# Patient Record
Sex: Female | Born: 1970 | Race: Black or African American | Hispanic: No | Smoking: Never smoker
Health system: Southern US, Community
[De-identification: ages and names within clinical notes are randomized; demographics above are authoritative.]

## PROBLEM LIST (undated history)

## (undated) DIAGNOSIS — G709 Myoneural disorder, unspecified: Secondary | ICD-10-CM

## (undated) DIAGNOSIS — I10 Essential (primary) hypertension: Secondary | ICD-10-CM

## (undated) DIAGNOSIS — F32A Depression, unspecified: Secondary | ICD-10-CM

## (undated) DIAGNOSIS — R51 Headache: Secondary | ICD-10-CM

## (undated) DIAGNOSIS — G35 Multiple sclerosis: Principal | ICD-10-CM

## (undated) DIAGNOSIS — M1990 Unspecified osteoarthritis, unspecified site: Secondary | ICD-10-CM

## (undated) DIAGNOSIS — N319 Neuromuscular dysfunction of bladder, unspecified: Secondary | ICD-10-CM

## (undated) DIAGNOSIS — F329 Major depressive disorder, single episode, unspecified: Secondary | ICD-10-CM

## (undated) DIAGNOSIS — R269 Unspecified abnormalities of gait and mobility: Secondary | ICD-10-CM

## (undated) DIAGNOSIS — R413 Other amnesia: Secondary | ICD-10-CM

## (undated) HISTORY — DX: Headache: R51

## (undated) HISTORY — DX: Unspecified abnormalities of gait and mobility: R26.9

## (undated) HISTORY — DX: Neuromuscular dysfunction of bladder, unspecified: N31.9

## (undated) HISTORY — PX: TUBAL LIGATION: SHX77

## (undated) HISTORY — DX: Multiple sclerosis: G35

## (undated) HISTORY — DX: Other amnesia: R41.3

## (undated) HISTORY — DX: Major depressive disorder, single episode, unspecified: F32.9

## (undated) HISTORY — DX: Depression, unspecified: F32.A

## (undated) NOTE — *Deleted (*Deleted)
Multiple Sclerosis Multiple sclerosis (MS) is a disease of the brain, spinal cord, and optic nerves (central nervous system). It causes the body's disease-fighting (immune) system to destroy the protective covering (myelin sheath) around nerves in the brain. When this happens, signals (nerve impulses) going to and from the brain and spinal cord do not get sent properly or may not get sent at all. There are several types of MS:  Relapsing-remitting MS. This is the most common type. This causes sudden attacks of symptoms. After an attack, you may recover completely until the next attack, or some symptoms may remain permanently.  Secondary progressive MS. This usually develops after the onset of relapsing-remitting MS. Similar to relapsing-remitting MS, this type also causes sudden attacks of symptoms. Attacks may be less frequent, but symptoms slowly get worse (progress) over time.  Primary progressive MS. This causes symptoms that steadily progress over time. This type of MS does not cause sudden attacks of symptoms. The age of onset of MS varies, but it often develops between 20-40 years of age. MS is a lifelong (chronic) condition. There is no cure, but treatment can help slow down the progression of the disease. What are the causes? The cause of this condition is not known. What increases the risk? You are more likely to develop this condition if:  You are a woman.  You have a relative with MS. However, the condition is not passed from parent to child (inherited).  You have a lack (deficiency) of vitamin D.  You smoke. MS is more common in the [place] than in the [place]. What are the signs or symptoms? Relapsing-remitting and secondary progressive MS cause symptoms to occur in episodes or attacks that may last weeks to months. There may be long periods between attacks in which there are almost no symptoms. Primary progressive MS causes symptoms to steadily  progress after they develop. Symptoms of MS vary because of the many different ways it affects the central nervous system. The main symptoms include:  Vision problems and eye pain.  Numbness.  Weakness.  Inability to move your arms, hands, feet, or legs (paralysis).  Balance problems.  Shaking that you cannot control (tremors).  Muscle spasms.  Problems with thinking (cognitive changes). MS can also cause symptoms that are associated with the disease, but are not always the direct result of an MS attack. They may include:  Inability to control urination or bowel movements (incontinence).  Headaches.  Fatigue.  Inability to tolerate heat.  Emotional changes.  Depression.  Pain. How is this diagnosed? This condition is diagnosed based on:  Your symptoms.  A neurological exam. This involves checking central nervous system function, such as nerve function, reflexes, and coordination.  MRIs of the brain and spinal cord.  Lab tests, including a lumbar puncture that tests the fluid that surrounds the brain and spinal cord (cerebrospinal fluid).  Tests to measure the electrical activity of the brain in response to stimulation (evoked potentials). How is this treated? There is no cure for MS, but medicines can help decrease the number and frequency of attacks and help relieve nuisance symptoms. Treatment options may include:  Medicines that reduce the frequency of attacks. These medicines may be given by injection, by mouth (orally), or through an IV.  Medicines that reduce inflammation (steroids). These may provide short-term relief of symptoms.  Medicines to help control pain, depression, fatigue, or incontinence.  Vitamin D, if you have a deficiency.  Using devices to help   you move around (assistive devices), such as braces, a cane, or a walker.  Physical therapy to strengthen and stretch your muscles.  Occupational therapy to help you with everyday  tasks.  Alternative or complementary treatments such as exercise, massage, or acupuncture. Follow these instructions at home:  Take over-the-counter and prescription medicines only as told by your health care provider.  Do not drive or use heavy machinery while taking prescription pain medicine.  Use assistive devices as recommended by your physical therapist or your health care provider.  Exercise as directed by your health care provider.  Return to your normal activities as told by your health care provider. Ask your health care provider what activities are safe for you.  Reach out for support. Share your feelings with friends, family, or a support group.  Keep all follow-up visits as told by your health care provider and therapists. This is important. Where to find more information  National Multiple Sclerosis Society: https://www.nationalmssociety.org Contact a health care provider if:  You feel depressed.  You develop new pain or numbness.  You have tremors.  You have problems with sexual function. Get help right away if:  You develop paralysis.  You develop numbness.  You have problems with your bladder or bowel function.  You develop double vision.  You lose vision in one or both eyes.  You develop suicidal thoughts.  You develop severe confusion. If you ever feel like you may hurt yourself or others, or have thoughts about taking your own life, get help right away. You can go to your nearest emergency department or call:  Your local emergency services (911 in the [place]  A suicide crisis helpline, such as the National Suicide Prevention Lifeline at 1-800-273-8255. This is open 24 hours a day. Summary  Multiple sclerosis (MS) is a disease of the central nervous system that causes the body's immune system to destroy the protective covering (myelin sheath) around nerves in the brain.  There are 3 types of MS: relapsing-remitting, secondary progressive, and  primary progressive. Relapsing-remitting and secondary progressive MS cause symptoms to occur in episodes or attacks that may last weeks to months. Primary progressive MS causes symptoms to steadily progress after they develop.  There is no cure for MS, but medicines can help decrease the number and frequency of attacks and help relieve nuisance symptoms. Treatment may also include physical or occupational therapy.  If you develop numbness, paralysis, vision problems, or other neurological symptoms, get help right away. This information is not intended to replace advice given to you by your health care provider. Make sure you discuss any questions you have with your health care provider. Document Revised: 07/06/2017 Document Reviewed: 10/02/2016 Elsevier Patient Education  2020 [employer]  

---

## 1990-08-07 DIAGNOSIS — G35 Multiple sclerosis: Secondary | ICD-10-CM

## 1990-08-07 HISTORY — DX: Multiple sclerosis: G35

## 2001-08-07 DIAGNOSIS — R519 Headache, unspecified: Secondary | ICD-10-CM

## 2001-08-07 HISTORY — DX: Headache, unspecified: R51.9

## 2002-08-07 HISTORY — PX: ABLATION COLPOCLESIS: SHX1118

## 2011-08-18 ENCOUNTER — Other Ambulatory Visit (INDEPENDENT_AMBULATORY_CARE_PROVIDER_SITE_OTHER): Payer: BC Managed Care – PPO

## 2011-08-18 ENCOUNTER — Ambulatory Visit (INDEPENDENT_AMBULATORY_CARE_PROVIDER_SITE_OTHER): Payer: BC Managed Care – PPO | Admitting: Internal Medicine

## 2011-08-18 ENCOUNTER — Encounter: Payer: Self-pay | Admitting: Internal Medicine

## 2011-08-18 VITALS — BP 122/76 | HR 64 | Temp 98.1°F | Resp 16 | Wt 202.0 lb

## 2011-08-18 DIAGNOSIS — Z113 Encounter for screening for infections with a predominantly sexual mode of transmission: Secondary | ICD-10-CM | POA: Insufficient documentation

## 2011-08-18 DIAGNOSIS — Z1231 Encounter for screening mammogram for malignant neoplasm of breast: Secondary | ICD-10-CM

## 2011-08-18 DIAGNOSIS — Z23 Encounter for immunization: Secondary | ICD-10-CM

## 2011-08-18 DIAGNOSIS — Z0000 Encounter for general adult medical examination without abnormal findings: Secondary | ICD-10-CM

## 2011-08-18 LAB — LIPID PANEL
LDL Cholesterol: 103 mg/dL — ABNORMAL HIGH (ref 0–99)
Total CHOL/HDL Ratio: 3
Triglycerides: 112 mg/dL (ref 0.0–149.0)

## 2011-08-18 LAB — CBC WITH DIFFERENTIAL/PLATELET
Basophils Absolute: 0 10*3/uL (ref 0.0–0.1)
Eosinophils Relative: 1.6 % (ref 0.0–5.0)
Hemoglobin: 12.1 g/dL (ref 12.0–15.0)
Lymphocytes Relative: 13.5 % (ref 12.0–46.0)
Monocytes Relative: 14.1 % — ABNORMAL HIGH (ref 3.0–12.0)
Neutro Abs: 1.8 10*3/uL (ref 1.4–7.7)
RBC: 3.79 Mil/uL — ABNORMAL LOW (ref 3.87–5.11)
RDW: 14.4 % (ref 11.5–14.6)
WBC: 2.6 10*3/uL — ABNORMAL LOW (ref 4.5–10.5)

## 2011-08-18 LAB — COMPREHENSIVE METABOLIC PANEL
ALT: 33 U/L (ref 0–35)
CO2: 28 mEq/L (ref 19–32)
Calcium: 9.5 mg/dL (ref 8.4–10.5)
Chloride: 107 mEq/L (ref 96–112)
Creatinine, Ser: 1.1 mg/dL (ref 0.4–1.2)
GFR: 61.01 mL/min (ref >60.00)
Total Protein: 6.7 g/dL (ref 6.0–8.3)

## 2011-08-18 LAB — TSH: TSH: 1.8 u[IU]/mL (ref 0.35–5.50)

## 2011-08-18 NOTE — Assessment & Plan Note (Signed)
Exam done, labs ordered, referred for a mammogram, pt ed material was given, vaccines were updated

## 2011-08-18 NOTE — Progress Notes (Signed)
  Subjective:    Patient ID: Connie Robertson, female    DOB: 05/25/1971, 41 y.o.   MRN: [id number]  HPI New to me for a physical, she has multiple symptoms as a result of MS but she does not have ane new or different symptoms today.   Review of Systems  Constitutional: Positive for fatigue and unexpected weight change (weight gain). Negative for fever, chills, activity change and appetite change.  HENT: Negative.   Eyes: Negative.   Respiratory: Negative for cough, chest tightness, shortness of breath, wheezing and stridor.   Cardiovascular: Negative for chest pain, palpitations and leg swelling.  Gastrointestinal: Negative.   Genitourinary: Negative.   Musculoskeletal: Negative.   Neurological: Positive for numbness. Negative for dizziness, tremors, seizures, syncope, facial asymmetry, speech difficulty, weakness, light-headedness and headaches.  Hematological: Negative for adenopathy. Does not bruise/bleed easily.  Psychiatric/Behavioral: Negative.        Objective:   Physical Exam  Vitals reviewed. Constitutional: She is oriented to person, place, and time. She appears well-developed and well-nourished. No distress.  HENT:  Head: Normocephalic and atraumatic.  Nose: Nose normal.  Mouth/Throat: Oropharynx is clear and moist. No oropharyngeal exudate.  Eyes: Conjunctivae are normal. Right eye exhibits no discharge. Left eye exhibits no discharge. No scleral icterus.  Neck: Normal range of motion. Neck supple. No JVD present. No tracheal deviation present. No thyromegaly present.  Cardiovascular: Normal rate, regular rhythm, normal heart sounds and intact distal pulses.  Exam reveals no gallop and no friction rub.   No murmur heard. Pulmonary/Chest: Effort normal and breath sounds normal. No accessory muscle usage or stridor. Not tachypneic. No respiratory distress. She has no decreased breath sounds. She has no wheezes. She has no rhonchi. She has no rales. She exhibits no  tenderness. Right breast exhibits no inverted nipple, no mass, no nipple discharge, no skin change and no tenderness. Left breast exhibits no inverted nipple, no mass, no nipple discharge, no skin change and no tenderness. Breasts are symmetrical.  Abdominal: Soft. Bowel sounds are normal. She exhibits no distension and no mass. There is no tenderness. There is no rebound and no guarding.  Musculoskeletal: Normal range of motion. She exhibits no edema and no tenderness.  Lymphadenopathy:    She has no cervical adenopathy.  Neurological: She is oriented to person, place, and time.  Skin: Skin is warm and dry. No rash noted. She is not diaphoretic. No erythema. No pallor.  Psychiatric: She has a normal mood and affect. Her behavior is normal. Judgment and thought content normal.          Assessment & Plan:

## 2011-08-18 NOTE — Patient Instructions (Signed)

## 2011-08-19 ENCOUNTER — Encounter: Payer: Self-pay | Admitting: Internal Medicine

## 2011-09-19 ENCOUNTER — Emergency Department (INDEPENDENT_AMBULATORY_CARE_PROVIDER_SITE_OTHER)
Admission: EM | Admit: 2011-09-19 | Discharge: 2011-09-19 | Disposition: A | Discharge status: Home / Self Care | Payer: BC Managed Care – PPO | Source: Home / Self Care | Attending: Family Medicine | Admitting: Family Medicine

## 2011-09-19 ENCOUNTER — Encounter (HOSPITAL_COMMUNITY): Payer: Self-pay | Admitting: *Deleted

## 2011-09-19 DIAGNOSIS — I10 Essential (primary) hypertension: Secondary | ICD-10-CM

## 2011-09-19 MED ORDER — HYDROCODONE-ACETAMINOPHEN 5-500 MG PO TABS: 1.0000 | ORAL_TABLET | Freq: Four times a day (QID) | ORAL | Status: AC | PRN

## 2011-09-19 NOTE — ED Provider Notes (Signed)
History     CSN: [id number]  Arrival date & time 09/19/11  1324   First MD Initiated Contact with Patient 09/19/11 2045      Chief Complaint  Patient presents with  . Hypertension    (Consider location/radiation/quality/duration/timing/severity/associated sxs/prior treatment) HPI Comments:  41 year old female with history of multiple sclerosis and depression here concerned as she was told her blood pressure was high when she tried to donate blood today. Has never been diagnosed with high blood pressure before and has never taken high blood pressure medications in the past. Denies new medications currently taking fingolimod prescribed by Dr. Sandria Manly  her neurologist in town for multiple sclerosis. Reports she has felt lightheaded and has had diffuse headache today. But that is not uncommon for her who has a history of recurrent headaches. Reports that no medications help her with her pain and her neurologist has tried several combinations including narcotics and she has high tolerance for pain medications. She is not interested in pain medicine prescription here. Denies numbness paresthesia in her extremities from what is baseline for her denies weakness balance problems, or visual changes. Denies nausea or vomiting. Denies chest pain leg swelling or PND.   Past Medical History  Diagnosis Date  . MS (multiple sclerosis) 1992    Dr. Sandria Manly  . Depression     Past Surgical History  Procedure Date  . Tubal ligation   . Ablation colpoclesis 2004    Family History  Problem Relation Age of Onset  . Heart failure Father   . Multiple sclerosis Sister   . Cancer Neg Hx   . Diabetes Neg Hx   . Hyperlipidemia Neg Hx   . Hypertension Neg Hx   . Kidney disease Neg Hx   . Stroke Neg Hx     History  Substance Use Topics  . Smoking status: Never Smoker   . Smokeless tobacco: Never Used  . Alcohol Use: 4.2 oz/week    7 Glasses of wine per week    OB History    Grav Para Term Preterm  Abortions TAB SAB Ect Mult Living                  Review of Systems  Constitutional: Negative for fever and appetite change.  HENT: Negative for tinnitus.   Respiratory: Negative for cough and shortness of breath.   Cardiovascular: Negative for chest pain, palpitations and leg swelling.  Genitourinary: Negative for dysuria.  Neurological: Positive for headaches. Negative for dizziness, tremors, seizures, syncope, facial asymmetry, speech difficulty, weakness, light-headedness and numbness.    Allergies  Review of patient's allergies indicates no known allergies.  Home Medications   Current Outpatient Rx  Name Route Sig Dispense Refill  . GILENYA PO Oral Take by mouth.      BP 151/101  Pulse 64  Temp(Src) 98.7 F (37.1 C) (Oral)  Resp 18  SpO2 100%  Physical Exam  Nursing note and vitals reviewed. Constitutional: She is oriented to person, place, and time. She appears well-developed and well-nourished. No distress.  HENT:  Head: Normocephalic and atraumatic.  Eyes: Conjunctivae and EOM are normal. Pupils are equal, round, and reactive to light.  Neck: No JVD present.  Cardiovascular: Normal heart sounds.   Pulmonary/Chest: Breath sounds normal.  Neurological: She is alert and oriented to person, place, and time. She has normal strength. She displays normal reflexes. No cranial nerve deficit. She exhibits normal muscle tone. She displays a negative Romberg sign. Coordination normal.  Visual fields normal compared with mine.    ED Course  Procedures (including critical care time)  Labs Reviewed - No data to display No results found.   1. High blood pressure       MDM  Stage I HTN values today, Reccommended low sodium diet, weight management, reestablish care with a PCP to monitor blood pressure patient has been seen in the past by Dr. Kriste Basque.         Sharin Grave, MD 09/21/11 1149

## 2011-09-19 NOTE — ED Notes (Signed)
pT  REPORTS  SHE  WAS  TOLD  HER BP  WAS  HIGH  WHEN SHE  TRIED  TO DONATE  BLOOD  TODAY  SHE  HAS  MS      -  SHE  REPORTS  FEELINHG  LIGHTHEADED  AND  DIZZY    SHE  IS  AWAKE  AND  ALERT    AND  ORIENTED      AMBULATED S  WITH A  FLUID  STEADY GAIT

## 2011-09-20 ENCOUNTER — Encounter: Payer: Self-pay | Admitting: Internal Medicine

## 2011-09-20 ENCOUNTER — Ambulatory Visit (INDEPENDENT_AMBULATORY_CARE_PROVIDER_SITE_OTHER): Payer: BC Managed Care – PPO | Admitting: Internal Medicine

## 2011-09-20 VITALS — BP 138/90 | HR 67 | Temp 98.1°F | Resp 16 | Wt 211.0 lb

## 2011-09-20 DIAGNOSIS — I10 Essential (primary) hypertension: Secondary | ICD-10-CM

## 2011-09-20 MED ORDER — NEBIVOLOL HCL 5 MG PO TABS: 5.0000 mg | ORAL_TABLET | Freq: Every day | ORAL | Status: DC

## 2011-09-20 NOTE — Progress Notes (Signed)
  Subjective:    Patient ID: Connie Robertson, female    DOB: 03/15/1971, 41 y.o.   MRN: [id number]  Hypertension This is a chronic problem. The current episode started more than 1 month ago. The problem is unchanged. The problem is uncontrolled. Associated symptoms include headaches. Pertinent negatives include no anxiety, blurred vision, chest pain, malaise/fatigue, neck pain, orthopnea, palpitations, peripheral edema, PND, shortness of breath or sweats. There are no associated agents to hypertension. Past treatments include nothing. There are no compliance problems.       Review of Systems  Constitutional: Negative for fever, chills, malaise/fatigue, diaphoresis, activity change, appetite change, fatigue and unexpected weight change.  HENT: Negative for neck pain.   Eyes: Negative.  Negative for blurred vision.  Respiratory: Negative for cough, choking, chest tightness, shortness of breath, wheezing and stridor.   Cardiovascular: Negative for chest pain, palpitations, orthopnea, leg swelling and PND.  Gastrointestinal: Negative.   Genitourinary: Negative.   Musculoskeletal: Negative.   Skin: Negative.   Neurological: Positive for headaches. Negative for dizziness, tremors, seizures, syncope, facial asymmetry, speech difficulty and light-headedness.  Hematological: Negative for adenopathy. Does not bruise/bleed easily.  Psychiatric/Behavioral: Negative.        Objective:   Physical Exam  Vitals reviewed. Constitutional: She is oriented to person, place, and time. She appears well-developed and well-nourished. No distress.  HENT:  Head: Normocephalic and atraumatic.  Mouth/Throat: Oropharynx is clear and moist. No oropharyngeal exudate.  Eyes: Conjunctivae are normal. Right eye exhibits no discharge. Left eye exhibits no discharge. No scleral icterus.  Neck: Normal range of motion. Neck supple. No JVD present. No tracheal deviation present. No thyromegaly present.  Cardiovascular:  Normal rate, regular rhythm and intact distal pulses.  Exam reveals no gallop and no friction rub.   No murmur heard. Pulmonary/Chest: Effort normal and breath sounds normal. No stridor. No respiratory distress. She has no wheezes. She has no rales. She exhibits no tenderness.  Abdominal: Soft. Bowel sounds are normal. She exhibits no distension and no mass. There is no tenderness. There is no rebound and no guarding.  Musculoskeletal: Normal range of motion. She exhibits no edema and no tenderness.  Lymphadenopathy:    She has no cervical adenopathy.  Neurological: She is oriented to person, place, and time.  Skin: Skin is warm and dry. No rash noted. She is not diaphoretic. No erythema. No pallor.  Psychiatric: She has a normal mood and affect. Her behavior is normal. Judgment and thought content normal.      Lab Results  Component Value Date   WBC 2.6* 08/18/2011   HGB 12.1 08/18/2011   HCT 34.7* 08/18/2011   PLT 163.0 08/18/2011   GLUCOSE 91 08/18/2011   CHOL 191 08/18/2011   TRIG 112.0 08/18/2011   HDL 66.10 08/18/2011   LDLCALC 103* 08/18/2011   ALT 33 08/18/2011   AST 28 08/18/2011   NA 142 08/18/2011   K 4.3 08/18/2011   CL 107 08/18/2011   CREATININE 1.1 08/18/2011   BUN 19 08/18/2011   CO2 28 08/18/2011   TSH 1.80 08/18/2011      Assessment & Plan:

## 2011-09-20 NOTE — Patient Instructions (Signed)

## 2011-09-21 ENCOUNTER — Encounter: Payer: Self-pay | Admitting: Internal Medicine

## 2011-09-21 ENCOUNTER — Ambulatory Visit: Payer: BC Managed Care – PPO | Admitting: Internal Medicine

## 2011-09-21 NOTE — Assessment & Plan Note (Signed)
Start bystolic 

## 2011-09-22 ENCOUNTER — Ambulatory Visit: Payer: BC Managed Care – PPO

## 2011-09-28 ENCOUNTER — Ambulatory Visit (INDEPENDENT_AMBULATORY_CARE_PROVIDER_SITE_OTHER): Payer: BC Managed Care – PPO | Admitting: Endocrinology

## 2011-09-28 ENCOUNTER — Encounter: Payer: Self-pay | Admitting: Endocrinology

## 2011-09-28 DIAGNOSIS — I10 Essential (primary) hypertension: Secondary | ICD-10-CM

## 2011-09-28 NOTE — Progress Notes (Signed)
  Subjective:    Patient ID: Connie Robertson, female    DOB: [birth year], 41 y.o.   MRN: [id number]  HPI Pt was recently dx'ed with HTN.  She takes bystolic as rx'ed.  Denies sob.   Past Medical History  Diagnosis Date  . MS (multiple sclerosis) 1992    Dr. Sandria Manly  . Depression   . Chronic daily headache 2003    Love    Past Surgical History  Procedure Date  . Tubal ligation   . Ablation colpoclesis 2004    History   Social History  . Marital Status: Married    Spouse Name: N/A    Number of Children: N/A  . Years of Education: N/A   Occupational History  . Not on file.   Social History Main Topics  . Smoking status: Never Smoker   . Smokeless tobacco: Never Used  . Alcohol Use: 4.2 oz/week    7 Glasses of wine per week  . Drug Use: No  . Sexually Active: Not Currently    Birth Control/ Protection: Surgical   Other Topics Concern  . Not on file   Social History Narrative  . No narrative on file    Current Outpatient Prescriptions on File Prior to Visit  Medication Sig Dispense Refill  . Fingolimod HCl (GILENYA PO) Take by mouth.      Marland Kitchen HYDROcodone-acetaminophen (VICODIN) 5-500 MG per tablet Take 1-2 tablets by mouth every 6 (six) hours as needed for pain.  10 tablet  0  . nebivolol (BYSTOLIC) 5 MG tablet Take 1 tablet (5 mg total) by mouth daily.  70 tablet  0    No Known Allergies  Family History  Problem Relation Age of Onset  . Heart failure Father   . Multiple sclerosis Sister   . Cancer Neg Hx   . Diabetes Neg Hx   . Hyperlipidemia Neg Hx   . Hypertension Neg Hx   . Kidney disease Neg Hx   . Stroke Neg Hx     BP 142/88  Pulse 108  Temp(Src) 98.3 F (36.8 C) (Oral)  Ht 5\' 5"  (1.651 m)  Wt 212 lb 12.8 oz (96.525 kg)  BMI 35.41 kg/m2  SpO2 99%   Review of Systems Denies chest pain    Objective:   Physical Exam VITAL SIGNS:  See vs page GENERAL: no distress Ext: no edema     Assessment & Plan:  HTN, needs increased rx

## 2011-09-28 NOTE — Patient Instructions (Addendum)
Increase bystolic to 10 mg daily.   Please see dr Yetta Barre in 2 weeks.

## 2011-10-10 ENCOUNTER — Ambulatory Visit: Payer: BC Managed Care – PPO

## 2011-10-12 ENCOUNTER — Ambulatory Visit: Payer: BC Managed Care – PPO | Admitting: Internal Medicine

## 2011-10-13 ENCOUNTER — Ambulatory Visit: Payer: BC Managed Care – PPO | Admitting: Internal Medicine

## 2011-10-13 DIAGNOSIS — Z0289 Encounter for other administrative examinations: Secondary | ICD-10-CM

## 2011-10-18 ENCOUNTER — Ambulatory Visit: Payer: BC Managed Care – PPO

## 2011-11-17 ENCOUNTER — Ambulatory Visit (INDEPENDENT_AMBULATORY_CARE_PROVIDER_SITE_OTHER): Payer: BC Managed Care – PPO | Admitting: Internal Medicine

## 2011-11-17 ENCOUNTER — Encounter: Payer: Self-pay | Admitting: Internal Medicine

## 2011-11-17 VITALS — BP 120/84 | HR 82 | Temp 98.8°F | Resp 16 | Wt 216.0 lb

## 2011-11-17 DIAGNOSIS — R232 Flushing: Secondary | ICD-10-CM

## 2011-11-17 DIAGNOSIS — I10 Essential (primary) hypertension: Secondary | ICD-10-CM

## 2011-11-17 DIAGNOSIS — G44221 Chronic tension-type headache, intractable: Secondary | ICD-10-CM | POA: Insufficient documentation

## 2011-11-17 DIAGNOSIS — G44229 Chronic tension-type headache, not intractable: Secondary | ICD-10-CM

## 2011-11-17 DIAGNOSIS — N951 Menopausal and female climacteric states: Secondary | ICD-10-CM

## 2011-11-17 MED ORDER — NORTRIPTYLINE HCL 10 MG PO CAPS: 10.0000 mg | ORAL_CAPSULE | Freq: Every day | ORAL | Status: DC

## 2011-11-17 NOTE — Progress Notes (Signed)
Subjective:    Patient ID: Connie Robertson, female    DOB: [birth year], 41 y.o.   MRN: [id number]  Hypertension This is a chronic problem. The current episode started more than 1 year ago. The problem has been gradually improving since onset. Associated symptoms include headaches. Pertinent negatives include no anxiety, blurred vision, chest pain, malaise/fatigue, neck pain, orthopnea, palpitations, peripheral edema, PND, shortness of breath or sweats. Past treatments include beta blockers. The current treatment provides significant improvement. Compliance problems include exercise and diet.   Headache  This is a chronic problem. Episode onset: 10 years ago. The problem occurs daily. The problem has been unchanged. The pain is located in the bilateral, temporal and frontal region. The pain does not radiate. The pain quality is similar to prior headaches. The quality of the pain is described as squeezing. The pain is at a severity of 2/10. The pain is mild. Associated symptoms include insomnia. Pertinent negatives include no abdominal pain, abnormal behavior, anorexia, back pain, blurred vision, coughing, dizziness, drainage, ear pain, eye pain, eye redness, facial sweating, fever, hearing loss, loss of balance, muscle aches, nausea, neck pain, numbness, phonophobia, photophobia, rhinorrhea, scalp tenderness, seizures, sinus pressure, sore throat, swollen glands, tingling, tinnitus, visual change, vomiting, weakness or weight loss. The symptoms are aggravated by emotional stress. Treatments tried: many, given to her by a neurologist, she does know any of them today. The treatment provided no relief. Her past medical history is significant for hypertension.      Review of Systems  Constitutional: Positive for chills (hot flashes). Negative for fever, weight loss, malaise/fatigue, diaphoresis, activity change, appetite change, fatigue and unexpected weight change.  HENT: Negative for hearing loss, ear  pain, congestion, sore throat, facial swelling, rhinorrhea, neck pain, neck stiffness, sinus pressure and tinnitus.   Eyes: Negative.  Negative for blurred vision, photophobia, pain and redness.  Respiratory: Negative for apnea, cough, choking, chest tightness, shortness of breath, wheezing and stridor.   Cardiovascular: Negative for chest pain, palpitations, orthopnea and PND.  Gastrointestinal: Negative for nausea, vomiting, abdominal pain and anorexia.  Musculoskeletal: Negative for myalgias, back pain, joint swelling, arthralgias and gait problem.  Skin: Negative for color change, pallor, rash and wound.  Neurological: Positive for headaches. Negative for dizziness, tingling, tremors, seizures, syncope, facial asymmetry, speech difficulty, weakness, light-headedness, numbness and loss of balance.  Hematological: Negative for adenopathy. Does not bruise/bleed easily.  Psychiatric/Behavioral: Positive for sleep disturbance. Negative for suicidal ideas, hallucinations, behavioral problems, confusion, self-injury, dysphoric mood, decreased concentration and agitation. The patient has insomnia. The patient is not nervous/anxious and is not hyperactive.        Objective:   Physical Exam  Vitals reviewed. Constitutional: She is oriented to person, place, and time. She appears well-developed and well-nourished. No distress.  HENT:  Head: Normocephalic and atraumatic.  Mouth/Throat: Oropharynx is clear and moist. No oropharyngeal exudate.  Eyes: Conjunctivae and EOM are normal. Pupils are equal, round, and reactive to light. Right eye exhibits no discharge. Left eye exhibits no discharge. No scleral icterus.  Neck: Normal range of motion. Neck supple. No JVD present. No tracheal deviation present. No thyromegaly present.  Cardiovascular: Normal rate, normal heart sounds and intact distal pulses.  Exam reveals no gallop and no friction rub.   No murmur heard. Pulmonary/Chest: Effort normal and  breath sounds normal. No stridor. No respiratory distress. She has no wheezes. She has no rales. She exhibits no tenderness.  Abdominal: Soft. Bowel sounds are normal. She exhibits no distension and  no mass. There is no tenderness. There is no rebound and no guarding.  Musculoskeletal: Normal range of motion. She exhibits no edema and no tenderness.  Lymphadenopathy:    She has no cervical adenopathy.  Neurological: She is alert and oriented to person, place, and time. She has normal reflexes. She displays normal reflexes. No cranial nerve deficit. She exhibits normal muscle tone. Coordination normal.  Skin: Skin is warm and dry. No rash noted. She is not diaphoretic. No erythema. No pallor.  Psychiatric: She has a normal mood and affect. Her behavior is normal. Judgment and thought content normal.      Lab Results  Component Value Date   WBC 2.6* 08/18/2011   HGB 12.1 08/18/2011   HCT 34.7* 08/18/2011   PLT 163.0 08/18/2011   GLUCOSE 91 08/18/2011   CHOL 191 08/18/2011   TRIG 112.0 08/18/2011   HDL 66.10 08/18/2011   LDLCALC 103* 08/18/2011   ALT 33 08/18/2011   AST 28 08/18/2011   NA 142 08/18/2011   K 4.3 08/18/2011   CL 107 08/18/2011   CREATININE 1.1 08/18/2011   BUN 19 08/18/2011   CO2 28 08/18/2011   TSH 1.80 08/18/2011      Assessment & Plan:

## 2011-11-17 NOTE — Assessment & Plan Note (Signed)
I will check her labs today to look for menopause, thyroid disease, inflammation, anemia, etc.

## 2011-11-17 NOTE — Patient Instructions (Signed)
Hypertension As your heart beats, it forces blood through your arteries. This force is your blood pressure. If the pressure is too high, it is called hypertension (HTN) or high blood pressure. HTN is dangerous because you may have it and not know it. High blood pressure may mean that your heart has to work harder to pump blood. Your arteries may be narrow or stiff. The extra work puts you at risk for heart disease, stroke, and other problems.  Blood pressure consists of two numbers, a higher number over a lower, 110/72, for example. It is stated as "110 over 72." The ideal is below 120 for the top number (systolic) and under 80 for the bottom (diastolic). Write down your blood pressure today. You should pay close attention to your blood pressure if you have certain conditions such as:  Heart failure.   Prior heart attack.   Diabetes   Chronic kidney disease.   Prior stroke.   Multiple risk factors for heart disease.  To see if you have HTN, your blood pressure should be measured while you are seated with your arm held at the level of the heart. It should be measured at least twice. A one-time elevated blood pressure reading (especially in the Emergency Department) does not mean that you need treatment. There may be conditions in which the blood pressure is different between your right and left arms. It is important to see your caregiver soon for a recheck. Most people have essential hypertension which means that there is not a specific cause. This type of high blood pressure may be lowered by changing lifestyle factors such as:  Stress.   Smoking.   Lack of exercise.   Excessive weight.   Drug/tobacco/alcohol use.   Eating less salt.  Most people do not have symptoms from high blood pressure until it has caused damage to the body. Effective treatment can often prevent, delay or reduce that damage. TREATMENT  When a cause has been identified, treatment for high blood pressure is  directed at the cause. There are a large number of medications to treat HTN. These fall into several categories, and your caregiver will help you select the medicines that are best for you. Medications may have side effects. You should review side effects with your caregiver. If your blood pressure stays high after you have made lifestyle changes or started on medicines,   Your medication(s) may need to be changed.   Other problems may need to be addressed.   Be certain you understand your prescriptions, and know how and when to take your medicine.   Be sure to follow up with your caregiver within the time frame advised (usually within two weeks) to have your blood pressure rechecked and to review your medications.   If you are taking more than one medicine to lower your blood pressure, make sure you know how and at what times they should be taken. Taking two medicines at the same time can result in blood pressure that is too low.  SEEK IMMEDIATE MEDICAL CARE IF:  You develop a severe headache, blurred or changing vision, or confusion.   You have unusual weakness or numbness, or a faint feeling.   You have severe chest or abdominal pain, vomiting, or breathing problems.  MAKE SURE YOU:   Understand these instructions.   Will watch your condition.   Will get help right away if you are not doing well or get worse.  Document Released: 07/24/2005 Document Revised: 07/13/2011 Document Reviewed:   03/13/2008 [employer] Patient Information 2012 [place], [place].Tension Headache (Muscle Contraction Headache) Tension headache is one of the most common causes of head pain. These headaches are usually felt as a pain over the top of your head and back of your neck. Stress, anxiety, and depression are common triggers for these headaches. Tension headaches are not life-threatening and will not lead to other types of headaches. Tension headaches can often be diagnosed by taking a history from the patient and  a physical exam. Sometimes, further lab and x-ray studies are used to confirm the diagnosis. Your caregiver can advise you on how to get help solving problems that cause anxiety or stress. Antidepressants can be prescribed if depression is a problem. HOME CARE INSTRUCTIONS   If testing was done, call for your results. Remember, it is your responsibility to get the results of all testing. Do not assume everything is fine because you do not hear from your caregiver.   Only take over-the-counter or prescription medicines for pain, discomfort, or fever as directed by your caregiver.   Biofeedback, massage, or other relaxation techniques may be helpful.   Ice packs or heat to the head and neck can be used. Use these three to four times per day or as needed.   Physical therapy may be a useful addition to treatment.   If headaches continue, even with therapy, you may need to think about lifestyle changes.   Avoid excessive use of pain killers, as rebound headaches can occur.  SEEK MEDICAL CARE IF:   You develop problems with medications prescribed.   You do not respond or get no relief from medications.   You have a change from the usual headache.   You develop nausea (feeling sick to your stomach) or vomiting.  SEEK IMMEDIATE MEDICAL CARE IF:   Your headache becomes severe.   You have an unexplained oral temperature above 102 F (38.9 C).   You develop a stiff neck.   You have loss of vision.   You have muscular weakness.   You have loss of muscular control.   You develop severe symptoms different from your first symptoms.   You start losing your balance or have trouble walking.   You feel faint or pass out.  MAKE SURE YOU:   Understand these instructions.   Will watch your condition.   Will get help right away if you are not doing well or get worse.  Document Released: 07/24/2005 Document Revised: 07/13/2011 Document Reviewed: 03/12/2008 [facility] Patient Information  2012 [place], [place].

## 2011-11-17 NOTE — Assessment & Plan Note (Signed)
Her BP is well controlled, I will check her lytes and renal function today 

## 2011-11-17 NOTE — Assessment & Plan Note (Signed)
Labs today to look for secondary causes and I have asked her to start pamelor for the HA

## 2011-12-29 ENCOUNTER — Ambulatory Visit: Payer: BC Managed Care – PPO | Admitting: Internal Medicine

## 2011-12-29 DIAGNOSIS — Z0289 Encounter for other administrative examinations: Secondary | ICD-10-CM

## 2012-11-12 ENCOUNTER — Emergency Department (INDEPENDENT_AMBULATORY_CARE_PROVIDER_SITE_OTHER)
Admission: EM | Admit: 2012-11-12 | Discharge: 2012-11-12 | Disposition: A | Discharge status: Home / Self Care | Payer: Medicare Other | Source: Home / Self Care | Attending: Emergency Medicine | Admitting: Emergency Medicine

## 2012-11-12 ENCOUNTER — Encounter (HOSPITAL_COMMUNITY): Payer: Self-pay | Admitting: *Deleted

## 2012-11-12 DIAGNOSIS — R300 Dysuria: Secondary | ICD-10-CM

## 2012-11-12 DIAGNOSIS — N390 Urinary tract infection, site not specified: Secondary | ICD-10-CM

## 2012-11-12 LAB — POCT URINALYSIS DIP (DEVICE)
Bilirubin Urine: NEGATIVE
Glucose, UA: NEGATIVE mg/dL
Hgb urine dipstick: NEGATIVE
Ketones, ur: NEGATIVE mg/dL
Nitrite: POSITIVE — AB
pH: 6 (ref 5.0–8.0)

## 2012-11-12 MED ORDER — CEPHALEXIN 500 MG PO CAPS: 500.0000 mg | ORAL_CAPSULE | Freq: Three times a day (TID) | ORAL | Status: AC

## 2012-11-12 NOTE — ED Notes (Signed)
Pt  Reports  Symptoms  Of  painfull  Urination          With  Frequency  At night  Slight  Blood  As  Well  As  A  Foul  Odor       Symptoms  X  2days  denys  Any  Discharge

## 2012-11-12 NOTE — ED Provider Notes (Signed)
History     CSN: [id number]  Arrival date & time 11/12/12  1157   First MD Initiated Contact with Patient 11/12/12 1318      Chief Complaint  Patient presents with  . Urinary Frequency    (Consider location/radiation/quality/duration/timing/severity/associated sxs/prior treatment) HPI Comments: Patient presents to urgent care describing feeling pressure burning pain with urination for the last 2 days. He has been noticing a or 2 here and on days prior to that. Patient denies any fevers flank pain nausea vomiting or changes in appetite. Has been drinking a lot of fluids and cranberry juice with no good results.  Patient is a 42 y.o. female presenting with frequency. The history is provided by the patient.  Urinary Frequency This is a new problem. The current episode started 2 days ago. The problem occurs constantly. The problem has not changed since onset.Pertinent negatives include no shortness of breath. Exacerbated by: Urinating. Nothing relieves the symptoms. She has tried nothing for the symptoms. The treatment provided no relief.    Past Medical History  Diagnosis Date  . MS (multiple sclerosis) 1992    Dr. Sandria Manly  . Depression   . Chronic daily headache 2003    [facility]    Past Surgical History  Procedure Laterality Date  . Tubal ligation    . Ablation colpoclesis  2004    Family History  Problem Relation Age of Onset  . Heart failure Father   . Multiple sclerosis Sister   . Cancer Neg Hx   . Diabetes Neg Hx   . Hyperlipidemia Neg Hx   . Hypertension Neg Hx   . Kidney disease Neg Hx   . Stroke Neg Hx     History  Substance Use Topics  . Smoking status: Never Smoker   . Smokeless tobacco: Never Used  . Alcohol Use: 4.2 oz/week    7 Glasses of wine per week    OB History   Grav Para Term Preterm Abortions TAB SAB Ect Mult Living                  Review of Systems  Respiratory: Negative for shortness of breath.   Genitourinary: Positive for dysuria and  frequency. Negative for urgency, hematuria, flank pain, decreased urine volume, vaginal bleeding, vaginal discharge, enuresis, menstrual problem and pelvic pain.  Musculoskeletal: Negative for back pain, arthralgias and gait problem.  Skin: Negative for rash.    Allergies  Review of patient's allergies indicates no known allergies.  Home Medications   Current Outpatient Rx  Name  Route  Sig  Dispense  Refill  . cephALEXin (KEFLEX) 500 MG capsule   Oral   Take 1 capsule (500 mg total) by mouth 3 (three) times daily.   21 capsule   0   . Fingolimod HCl (GILENYA PO)   Oral   Take by mouth.         . nebivolol (BYSTOLIC) 10 MG tablet   Oral   Take 10 mg by mouth daily.         . nortriptyline (PAMELOR) 10 MG capsule   Oral   Take 1 capsule (10 mg total) by mouth at bedtime.   90 capsule   1     BP 148/92  Pulse 78  Temp(Src) 99.3 F (37.4 C) (Oral)  Resp 16  SpO2 100%  Physical Exam  Constitutional: Vital signs are normal. She appears well-developed and well-nourished.  HENT:  Head: Normocephalic.  Mouth/Throat: No oropharyngeal exudate.  Abdominal: Soft.  She exhibits no distension and no mass. There is tenderness in the suprapubic area. There is no rebound, no guarding and no CVA tenderness.  Neurological: She is alert.  Skin: No rash noted. No erythema.    ED Course  Procedures (including critical care time)  Labs Reviewed  POCT URINALYSIS DIP (DEVICE) - Abnormal; Notable for the following:    Protein, ur 30 (*)    Nitrite POSITIVE (*)    Leukocytes, UA SMALL (*)    All other components within normal limits  URINE CULTURE  POCT PREGNANCY, URINE   No results found.   1. Dysuria   2. Urinary tract infection      Urine culture ordered MDM  Uncomplicated urinary tract infection. Patient has been prescribed a course of Keflex for 7 days. Instructed patient to return if any red flag symptoms were worsening symptoms or no improvement after 3  days.        Jimmie Molly, MD 11/12/12 [phone]

## 2012-11-14 LAB — URINE CULTURE: Colony Count: >=100000

## 2012-11-14 NOTE — ED Notes (Signed)
Urine culture: >100,000 colonies E. Coli.  Pt. adequately treated with Keflex. Vassie Moselle 11/14/2012

## 2012-12-16 ENCOUNTER — Emergency Department (HOSPITAL_COMMUNITY)
Admission: EM | Admit: 2012-12-16 | Discharge: 2012-12-16 | Disposition: A | Discharge status: Home / Self Care | Payer: Medicare Other | Source: Home / Self Care

## 2013-01-28 ENCOUNTER — Encounter (HOSPITAL_COMMUNITY): Payer: Self-pay | Admitting: Emergency Medicine

## 2013-01-28 ENCOUNTER — Emergency Department (INDEPENDENT_AMBULATORY_CARE_PROVIDER_SITE_OTHER)
Admission: EM | Admit: 2013-01-28 | Discharge: 2013-01-28 | Disposition: A | Discharge status: Home / Self Care | Payer: Managed Care, Other (non HMO) | Source: Home / Self Care

## 2013-01-28 DIAGNOSIS — N390 Urinary tract infection, site not specified: Secondary | ICD-10-CM

## 2013-01-28 LAB — POCT URINALYSIS DIP (DEVICE)
Hgb urine dipstick: NEGATIVE
Ketones, ur: NEGATIVE mg/dL
Protein, ur: NEGATIVE mg/dL
Specific Gravity, Urine: >=1.03 (ref 1.005–1.030)
Urobilinogen, UA: 0.2 mg/dL (ref 0.0–1.0)
pH: 5.5 (ref 5.0–8.0)

## 2013-01-28 MED ORDER — CEPHALEXIN 250 MG PO CAPS: 250.0000 mg | ORAL_CAPSULE | Freq: Four times a day (QID) | ORAL | Status: DC

## 2013-01-28 NOTE — ED Notes (Signed)
Reports odor to urine.  denies pain currently.  History of uti. Onset 2 weeks ago

## 2013-01-28 NOTE — ED Notes (Signed)
Went to obtain urine specimen, patient could not void at this time

## 2013-01-28 NOTE — ED Notes (Signed)
Patient requested antibiotic be called in.  Called in to [facility] on [address] .

## 2013-01-28 NOTE — ED Notes (Signed)
Attempted to get specimen, unable to get specimen.  Patient is drinking water.

## 2013-01-28 NOTE — ED Provider Notes (Signed)
History    CSN: [id number] Arrival date & time 01/28/13  1703  First MD Initiated Contact with Patient 01/28/13 1833     Chief Complaint  Patient presents with  . Urinary Tract Infection   (Consider location/radiation/quality/duration/timing/severity/associated sxs/prior Treatment) HPI Comments: 42 year old female presents with a complaint of stating "have not have a bladder infection". For 5-6 years she has had trouble completing her bladder. For the past 2 days she is having malodorous urine, urinary frequency which is also chronic due to her and has and some difficulty in urinating. She urinated prior to coming to the urgent care and she is having trouble giving [place] a urine specimen. She denies dysuria, fever, abdominal or pelvic pain.  Patient is a 42 y.o. female presenting with urinary tract infection.  Urinary Tract Infection   Past Medical History  Diagnosis Date  . MS (multiple sclerosis) 1992    Dr. Sandria Manly  . Depression   . Chronic daily headache 2003    [facility]   Past Surgical History  Procedure Laterality Date  . Tubal ligation    . Ablation colpoclesis  2004  . Cesarean section     Family History  Problem Relation Age of Onset  . Heart failure Father   . Multiple sclerosis Sister   . Cancer Neg Hx   . Diabetes Neg Hx   . Hyperlipidemia Neg Hx   . Hypertension Neg Hx   . Kidney disease Neg Hx   . Stroke Neg Hx    History  Substance Use Topics  . Smoking status: Never Smoker   . Smokeless tobacco: Never Used  . Alcohol Use: 4.2 oz/week    7 Glasses of wine per week   OB History   Grav Para Term Preterm Abortions TAB SAB Ect Mult Living                 Review of Systems  Constitutional: Negative.   Genitourinary: Positive for urgency and frequency. Negative for dysuria, hematuria, flank pain, vaginal bleeding, vaginal discharge, vaginal pain, menstrual problem and pelvic pain.    Allergies  Review of patient's allergies indicates no known  allergies.  Home Medications   Current Outpatient Rx  Name  Route  Sig  Dispense  Refill  . cephALEXin (KEFLEX) 250 MG capsule   Oral   Take 1 capsule (250 mg total) by mouth 4 (four) times daily.   28 capsule   0   . Fingolimod HCl (GILENYA PO)   Oral   Take by mouth.         . nebivolol (BYSTOLIC) 10 MG tablet   Oral   Take 10 mg by mouth daily.         Marland Kitchen EXPIRED: nortriptyline (PAMELOR) 10 MG capsule   Oral   Take 1 capsule (10 mg total) by mouth at bedtime.   90 capsule   1    BP 147/89  Pulse 66  Temp(Src) 98.2 F (36.8 C) (Oral)  Resp 16  SpO2 100% Physical Exam  Nursing note and vitals reviewed. Constitutional: She is oriented to person, place, and time. She appears well-developed and well-nourished. No distress.  Neck: Normal range of motion. Neck supple.  Cardiovascular: Normal rate.   Pulmonary/Chest: Effort normal. No respiratory distress.  Neurological: She is alert and oriented to person, place, and time.  Skin: Skin is warm and dry.  Psychiatric: She has a normal mood and affect.    ED Course  Procedures (including critical care time)  Labs Reviewed  POCT URINALYSIS DIP (DEVICE) - Abnormal; Notable for the following:    Nitrite POSITIVE (*)    All other components within normal limits  URINE CULTURE   No results found. 1. UTI (lower urinary tract infection)     MDM  Drink plenty of fluids Keflex 250 4 times a day for 7 days Followup with her primary care doctor as needed. For any symptoms problems or worsening may return.  Hayden Rasmussen, NP 01/28/13 1900

## 2013-01-30 LAB — URINE CULTURE: Colony Count: >=100000

## 2013-01-30 NOTE — ED Notes (Signed)
Urine culture: >100,000 colonies E. Coli.  Pt. adequately treated with Keflex. Connie Robertson 01/30/2013

## 2013-02-11 NOTE — ED Provider Notes (Signed)
Medical screening examination/treatment/procedure(s) were performed by resident physician or non-physician practitioner and as supervising physician I was immediately available for consultation/collaboration.   KINDL,JAMES DOUGLAS MD.   James D Kindl, MD 02/11/13 0847 

## 2013-09-09 ENCOUNTER — Telehealth: Payer: Self-pay | Admitting: *Deleted

## 2013-09-09 NOTE — Telephone Encounter (Signed)
This is a former [facility] patient.  She has not been seen since Jan 2013.  I called her back.  She is unable to make an appt at this time.  I recommended she contact her PCP to request this refill.  She was in agreement.

## 2013-09-12 ENCOUNTER — Telehealth: Payer: Self-pay | Admitting: Neurology

## 2013-09-12 NOTE — Telephone Encounter (Signed)
This is a former [facility] patient who has not been seen in 2 years.  Says she is unable to make an appt at this time.  She will try another provider.

## 2013-09-12 NOTE — Telephone Encounter (Signed)
The med she was requesting is Acyclovir.

## 2013-09-12 NOTE — Telephone Encounter (Signed)
Patient called to state she asked her PCP for refill as advised, but she was told they would not be able to fill it because they aren't accepting new patient until March and patient is completely out of medication. Please call patient and advise.

## 2013-09-15 ENCOUNTER — Emergency Department (INDEPENDENT_AMBULATORY_CARE_PROVIDER_SITE_OTHER)
Admission: EM | Admit: 2013-09-15 | Discharge: 2013-09-15 | Disposition: A | Discharge status: Home / Self Care | Payer: Medicare Other | Source: Home / Self Care | Attending: Family Medicine | Admitting: Family Medicine

## 2013-09-15 ENCOUNTER — Encounter (HOSPITAL_COMMUNITY): Payer: Self-pay | Admitting: Emergency Medicine

## 2013-09-15 DIAGNOSIS — B009 Herpesviral infection, unspecified: Secondary | ICD-10-CM

## 2013-09-15 MED ORDER — ACYCLOVIR 400 MG PO TABS: 400.0000 mg | ORAL_TABLET | ORAL | Status: DC

## 2013-09-15 MED ORDER — NEBIVOLOL HCL 10 MG PO TABS
10.0000 mg | ORAL_TABLET | Freq: Every day | ORAL | Status: DC
Start: 2013-09-15 — End: 2013-09-26

## 2013-09-15 NOTE — ED Notes (Signed)
Vaginal      Burning  And  Irritation   Slight  Discharge

## 2013-09-15 NOTE — ED Provider Notes (Signed)
CSN: [id number]     Arrival date & time 09/15/13  1116 History   First MD Initiated Contact with Patient 09/15/13 1136     No chief complaint on file.    (Consider location/radiation/quality/duration/timing/severity/associated sxs/prior Treatment) HPI  Past Medical History  Diagnosis Date  . MS (multiple sclerosis) 1992    Dr. Sandria [facility]  . Depression   . Chronic daily headache 2003    [facility]   Past Surgical History  Procedure Laterality Date  . Tubal ligation    . Ablation colpoclesis  2004  . Cesarean section     Family History  Problem Relation Age of Onset  . Heart failure Father   . Multiple sclerosis Sister   . Cancer Neg Hx   . Diabetes Neg Hx   . Hyperlipidemia Neg Hx   . Hypertension Neg Hx   . Kidney disease Neg Hx   . Stroke Neg Hx    History  Substance Use Topics  . Smoking status: Never Smoker   . Smokeless tobacco: Never Used  . Alcohol Use: 4.2 oz/week    7 Glasses of wine per week   OB History   Grav Para Term Preterm Abortions TAB SAB Ect Mult Living                 Review of Systems    Allergies  Review of patient's allergies indicates no known allergies.  Home Medications   Current Outpatient Rx  Name  Route  Sig  Dispense  Refill  . cephALEXin (KEFLEX) 250 MG capsule   Oral   Take 1 capsule (250 mg total) by mouth 4 (four) times daily.   28 capsule   0   . Fingolimod HCl (GILENYA PO)   Oral   Take by mouth.         . nebivolol (BYSTOLIC) 10 MG tablet   Oral   Take 10 mg by mouth daily.         Marland Kitchen. EXPIRED: nortriptyline (PAMELOR) 10 MG capsule   Oral   Take 1 capsule (10 mg total) by mouth at bedtime.   90 capsule   1    BP 149/111  Pulse 62  Temp(Src) 99.3 F (37.4 C) (Oral)  Resp 16  SpO2 98% Physical Exam  ED Course  Procedures (including critical care time) Labs Review Labs Reviewed - No data to display Imaging Review No results found.    MDM   Diagnosis herpes suppression  Acyclovir    Elson AreasLeslie K  Norabelle Kondo, [place] JerseyPA-C 09/15/13 1151

## 2013-09-15 NOTE — Discharge Instructions (Signed)
Herpes Labialis  You have a fever blister or cold sore (herpes labialis). These painful, grouped sores are caused by one of the herpes viruses (HSV1 most commonly). They are usually found around the lips and mouth, but the same infection can also affect other areas on the face such as the nose and eyes. Herpes infections take about 10 days to heal. They often occur again and again in the same spot. Other symptoms may include numbness and tingling in the involved skin, achiness, fever, and swollen glands in the neck. Colds, emotional stress, injuries, or excess sunlight exposure all seem to make herpes reappear. Herpes lip infections are contagious. Direct contact with these sores can spread the infection. It can also be spread to other parts of your own body.  TREATMENT   Herpes labialis is usually self-limited and resolves within 1 week. To reduce pain and swelling, apply ice packs frequently to the sores or suck on popsicles or frozen juice bars. Antiviral medicine may be used by mouth to shorten the duration of the breakout. Avoid spreading the infection by washing your hands often. Be careful not to touch your eyes or genital areas after handling the infected blisters. Do not kiss or have other intimate contact with others. After the blisters are completely healed you may resume contact. Use sunscreen to lessen recurrences.   If this is your first infection with herpes, or if you have a severe or repeated infections, your caregiver may prescribe one of the anti-viral drugs to speed up the healing. If you have sun-related flare-ups despite the use of sunscreen, starting oral anti-viral medicine before a prolonged exposure (going skiing or to the beach) can prevent most episodes.   SEEK IMMEDIATE MEDICAL CARE IF:  · You develop a headache, sleepiness, high fever, vomiting, or severe weakness.  · You have eye irritation, pain, blurred vision or redness.  · You develop a prolonged infection not getting better in 10  days.  Document Released: 07/24/2005 Document Revised: 10/16/2011 Document Reviewed: 05/28/2009  ExitCare® Patient Information ©2014 [employer].

## 2013-09-17 NOTE — ED Provider Notes (Signed)
Medical screening examination/treatment/procedure(s) were performed by a resident physician or non-physician practitioner and as the supervising physician I was immediately available for consultation/collaboration.  Callen Zuba, MD    Jayla Mackie S Clella Mckeel, MD 09/17/13 0747 

## 2013-09-26 ENCOUNTER — Encounter (HOSPITAL_COMMUNITY): Payer: Self-pay | Admitting: Emergency Medicine

## 2013-09-26 ENCOUNTER — Emergency Department (INDEPENDENT_AMBULATORY_CARE_PROVIDER_SITE_OTHER)
Admission: EM | Admit: 2013-09-26 | Discharge: 2013-09-26 | Disposition: A | Discharge status: Home / Self Care | Payer: Managed Care, Other (non HMO) | Source: Home / Self Care

## 2013-09-26 DIAGNOSIS — I10 Essential (primary) hypertension: Secondary | ICD-10-CM

## 2013-09-26 MED ORDER — LISINOPRIL 10 MG PO TABS: 10.0000 mg | ORAL_TABLET | Freq: Every day | ORAL | Status: DC

## 2013-09-26 NOTE — Discharge Instructions (Signed)
DASH Diet The DASH diet stands for "Dietary Approaches to Stop Hypertension." It is a healthy eating plan that has been shown to reduce high blood pressure (hypertension) in as little as 14 days, while also possibly providing other significant health benefits. These other health benefits include reducing the risk of breast cancer after menopause and reducing the risk of type 2 diabetes, heart disease, colon cancer, and stroke. Health benefits also include weight loss and slowing kidney failure in patients with chronic kidney disease.  DIET GUIDELINES  Limit salt (sodium). Your diet should contain less than 1500 mg of sodium daily.  Limit refined or processed carbohydrates. Your diet should include mostly whole grains. Desserts and added sugars should be used sparingly.  Include small amounts of heart-healthy fats. These types of fats include nuts, oils, and tub margarine. Limit saturated and trans fats. These fats have been shown to be harmful in the body. CHOOSING FOODS  The following food groups are based on a 2000 calorie diet. See your Registered Dietitian for individual calorie needs. Grains and Grain Products (6 to 8 servings daily)  Eat More Often: Whole-wheat bread, brown rice, whole-grain or wheat pasta, quinoa, popcorn without added fat or salt (air popped).  Eat Less Often: White bread, white pasta, white rice, cornbread. Vegetables (4 to 5 servings daily)  Eat More Often: Fresh, frozen, and canned vegetables. Vegetables may be raw, steamed, roasted, or grilled with a minimal amount of fat.  Eat Less Often/Avoid: Creamed or fried vegetables. Vegetables in a cheese sauce. Fruit (4 to 5 servings daily)  Eat More Often: All fresh, canned (in natural juice), or frozen fruits. Dried fruits without added sugar. One hundred percent fruit juice ( cup [237 mL] daily).  Eat Less Often: Dried fruits with added sugar. Canned fruit in light or heavy syrup. Lean Meats, Fish, and Poultry (2  servings or less daily. One serving is 3 to 4 oz [85-114 g]).  Eat More Often: Ninety percent or leaner ground beef, tenderloin, sirloin. Round cuts of beef, chicken breast, turkey breast. All fish. Grill, bake, or broil your meat. Nothing should be fried.  Eat Less Often/Avoid: Fatty cuts of meat, turkey, or chicken leg, thigh, or wing. Fried cuts of meat or fish. Dairy (2 to 3 servings)  Eat More Often: Low-fat or fat-free milk, low-fat plain or light yogurt, reduced-fat or part-skim cheese.  Eat Less Often/Avoid: Milk (whole, 2%).Whole milk yogurt. Full-fat cheeses. Nuts, Seeds, and Legumes (4 to 5 servings per week)  Eat More Often: All without added salt.  Eat Less Often/Avoid: Salted nuts and seeds, canned beans with added salt. Fats and Sweets (limited)  Eat More Often: Vegetable oils, tub margarines without trans fats, sugar-free gelatin. Mayonnaise and salad dressings.  Eat Less Often/Avoid: Coconut oils, palm oils, butter, stick margarine, cream, half and half, cookies, candy, pie. FOR MORE INFORMATION The Dash Diet Eating Plan: www.dashdiet.org Document Released: 07/13/2011 Document Revised: 10/16/2011 Document Reviewed: 07/13/2011 ExitCare Patient Information 2014 [employer]. Hypertension Hypertension is another name for high blood pressure. High blood pressure may mean that your heart needs to work harder to pump blood. Blood pressure consists of two numbers, which includes a higher number over a lower number (example: 110/72). HOME CARE   Make lifestyle changes as told by your doctor. This may include weight loss and exercise.  Take your blood pressure medicine every day.  Limit how much salt you use.  Stop smoking if you smoke.  Do not use drugs.  Talk   to your doctor if you are using decongestants or birth control pills. These medicines might make blood pressure higher.  Females should not drink more than 1 alcoholic drink per day. Males should not drink  more than 2 alcoholic drinks per day.  See your doctor as told. GET HELP RIGHT AWAY IF:   You have a blood pressure reading with a top number of 180 or higher.  You get a very bad headache.  You get blurred or changing vision.  You feel confused.  You feel weak, numb, or faint.  You get chest or belly (abdominal) pain.  You throw up (vomit).  You cannot breathe very well. MAKE SURE YOU:   Understand these instructions.  Will watch your condition.  Will get help right away if you are not doing well or get worse. Document Released: 01/10/2008 Document Revised: 10/16/2011 Document Reviewed: 01/10/2008 ExitCare Patient Information 2014 [employer].  

## 2013-09-26 NOTE — ED Provider Notes (Signed)
CSN: [id number]     Arrival date & time 09/26/13  2000 History   None    Chief Complaint  Patient presents with  . Medication Refill     (Consider location/radiation/quality/duration/timing/severity/associated sxs/prior Treatment) HPI Comments: Patient presents today with high BP. She was given a RX for Bystolic from our PA here several weeks ago and she cannot afford. She would like to try something different. She has chronic headaches for 12 years. No new changes. No acute changes. Also has MS.   The history is provided by the patient.    Past Medical History  Diagnosis Date  . MS (multiple sclerosis) 1992    Dr. Sandria ManlyLove  . Depression   . Chronic daily headache 2003    [facility]   Past Surgical History  Procedure Laterality Date  . Tubal ligation    . Ablation colpoclesis  2004  . Cesarean section     Family History  Problem Relation Age of Onset  . Heart failure Father   . Multiple sclerosis Sister   . Cancer Neg Hx   . Diabetes Neg Hx   . Hyperlipidemia Neg Hx   . Hypertension Neg Hx   . Kidney disease Neg Hx   . Stroke Neg Hx    History  Substance Use Topics  . Smoking status: Never Smoker   . Smokeless tobacco: Never Used  . Alcohol Use: 4.2 oz/week    7 Glasses of wine per week   OB History   Grav Para Term Preterm Abortions TAB SAB Ect Mult Living                 Review of Systems  All other systems reviewed and are negative.      Allergies  Review of patient's allergies indicates no known allergies.  Home Medications   Current Outpatient Rx  Name  Route  Sig  Dispense  Refill  . acyclovir (ZOVIRAX) 400 MG tablet   Oral   Take 1 tablet (400 mg total) by mouth 1 day or 1 dose.   90 tablet   0   . cephALEXin (KEFLEX) 250 MG capsule   Oral   Take 1 capsule (250 mg total) by mouth 4 (four) times daily.   28 capsule   0   . Fingolimod HCl (GILENYA PO)   Oral   Take by mouth.         Marland Kitchen. lisinopril (PRINIVIL,ZESTRIL) 10 MG tablet   Oral  Take 1 tablet (10 mg total) by mouth daily.   30 tablet   0   . EXPIRED: nortriptyline (PAMELOR) 10 MG capsule   Oral   Take 1 capsule (10 mg total) by mouth at bedtime.   90 capsule   1    BP 154/90  Pulse 74  Temp(Src) 99.8 F (37.7 C) (Oral)  Resp 18  SpO2 96% Physical Exam  Nursing note and vitals reviewed. Constitutional: She is oriented to person, place, and time. She appears well-developed and well-nourished. No distress.  HENT:  Head: Normocephalic and atraumatic.  Eyes: EOM are normal. Pupils are equal, round, and reactive to light.  Cardiovascular: Normal rate and regular rhythm.   No murmur heard. Pulmonary/Chest: Effort normal and breath sounds normal.  Neurological: She is alert and oriented to person, place, and time. No cranial nerve deficit.  Skin: Skin is warm and dry. She is not diaphoretic.  Psychiatric: Her behavior is normal.    ED Course  Procedures (including critical care time) Labs  Review Labs Reviewed - No data to display Imaging Review No results found.    MDM   Final diagnoses:  Essential hypertension, benign   Educated that we cannot serve as a primary care, and medications such as antihypertensives need to be monitored closely with labs and exam.  I will change meds to Lisinopril (no kidney disease or heart issues), but only enough for 30 days, as she must find a regular PCP to follow this. She expresses understanding And will make appt.     Riki Sheer, PA-C 09/26/13 2041

## 2013-09-28 NOTE — ED Provider Notes (Signed)
Medical screening examination/treatment/procedure(s) were performed by a resident physician or non-physician practitioner and as the supervising physician I was immediately available for consultation/collaboration.  Lissandra Keil, MD    Kaori Jumper S Adelayde Minney, MD 09/28/13 0832 

## 2013-11-17 ENCOUNTER — Telehealth: Payer: Self-pay | Admitting: *Deleted

## 2013-11-18 ENCOUNTER — Telehealth: Payer: Self-pay | Admitting: Neurology

## 2013-11-18 NOTE — Telephone Encounter (Signed)
Former [facility] patient has not been seen since Jan 2013 requesting refills.  Dr Sandria Manly noted patient could see Dr Marjory Lies in his note from 09/25/12 in [facility].  Patient has requested (per Visit info on this message) to see Dr Anne Hahn.  Will send to triage to verify new MD assignment/appt.

## 2013-11-18 NOTE — Telephone Encounter (Signed)
Reassigned to Dr. Willis. 

## 2013-11-18 NOTE — Telephone Encounter (Signed)
I spoke with patient.  She is aware we will send refills to last until her appt once one is made.  Pharmacy will overnight meds.

## 2013-11-18 NOTE — Telephone Encounter (Signed)
Patient requesting samples of Gilenya and also would like to set up an appointment with a new provider at [facility]. She is a former Dr. Sandria Manly patient. Please call to advise.

## 2013-11-18 NOTE — Telephone Encounter (Signed)
Pt also needs refill Fingolimod HCl (GILENYA PO) sent to [facility]- [phone]. She also would like to know if there are any samples here at the office because she only has two pills.

## 2013-11-19 ENCOUNTER — Telehealth: Payer: Self-pay | Admitting: Neurology

## 2013-11-19 NOTE — Telephone Encounter (Signed)
I called the pharmacy.  Gave Diane a verbal order for Gilenya.  Then spoke with Paulette who said the order will be shipped out today, for delivery tomorrow, they just need to speak with the patient to verify delivery.  She will call the patient within the next few minutes.  I called the patient back.  She is aware the pharmacy will be calling her.  Advised her she could also call them if needed.  She understands they will overnight the meds, but not do so until they speak to her.  Patient admits she procrastinates when it comes to her medication and will try to not wait until the last minute in the future.

## 2013-11-19 NOTE — Telephone Encounter (Signed)
Pt called I got pt in to see Dr. Anne Hahn this coming Friday pt wants to see if we can call in her Fingolimod HCl (GILENYA PO). Please call pt when this has been called in. Thanks

## 2013-11-21 ENCOUNTER — Encounter: Payer: Self-pay | Admitting: Neurology

## 2013-11-21 ENCOUNTER — Ambulatory Visit (INDEPENDENT_AMBULATORY_CARE_PROVIDER_SITE_OTHER): Payer: 59 | Admitting: Neurology

## 2013-11-21 ENCOUNTER — Encounter (INDEPENDENT_AMBULATORY_CARE_PROVIDER_SITE_OTHER): Payer: Self-pay

## 2013-11-21 ENCOUNTER — Telehealth: Payer: Self-pay | Admitting: Neurology

## 2013-11-21 VITALS — BP 156/89 | HR 68 | Wt 233.0 lb

## 2013-11-21 DIAGNOSIS — G35 Multiple sclerosis: Secondary | ICD-10-CM

## 2013-11-21 DIAGNOSIS — N319 Neuromuscular dysfunction of bladder, unspecified: Secondary | ICD-10-CM

## 2013-11-21 DIAGNOSIS — Z5181 Encounter for therapeutic drug level monitoring: Secondary | ICD-10-CM

## 2013-11-21 HISTORY — DX: Neuromuscular dysfunction of bladder, unspecified: N31.9

## 2013-11-21 HISTORY — DX: Multiple sclerosis: G35

## 2013-11-21 LAB — COMPREHENSIVE METABOLIC PANEL WITH GFR
ALT: 26 IU/L (ref 0–32)
AST: 27 IU/L (ref 0–40)
Albumin/Globulin Ratio: 2 (ref 1.1–2.5)
Albumin: 4.6 g/dL (ref 3.5–5.5)
Alkaline Phosphatase: 53 IU/L (ref 39–117)
BUN/Creatinine Ratio: 13 (ref 9–23)
BUN: 14 mg/dL (ref 6–24)
CO2: 28 mmol/L (ref 18–29)
Calcium: 9.7 mg/dL (ref 8.7–10.2)
Chloride: 103 mmol/L (ref 96–108)
Creatinine, Ser: 1.1 mg/dL — ABNORMAL HIGH (ref 0.57–1.00)
GFR calc Af Amer: 71 mL/min/1.73 (ref >59)
GFR calc non Af Amer: 61 mL/min/1.73 (ref >59)
Globulin, Total: 2.3 g/dL (ref 1.5–4.5)
Glucose: 82 mg/dL (ref 65–99)
Potassium: 4.3 mmol/L (ref 3.5–5.2)
Sodium: 139 mmol/L (ref 134–144)
Total Bilirubin: 0.3 mg/dL (ref 0.0–1.2)
Total Protein: 6.9 g/dL (ref 6.0–8.5)

## 2013-11-21 LAB — CBC WITH DIFFERENTIAL
BASOS: 0 %
Basophils Absolute: 0 10*3/uL (ref 0.0–0.2)
EOS ABS: 0 10*3/uL (ref 0.0–0.4)
Eos: 2 %
HCT: 38 % (ref 34.0–46.6)
HEMOGLOBIN: 13.2 g/dL (ref 11.1–15.9)
LYMPHS ABS: 0.4 10*3/uL — AB (ref 0.7–3.1)
Lymphs: 21 %
MCH: 31.2 pg (ref 26.6–33.0)
MCHC: 34.7 g/dL (ref 31.5–35.7)
MCV: 90 fL (ref 79–97)
MONOS ABS: 0.3 10*3/uL (ref 0.1–0.9)
Monocytes: 16 %
NEUTROS PCT: 61 %
Neutrophils Absolute: 1.2 10*3/uL — ABNORMAL LOW (ref 1.4–7.0)
Platelets: 213 10*3/uL (ref 150–379)
RBC: 4.23 x10E6/uL (ref 3.77–5.28)
RDW: 13 % (ref 12.3–15.4)
WBC: 2 10*3/uL — CL (ref 3.4–10.8)

## 2013-11-21 MED ORDER — CIPROFLOXACIN HCL 0.3 % OP SOLN: 2.0000 [drp] | OPHTHALMIC | Status: DC

## 2013-11-21 MED ORDER — OXYBUTYNIN CHLORIDE ER 10 MG PO TB24: 10.0000 mg | ORAL_TABLET | Freq: Every day | ORAL | Status: DC

## 2013-11-21 NOTE — Telephone Encounter (Signed)
Left message that we have completed these forms from [employer] for MRI's in the past.  I asked that she call the program to get necessary information/documentations and submit to office for completion.

## 2013-11-21 NOTE — Patient Instructions (Signed)
Multiple Sclerosis  Multiple sclerosis (MS) is a disease of the central nervous system. It leads to loss of the insulating covering of the nerves (myelin sheath) of your brain. When this happens, brain signals do not get transmitted properly or may not get transmitted at all. The symptoms of MS occur in episodes or attacks. These attacks may last weeks to months. There may be long periods of nearly no problems between attacks. The age of onset of MS varies.   CAUSES  The cause of MS is unknown. However, it is more common in the [place] than in the southern [place].  RISK FACTORS  There is a higher incidence of MS in women than in men. MS is not an inherited illness, although your risk of MS is higher if you have a relative with MS.  SIGNS AND SYMPTOMS   The symptoms of MS occur in episodes or attacks. These attacks may last weeks to months. There may be long periods of almost no symptoms between attacks.  The symptoms of MS vary. This is because of the many different ways it affects the central nervous system. The main symptoms of MS include:   Vision problems and eye pain.   Numbness.   Weakness.   Paralysis in your arms, hands, feet, and legs (extremities).   Balance problems.   Tremors.  DIAGNOSIS   Your health care provider can diagnose MS with the help of imaging exams and lab tests. These may include specialized X-ray exams and spinal fluid tests. The best imaging exam to confirm a diagnosis of MS is MRI.  TREATMENT   There is no known cure for MS, but there are medicines that can decrease the number and frequency of attacks. Steroids are often used for short-term relief. Physical and occupational therapy may also help.  HOME CARE INSTRUCTIONS    Take medicines as directed by your health care provider.   Exercise as directed by your health care provider.  SEEK MEDICAL CARE IF:  You begin to feel depressed.  SEEK IMMEDIATE MEDICAL CARE IF:   You develop paralysis.   You develop  problems with bladder, bowel, or sexual function.   You develop mental changes, such as forgetfulness or mood swings.   You have a seizure.  Document Released: 07/21/2000 Document Revised: 05/14/2013 Document Reviewed: 03/31/2013  ExitCare Patient Information 2014 [employer].

## 2013-11-21 NOTE — Progress Notes (Signed)
Reason for visit: Multiple sclerosis  Connie Robertson is an 43 y.o. female  History of present illness:  [name] is a 43 year old right-handed black female with a history of multiple sclerosis. The patient began having symptoms around age 43 with a right hemi-facial spasm. The patient then developed a right optic neuritis, and a left hemisensory deficit. The patient has been on various medications including Betaseron, Copaxone, and now she is on Gilenya. The patient has done quite well on Gilenya without any recurrence of symptoms since she was seen in this office in January 2013. The patient has not followed up through this office mainly because she is felt well. The patient has not had any blood work done since that time while on Gilenya. The patient has had some recent issues with dyspnea and fatigue. The patient has problems with neurogenic bladder, and she was prescribed Detrol, but her insurance would not cover this. The patient has migraine headaches as well, and she has been on Topamax in the past with some benefit. The patient is trying to remain active to help her lose weight. The patient has had MRI evaluation of the brain in 2010 which showed very active disease. The patient comes back to this office for further evaluation. The patient has had a recent fall, and she does report some troubles with gait instability. The patient is unable to empty her bladder completely, but she denies frequent bladder infections.  Past Medical History  Diagnosis Date  . MS (multiple sclerosis) 1992    Dr. Sandria ManlyLove  . Depression   . Chronic daily headache 2003    [facility]  . Multiple sclerosis 11/21/2013  . Neurogenic bladder 11/21/2013    Past Surgical History  Procedure Laterality Date  . Tubal ligation    . Ablation colpoclesis  2004  . Cesarean section      Family History  Problem Relation Age of Onset  . Heart failure Father   . Multiple sclerosis Sister   . Cancer Neg Hx   . Diabetes  Neg Hx   . Hyperlipidemia Neg Hx   . Hypertension Neg Hx   . Kidney disease Neg Hx   . Stroke Neg Hx     Social history:  reports that she has never smoked. She has never used smokeless tobacco. She reports that she drinks about 4.2 ounces of alcohol per week. She reports that she does not use illicit drugs.   No Known Allergies  Medications:  Current Outpatient Prescriptions on File Prior to Visit  Medication Sig Dispense Refill  . acyclovir (ZOVIRAX) 400 MG tablet Take 1 tablet (400 mg total) by mouth 1 day or 1 dose.  90 tablet  0  . nortriptyline (PAMELOR) 10 MG capsule Take 1 capsule (10 mg total) by mouth at bedtime.  90 capsule  1  . [DISCONTINUED] nebivolol (BYSTOLIC) 10 MG tablet Take 1 tablet (10 mg total) by mouth daily.  90 tablet  0   No current facility-administered medications on file prior to visit.    ROS:  Out of a complete 14 system review of symptoms, the patient complains only of the following symptoms, and all other reviewed systems are negative.  Appetite change, fatigue, weight change Discharge, eye redness on the right, light sensitivity, obtain Shortness of breath Heat intolerance Frequency of urination Memory loss, headache, numbness  Blood pressure 156/89, pulse 68, weight 233 lb (105.688 kg).  Physical Exam  General: The patient is alert and cooperative at the time  of the examination. The patient is moderately obese.  Skin: No significant peripheral edema is noted.   Neurologic Exam  Mental status: The patient is oriented x 3.  Cranial nerves: Facial symmetry is present. Speech is normal, no aphasia or dysarthria is noted. Extraocular movements are full. Visual fields are full. Redness of the sclera the right eye is noted. Pupils are equal, round, and reactive to light. Discs are flat bilaterally. There may be optic atrophy in both discs.  Motor: The patient has good strength in all 4 extremities.  Sensory examination: Soft touch sensation  and pinprick sensation are slightly decreased on the left leg or the right, symmetric in arms and face.  Coordination: The patient has good finger-nose-finger and heel-to-shin bilaterally.  Gait and station: The patient has a normal gait. Tandem gait is slightly unsteady. Romberg is negative. No drift is seen.  Reflexes: Deep tendon reflexes are symmetric.   Assessment/Plan:  One. Multiple sclerosis  2. Migraine headache  3. Neurogenic bladder  The patient is on Gilenya, and she has clinically done well on this. The patient has not followed up in the office in the last 2 years. The patient will be sent for MRI evaluation of the brain and cervical spine. The patient will have blood work done today. The patient will be given a prescription for oxybutynin in hopes that her insurance company will pay for this. The patient will followup in 6 months. A prescription was given for Cipro ophthalmic solution for her conjunctivitis in the right eye.  Marlan Palau MD 11/21/2013 6:22 PM  [facility] [address] [place], [place] [address]  Phone [phone] Fax [phone]

## 2013-11-21 NOTE — Telephone Encounter (Signed)
Patient at check out today is wondering if she can have help with her MRI through the [employer] program. Please call and advise patient.

## 2013-11-21 NOTE — Telephone Encounter (Signed)
Events noted, we will filiforms out once they are available to us.

## 2013-11-24 NOTE — Progress Notes (Signed)
Quick Note:  Informed patient of low WBC, absolute lymphocytes count of 0.4, remaining labs are normal, patient expressed understanding. ______

## 2014-01-22 ENCOUNTER — Emergency Department (INDEPENDENT_AMBULATORY_CARE_PROVIDER_SITE_OTHER)
Admission: EM | Admit: 2014-01-22 | Discharge: 2014-01-22 | Disposition: A | Discharge status: Home / Self Care | Payer: Managed Care, Other (non HMO) | Source: Home / Self Care

## 2014-01-22 ENCOUNTER — Encounter (HOSPITAL_COMMUNITY): Payer: Self-pay | Admitting: Emergency Medicine

## 2014-01-22 DIAGNOSIS — T148XXA Other injury of unspecified body region, initial encounter: Secondary | ICD-10-CM

## 2014-01-22 DIAGNOSIS — X58XXXA Exposure to other specified factors, initial encounter: Secondary | ICD-10-CM

## 2014-01-22 DIAGNOSIS — M25569 Pain in unspecified knee: Secondary | ICD-10-CM

## 2014-01-22 DIAGNOSIS — S43499A Other sprain of unspecified shoulder joint, initial encounter: Secondary | ICD-10-CM

## 2014-01-22 DIAGNOSIS — X503XXA Overexertion from repetitive movements, initial encounter: Secondary | ICD-10-CM

## 2014-01-22 DIAGNOSIS — S46819A Strain of other muscles, fascia and tendons at shoulder and upper arm level, unspecified arm, initial encounter: Secondary | ICD-10-CM

## 2014-01-22 MED ORDER — DICLOFENAC SODIUM 1 % TD GEL: 1.0000 "application " | Freq: Four times a day (QID) | TRANSDERMAL | Status: DC

## 2014-01-22 NOTE — ED Notes (Signed)
Back pain in right lower back that started 3 days ago.  Since then this pain has resolved, pain is in upper left back and patient has had issues with turning head to the left and putting chin to chest .  Patient reports she thought symptoms "gas" related, and has been belching frequently.  Pain in back worse with movement.  Denies chest pain.  Patient reports various work out routines: back exercises and in a spin class -both for 3 months, not new.

## 2014-01-22 NOTE — Discharge Instructions (Signed)
Muscle Strain A muscle strain is an injury that occurs when a muscle is stretched beyond its normal length. Usually a small number of muscle fibers are torn when this happens. Muscle strain is rated in degrees. First-degree strains have the least amount of muscle fiber tearing and pain. Second-degree and third-degree strains have increasingly more tearing and pain.  Usually, recovery from muscle strain takes 1-2 weeks. Complete healing takes 5-6 weeks.  CAUSES  Muscle strain happens when a sudden, violent force placed on a muscle stretches it too far. This may occur with lifting, sports, or a fall.  RISK FACTORS Muscle strain is especially common in athletes.  SIGNS AND SYMPTOMS At the site of the muscle strain, there may be:  Pain.  Bruising.  Swelling.  Difficulty using the muscle due to pain or lack of normal function. DIAGNOSIS  Your health care provider will perform a physical exam and ask about your medical history. TREATMENT  Often, the best treatment for a muscle strain is resting, icing, and applying cold compresses to the injured area.  HOME CARE INSTRUCTIONS   Use the PRICE method of treatment to promote muscle healing during the first 2-3 days after your injury. The PRICE method involves:  Protecting the muscle from being injured again.  Restricting your activity and resting the injured body part.  Icing your injury. To do this, put ice in a plastic bag. Place a towel between your skin and the bag. Then, apply the ice and leave it on from 15-20 minutes each hour. After the third day, switch to moist heat packs.  Apply compression to the injured area with a splint or elastic bandage. Be careful not to wrap it too tightly. This may interfere with blood circulation or increase swelling.  Elevate the injured body part above the level of your heart as often as you can.  Only take over-the-counter or prescription medicines for pain, discomfort, or fever as directed by your  health care provider.  Warming up prior to exercise helps to prevent future muscle strains. SEEK MEDICAL CARE IF:   You have increasing pain or swelling in the injured area.  You have numbness, tingling, or a significant loss of strength in the injured area. MAKE SURE YOU:   Understand these instructions.  Will watch your condition.  Will get help right away if you are not doing well or get worse. Document Released: 07/24/2005 Document Revised: 05/14/2013 Document Reviewed: 02/20/2013 [facility] Patient Information 2015 [place], [place]. This information is not intended to replace advice given to you by your health care provider. Make sure you discuss any questions you have with your health care provider.  Knee Pain Knee pain can be a result of an injury or other medical conditions. Treatment will depend on the cause of your pain. HOME CARE  Only take medicine as told by your doctor.  Keep a healthy weight. Being overweight can make the knee hurt more.  Stretch before exercising or playing sports.  If there is constant knee pain, change the way you exercise. Ask your doctor for advice.  Make sure shoes fit well. Choose the right shoe for the sport or activity.  Protect your knees. Wear kneepads if needed.  Rest when you are tired. GET HELP RIGHT AWAY IF:   Your knee pain does not stop.  Your knee pain does not get better.  Your knee joint feels hot to the touch.  You have a fever. MAKE SURE YOU:   Understand these instructions.  Will watch this condition.  Will get help right away if you are not doing well or get worse. Document Released: 10/20/2008 Document Revised: 10/16/2011 Document Reviewed: 10/20/2008 [facility] Patient Information 2015 [place]. This information is not intended to replace advice given to you by your health care provider. Make sure you discuss any questions you have with your health care provider.  Repetitive Strain Injuries Repetitive  strain injuries (RSIs) result from overuse or misuse of soft tissues including muscles, tendons, or nerves. Tendons are the cord-like structures that attach muscles to bones. RSIs can affect almost any part of the body. However, RSIs are most common in the arms (thumbs, wrists, elbows, shoulders) and legs (ankles, knees). Common medical conditions that are often caused by repetitive strain include carpal tunnel syndrome, tennis or golfer's elbow, bursitis, and tendonitis. If RSIs are treated early, and therepeated activity is reduced or removed, the severity and length of your problems can usually be reduced. RSIs are also called cumulative trauma disorders (CTD).  CAUSES  Many RSIs occur due to repeating the same activity at work over weeks or months without sufficient rest, such as prolonged typing. RSIs also commonly occur when a hobby or sport is done repeatedly without sufficient rest. RSIs can also occur due to repeated strain or stress on a body part in someone who has one or more risk factors for RSIs. RISK FACTORS Workplace risk factors  Frequent computer use, especially if your workstation is not adjusted for your body type.  Infrequent rest breaks.  Working in a high-pressure environment.  Working at a [employer]fast pace.  Repeating the same motion, such as frequent typing.  Working in an awkward position or holding the same position for a long time.  Forceful movements such as lifting, pulling, or pushing.  Vibration caused by using power tools.  Working in cold temperatures.  Job stress. Personal risk factors  Poor posture.  Being loose-jointed.  Not exercising regularly.  Being overweight.  Arthritis, diabetes, thyroid problems, or other long-term (chronic)medical conditions.  Vitamin deficiencies.  Keeping your fingernails long.  An unhealthy, stressful, or inactive lifestyle.  Not sleeping well. SYMPTOMS  Symptoms often begin at work but become more noticeable  after the repeated stress has ended. For example, you may develop fatigue or soreness in your wrist while typingat work, and at night you may develop numbness and tingling in your fingers. Common symptoms include:   Burning, shooting, or aching pain, especially in the fingers, palms, wrists, forearms, or shoulders.  Tenderness.  Swelling.  Tingling, numbness, or loss of feeling.  Pain with certain activities, such as turning a doorknob or reaching above your head.  Weakness, heaviness, or loss of coordination in yourhand.  Muscle spasms or tightness. In some cases, symptoms can become so intense that it is difficult to perform everyday tasks. Symptoms that do not improve with rest may indicate a more serious condition.  DIAGNOSIS  Your caregiver may determine the type ofRSI you have based on your medical evaluation and a description of your activities.  TREATMENT  Treatment depends on the severity and type of RSI you have. Your caregiver may recommend rest for the affected body part, medicines, and physical or occupational therapy to reduce pain, swelling, and soreness. Discuss the activities you do repeatedly with your caregiver. Your caregiver can help you decide whether you need to change your activities. An RSI may take months or years to heal, especially if the affected body part gets insufficient rest. In some cases,  such as severe carpal tunnel syndrome, surgery may be recommended. PREVENTION  Talk with your supervisor to make sure you have the proper equipment for your work station.  Maintain good posture at your desk or work station with:  Feet flat on the floor.  Knees directly over the feet, bent at a right angle.  Lower back supported by your chair or a cushion in the curve of your lower back.  Shoulders and arms relaxed and at your sides.  Neck relaxed and not bent forwards or backwards.  Your desk and computer workstation properly adjusted to your body  type.  Your chair adjusted so there is no excess pressure on the back of your thighs.  The keyboard resting above your thighs. You should be able to reach the keys with your elbows at your side, bent at a right angle. Your arms should be supported on forearm rests, with your forearms parallel to the ground.  The computer mouse within easy reach.  The monitor directly in front of you, so that your eyes are aligned with the top of the screen. The screen should be about 15 to 25 inches from your eyes.  While typing, keep your wrist straight, in a neutral position. Move your entire arm when you move your mouse or when typing hard-to-reach keys.  Only use your computer as much as you need to for work. Do not use it during breaks.  Take breaks often from any repeated activity. Alternate with another task which requires you to use different muscles, or rest at least once every hour.  Change positions regularly. If you spend a lot of time sitting, get up, walk around, and stretch.  Do not hold pens or pencils tightly when writing.  Exercise regularly.  Maintain a normal weight.  Eat a diet with plenty of vegetables, whole grains, and fruit.  Get sufficient, restful sleep. HOME CARE INSTRUCTIONS  If your caregiver prescribed medicine to help reduce swelling, take it as directed.  Only take over-the-counter or prescription medicines for pain, discomfort, or fever as directed by your caregiver.  Reduce, and if needed, stopthe activities that are causing your problems until you have no further symptoms.If your symptoms are work-related, you may need to talk to your supervisor about changing your activities.  When symptoms develop, put ice or a cold pack on the aching area.  Put ice in a plastic bag.  Place a towel between your skin and the bag.  Leave the ice on for 15-20 minutes.  If you were given a splint to keep your wrist from bending, wear it as instructed. It is important to  wear the splint at night. Use the splint for as long as your caregiver recommends. SEEK MEDICAL CARE IF:  You develop new problems.  Your problems do not get better with medicine. MAKE SURE YOU:  Understand these instructions.  Will watch your condition.  Will get help right away if you are not doing well or get worse. Document Released: 07/14/2002 Document Revised: 01/23/2012 Document Reviewed: 09/14/2011 [facility] Patient Information 2015 [place], [place]. This information is not intended to replace advice given to you by your health care provider. Make sure you discuss any questions you have with your health care provider.

## 2014-01-22 NOTE — ED Provider Notes (Signed)
CSN: [id number]     Arrival date & time 01/22/14  0810 History   First MD Initiated Contact with Patient 01/22/14 0914     Chief Complaint  Patient presents with  . Back Pain   (Consider location/radiation/quality/duration/timing/severity/associated sxs/prior Treatment) HPI Comments: 43 year old female is complaining of pain in the left trapezius musculature. There is pain with forward flexion of the head and abduction of the left arm. She is also concerned about soreness in the bilateral knees. She exercises regularly and has been for the past several years. She states she has been hitting the bicycle, spinning class hard lately. She also performs resistant exercises. She denies injury, fall or other trauma to the knees. She states they are sore. She does not believe that her exercises have anything to do with the soreness in her muscles and knees but calls it has never happened before.   Past Medical History  Diagnosis Date  . MS (multiple sclerosis) 1992    Dr. Sandria ManlyLove  . Depression   . Chronic daily headache 2003    Love  . Multiple sclerosis 11/21/2013  . Neurogenic bladder 11/21/2013   Past Surgical History  Procedure Laterality Date  . Tubal ligation    . Ablation colpoclesis  2004  . Cesarean section     Family History  Problem Relation Age of Onset  . Heart failure Father   . Multiple sclerosis Sister   . Cancer Neg Hx   . Diabetes Neg Hx   . Hyperlipidemia Neg Hx   . Hypertension Neg Hx   . Kidney disease Neg Hx   . Stroke Neg Hx    History  Substance Use Topics  . Smoking status: Never Smoker   . Smokeless tobacco: Never Used  . Alcohol Use: 4.2 oz/week    7 Glasses of wine per week   OB History   Grav Para Term Preterm Abortions TAB SAB Ect Mult Living                 Review of Systems  Constitutional: Positive for activity change. Negative for fever, chills and fatigue.  HENT: Negative.   Respiratory: Negative.   Cardiovascular: Negative.    Musculoskeletal: Positive for myalgias and neck pain. Negative for arthralgias, back pain, gait problem, joint swelling and neck stiffness.       As per HPI  Skin: Negative for color change, pallor and rash.  Neurological: Negative.     Allergies  Review of patient's allergies indicates no known allergies.  Home Medications   Prior to Admission medications   Medication Sig Start Date End Date Taking? Authorizing Provider  amLODipine (NORVASC) 5 MG tablet Take 5 mg by mouth daily. 11/14/13  Yes Historical Provider, MD  benazepril (LOTENSIN) 20 MG tablet Take 20 mg by mouth daily. 11/14/13  Yes Historical Provider, MD  acyclovir (ZOVIRAX) 400 MG tablet Take 1 tablet (400 mg total) by mouth 1 day or 1 dose. 09/15/13   Elson AreasLeslie K Sofia, PA-C  ciprofloxacin (CILOXAN) 0.3 % ophthalmic solution Place 2 drops into both eyes every 4 (four) hours while awake. Administer 1 drop, every 2 hours, while awake, for 2 days. Then 1 drop, every 4 hours, while awake, for the next 5 days. 11/21/13   York Spanielharles K Willis, MD  diclofenac sodium (VOLTAREN) 1 % GEL Apply 1 application topically 4 (four) times daily. To areas of pain 01/22/14   Hayden Rasmussenavid Mabe, NP  Fingolimod HCl (GILENYA) 0.5 MG CAPS Take 0.5 mg by mouth daily.  Historical Provider, MD  nortriptyline (PAMELOR) 10 MG capsule Take 1 capsule (10 mg total) by mouth at bedtime. 11/17/11 11/16/12  Etta Grandchild, MD  oxybutynin (DITROPAN XL) 10 MG 24 hr tablet Take 1 tablet (10 mg total) by mouth at bedtime. 11/21/13   York Spaniel, MD   BP 119/87  Pulse 86  Temp(Src) 98.2 F (36.8 C) (Oral)  Resp 14  SpO2 100% Physical Exam  Nursing note and vitals reviewed. Constitutional: She is oriented to person, place, and time. She appears well-developed and well-nourished. No distress.  HENT:  Head: Normocephalic and atraumatic.  Eyes: EOM are normal. Pupils are equal, round, and reactive to light.  Neck:  Tenderness to the left trapezius ridge and posterior body.  There is tenderness to the insertion site of the trapezius onto the left lateral neck. She has rotation to the left of 45 into the right 45. Flexion to 20 limited by pain. No spinal tenderness or deformity. Arm abduction limited to 90 DT pain over the proximal deltoid muscle. No joint or joint line tenderness.  Cardiovascular: Normal rate, regular rhythm and normal heart sounds.   Pulmonary/Chest: Effort normal and breath sounds normal. No respiratory distress.  Musculoskeletal: Normal range of motion. She exhibits no edema and no tenderness.  No spinal tenderness. Bilateral knees with full range of motion and normal strength. No swelling, discoloration or deformity. No tenderness. Bilateral knee exam is normal.  Lymphadenopathy:    She has no cervical adenopathy.  Neurological: She is alert and oriented to person, place, and time. No cranial nerve deficit.  Skin: Skin is warm and dry.  Psychiatric: She has a normal mood and affect.    ED Course  Procedures (including critical care time) Labs Review Labs Reviewed - No data to display  Imaging Review No results found.   MDM   1. Trapezius muscle strain   2. Overuse injury   3. Mechanical knee pain     Limit activities involving muscles of L shoulder back and knees Gradually start  slow exercise without resistance. Cold for a couple of days then heat Diclofenac gel       Hayden Rasmussen, NP 01/22/14 [id number]

## 2014-01-23 NOTE — ED Provider Notes (Signed)
Medical screening examination/treatment/procedure(s) were performed by a resident physician or non-physician practitioner and as the supervising physician I was immediately available for consultation/collaboration.  Evan Corey, MD    Evan S Corey, MD 01/23/14 0738 

## 2014-01-28 ENCOUNTER — Other Ambulatory Visit: Payer: Self-pay

## 2014-01-28 MED ORDER — FINGOLIMOD HCL 0.5 MG PO CAPS: 0.5000 mg | ORAL_CAPSULE | Freq: Every day | ORAL | Status: DC

## 2014-05-25 ENCOUNTER — Ambulatory Visit: Payer: 59 | Admitting: Neurology

## 2014-05-25 ENCOUNTER — Telehealth: Payer: Self-pay | Admitting: Neurology

## 2014-05-25 NOTE — Telephone Encounter (Signed)
This patient canceled her appointment 4 minutes prior to appointment date.

## 2014-08-04 ENCOUNTER — Telehealth: Payer: Self-pay | Admitting: Neurology

## 2014-08-04 ENCOUNTER — Ambulatory Visit: Payer: Self-pay | Admitting: Neurology

## 2014-08-04 NOTE — Telephone Encounter (Signed)
This is the second consecutive no-show for a revisit appointment.

## 2014-08-06 ENCOUNTER — Encounter: Payer: Self-pay | Admitting: Neurology

## 2014-08-16 ENCOUNTER — Emergency Department (INDEPENDENT_AMBULATORY_CARE_PROVIDER_SITE_OTHER)
Admission: EM | Admit: 2014-08-16 | Discharge: 2014-08-16 | Disposition: A | Discharge status: Home / Self Care | Payer: Medicare Other | Source: Home / Self Care | Attending: Family Medicine | Admitting: Family Medicine

## 2014-08-16 ENCOUNTER — Encounter (HOSPITAL_COMMUNITY): Payer: Self-pay | Admitting: *Deleted

## 2014-08-16 DIAGNOSIS — I10 Essential (primary) hypertension: Secondary | ICD-10-CM

## 2014-08-16 NOTE — Discharge Instructions (Signed)
Take your medicine and see your doctor for bp recheck this week.

## 2014-08-16 NOTE — ED Provider Notes (Signed)
CSN: [id number]     Arrival date & time 08/16/14  1534 History   First MD Initiated Contact with Patient 08/16/14 1551     Chief Complaint  Patient presents with  . Hypertension   (Consider location/radiation/quality/duration/timing/severity/associated sxs/prior Treatment) Patient is a 44 y.o. female presenting with hypertension. The history is provided by the patient.  Hypertension This is a chronic problem. The current episode started 6 to 12 hours ago (concerned about bp today.). The problem has been resolved. Associated symptoms include headaches. Pertinent negatives include no chest pain.    Past Medical History  Diagnosis Date  . MS (multiple sclerosis) 1992    Dr. Sandria Manly  . Depression   . Chronic daily headache 2003    Love  . Multiple sclerosis 11/21/2013  . Neurogenic bladder 11/21/2013   Past Surgical History  Procedure Laterality Date  . Tubal ligation    . Ablation colpoclesis  2004  . Cesarean section     Family History  Problem Relation Age of Onset  . Heart failure Father   . Multiple sclerosis Sister   . Cancer Neg Hx   . Diabetes Neg Hx   . Hyperlipidemia Neg Hx   . Hypertension Neg Hx   . Kidney disease Neg Hx   . Stroke Neg Hx    History  Substance Use Topics  . Smoking status: Never Smoker   . Smokeless tobacco: Never Used  . Alcohol Use: 4.2 oz/week    7 Glasses of wine per week   OB History    No data available     Review of Systems  Constitutional: Negative.   Respiratory: Negative for chest tightness.   Cardiovascular: Negative for chest pain and palpitations.  Neurological: Positive for headaches.    Allergies  Review of patient's allergies indicates no known allergies.  Home Medications   Prior to Admission medications   Medication Sig Start Date End Date Taking? Authorizing Provider  acyclovir (ZOVIRAX) 400 MG tablet Take 1 tablet (400 mg total) by mouth 1 day or 1 dose. 09/15/13   Elson Areas, PA-C  amLODipine (NORVASC) 5 MG  tablet Take 5 mg by mouth daily. 11/14/13   Historical Provider, MD  benazepril (LOTENSIN) 20 MG tablet Take 20 mg by mouth daily. 11/14/13   Historical Provider, MD  ciprofloxacin (CILOXAN) 0.3 % ophthalmic solution Place 2 drops into both eyes every 4 (four) hours while awake. Administer 1 drop, every 2 hours, while awake, for 2 days. Then 1 drop, every 4 hours, while awake, for the next 5 days. 11/21/13   York Spaniel, MD  diclofenac sodium (VOLTAREN) 1 % GEL Apply 1 application topically 4 (four) times daily. To areas of pain 01/22/14   Hayden Rasmussen, NP  Fingolimod HCl (GILENYA) 0.5 MG CAPS Take 1 capsule (0.5 mg total) by mouth daily. 01/28/14   York Spaniel, MD  nortriptyline (PAMELOR) 10 MG capsule Take 1 capsule (10 mg total) by mouth at bedtime. 11/17/11 11/16/12  Etta Grandchild, MD  oxybutynin (DITROPAN XL) 10 MG 24 hr tablet Take 1 tablet (10 mg total) by mouth at bedtime. 11/21/13   York Spaniel, MD   BP 141/92 mmHg  Pulse 70  Temp(Src) 98 F (36.7 C) (Oral)  Resp 16  SpO2 98% Physical Exam  Constitutional: She is oriented to person, place, and time. She appears well-developed and well-nourished. No distress.  Neck: Normal range of motion. Neck supple.  Cardiovascular: Regular rhythm, normal heart sounds and intact  distal pulses.   Pulmonary/Chest: Effort normal and breath sounds normal.  Musculoskeletal: She exhibits no edema.  Neurological: She is alert and oriented to person, place, and time.  Skin: Skin is warm and dry.  Nursing note and vitals reviewed.   ED Course  Procedures (including critical care time) Labs Review Labs Reviewed - No data to display  Imaging Review No results found.   MDM  No diagnosis found.     Linna Hoff, MD 08/16/14 [phone]

## 2014-08-16 NOTE — ED Notes (Signed)
Pt  Has  history  Of  htn   She  Reports  Symptoms  Of  Lightheaded    Dizzy    And  a  Headache    Since  Yesterday      She  Is  Here  For  For evaul of  Her  Symptoms   -  She  Is  Sitting upright   On  Exam table  Speaking in  Complete  sentances  And  Is  In no   Acute  Distress

## 2014-08-27 ENCOUNTER — Telehealth: Payer: Self-pay | Admitting: Neurology

## 2014-08-27 NOTE — Telephone Encounter (Signed)
Rx was previously prescribed by Elson Areas, PA-C.  Also, it appears the patient has no showed her last two appts at our office.  I called back.  She will contact PCP regarding refill.

## 2014-08-27 NOTE — Telephone Encounter (Signed)
Pt is calling requesting a refill on acyclovir (ZOVIRAX) 400 MG tablet. Please call and advise. She uses [facility] on [address]

## 2014-11-24 ENCOUNTER — Telehealth: Payer: Self-pay | Admitting: *Deleted

## 2014-11-24 NOTE — Telephone Encounter (Signed)
I called the patient. Appointment made for 4/20 at 0800.

## 2014-11-25 ENCOUNTER — Encounter: Payer: Self-pay | Admitting: Neurology

## 2014-11-25 ENCOUNTER — Telehealth: Payer: Self-pay | Admitting: Neurology

## 2014-11-25 ENCOUNTER — Ambulatory Visit (INDEPENDENT_AMBULATORY_CARE_PROVIDER_SITE_OTHER): Payer: Medicare Other | Admitting: Neurology

## 2014-11-25 VITALS — BP 129/87 | HR 78 | Ht 64.0 in | Wt 203.2 lb

## 2014-11-25 DIAGNOSIS — G35 Multiple sclerosis: Secondary | ICD-10-CM

## 2014-11-25 DIAGNOSIS — Z5181 Encounter for therapeutic drug level monitoring: Secondary | ICD-10-CM | POA: Diagnosis not present

## 2014-11-25 DIAGNOSIS — G35D Multiple sclerosis, unspecified: Secondary | ICD-10-CM

## 2014-11-25 DIAGNOSIS — N319 Neuromuscular dysfunction of bladder, unspecified: Secondary | ICD-10-CM | POA: Diagnosis not present

## 2014-11-25 DIAGNOSIS — G44221 Chronic tension-type headache, intractable: Secondary | ICD-10-CM | POA: Diagnosis not present

## 2014-11-25 DIAGNOSIS — R269 Unspecified abnormalities of gait and mobility: Secondary | ICD-10-CM

## 2014-11-25 HISTORY — DX: Unspecified abnormalities of gait and mobility: R26.9

## 2014-11-25 MED ORDER — TOPIRAMATE 25 MG PO TABS: ORAL_TABLET | ORAL | Status: DC

## 2014-11-25 MED ORDER — TRAZODONE HCL 50 MG PO TABS: 50.0000 mg | ORAL_TABLET | Freq: Every evening | ORAL | Status: DC | PRN

## 2014-11-25 MED ORDER — PREDNISONE 10 MG PO TABS: ORAL_TABLET | ORAL | Status: DC

## 2014-11-25 NOTE — Patient Instructions (Signed)

## 2014-11-25 NOTE — Progress Notes (Signed)
Reason for visit: Multiple sclerosis  Connie Robertson is an 44 y.o. female  History of present illness:  Connie Robertson is a 44 year old right-handed black female with a history of multiple sclerosis. The patient was last seen one year ago, she remains on Gilenya. She has not shown up for 2 revisit appointments, but she comes in today because she believes that she is having an exacerbation with her multiple sclerosis. She has not had any blood work done for one year. MRI evaluations of the brain and cervical spinal cord were ordered last year, but the patient never had studies done. She indicates that she has had ongoing issues with a neurogenic bladder. The patient has difficulty with sensing the need to use the bathroom, and she may have incontinence. The patient has been placed on oxybutynin, but her insurance would not cover this, and she was switched to Detrol. The patient has been taking double the recommended dose, 4 mg twice daily. The patient has noted a change in her ability to walk over the last 2 weeks. The patient feels heaviness with the legs, and she has some altered sensation with the left greater than right leg. She has fallen on one occasion. She denies any change in vision, speech, or swallowing. She continues to have issues with her bladder. The patient also reports daily headache events. The headaches are often times worse in the morning. The patient does have photophobia and phonophobia with the headache. She takes over-the-counter medications. In the past, she gained some benefit with Topamax, but her insurance would not cover the medication. She comes to this office for an evaluation. The patient reports problems with generalized fatigue. She has also had some episodes of dizziness, near syncope when she stoops over and then stands up. She may have the same thing when she gets out of bed in the morning. She has not had any actual blackouts.  Past Medical History  Diagnosis Date   . MS (multiple sclerosis) 1992    Dr. Sandria Robertson  . Depression   . Chronic daily headache 2003    [facility]  . Multiple sclerosis 11/21/2013  . Neurogenic bladder 11/21/2013  . Abnormality of gait 11/25/2014    Past Surgical History  Procedure Laterality Date  . Tubal ligation    . Ablation colpoclesis  2004  . Cesarean section      Family History  Problem Relation Age of Onset  . Heart failure Father   . Multiple sclerosis Sister   . Cancer Neg Hx   . Diabetes Neg Hx   . Hyperlipidemia Neg Hx   . Hypertension Neg Hx   . Kidney disease Neg Hx   . Stroke Neg Hx     Social history:  reports that she has never smoked. She has never used smokeless tobacco. She reports that she drinks about 0.6 oz of alcohol per week. She reports that she does not use illicit drugs.   No Known Allergies  Medications:  Prior to Admission medications   Medication Sig Start Date End Date Taking? Authorizing Provider  acyclovir (ZOVIRAX) 400 MG tablet Take 1 tablet (400 mg total) by mouth 1 day or 1 dose. 09/15/13  Yes Connie Skinner Sofia, PA-C  amLODipine (NORVASC) 5 MG tablet Take 5 mg by mouth daily. 11/14/13  Yes Historical Provider, MD  benazepril (LOTENSIN) 20 MG tablet Take 20 mg by mouth daily. 11/14/13  Yes Historical Provider, MD  Fingolimod HCl (GILENYA) 0.5 MG CAPS Take 1 capsule (0.5  mg total) by mouth daily. 01/28/14  Yes York Spaniel, MD  tolterodine (DETROL) 2 MG tablet Take 2 mg by mouth 2 (two) times daily.   Yes Historical Provider, MD    ROS:  Out of a complete 14 system review of symptoms, the patient complains only of the following symptoms, and all other reviewed systems are negative.  Fatigue Light sensitivity, blurred vision Heat intolerance Nausea Restless legs, frequent waking Achy muscles, walking difficulty Dizziness, headache, numbness, speech changes, weakness Confusion, depression, anxiety  Blood pressure 129/87, pulse 78, height  (1.626 m), weight 203 lb 3.2 oz  (92.171 kg).   Blood pressure, right arm, sitting is 138/92. Blood pressure, standing, right arm is 110/90.  Physical Exam  General: The patient is alert and cooperative at the time of the examination. The patient is minimally obese.  Skin: No significant peripheral edema is noted.   Neurologic Exam  Mental status: The patient is alert and oriented x 3 at the time of the examination. The patient has apparent normal recent and remote memory, with an apparently normal attention span and concentration ability.   Cranial nerves: Facial symmetry is present. Speech is normal, no aphasia or dysarthria is noted. Extraocular movements are full. Visual fields are full. Pupils are equal, round, and reactive to light. Discs are flat bilaterally.  Motor: The patient has good strength in all 4 extremities.  Sensory examination: Soft touch sensation is notable for decreased sensation on the left face, arm, and leg.  Coordination: The patient has good finger-nose-finger and heel-to-shin bilaterally.  Gait and station: The patient has a slightly wide-based gait. Tandem gait is slightly unsteady. Romberg is negative. No drift is seen.  Reflexes: Deep tendon reflexes are symmetric.   Assessment/Plan:  1. Multiple sclerosis, recent exacerbation  2. Gait disturbance  3. Neurogenic bladder  4. Chronic daily headache  5. Dizziness, probable orthostasis  The patient has had a recent change in her ability to walk. She may be having a mild exacerbation of her multiple stenosis. She will be placed on prednisone with the 10 mg 12 day Dosepak. She will be sent back for MRI evaluation of the brain and cervical spinal cord with and without gadolinium enhancement. Blood work will be done today, the patient is on Gilenya. She has not been compliant with her follow-ups, she has had 2 no show events within the last year. The patient will be sent for a urology evaluation given the neurogenic bladder. She will  be placed on Topamax for her chronic daily headache. She will follow-up in 5 or 6 months. The patient has mild orthostasis that may be related to overuse of the Detrol, she is to cut back to 2 mg twice daily, and some of the dizziness may improve.  Marlan Palau MD 11/25/2014 7:36 PM  [facility] [address] [place], [place] [address]  Phone [phone] Fax [phone]

## 2014-11-25 NOTE — Addendum Note (Signed)
Addended by: Stephanie Acre on: 11/25/2014 05:42 PM   Modules accepted: Orders

## 2014-11-25 NOTE — Telephone Encounter (Signed)
I called the patient. The patient needs a medication for chronic insomnia. I will write a prescription for trazodone.

## 2014-11-25 NOTE — Telephone Encounter (Signed)
Patient called wanted to check with Dr. Anne Hahn to see if he can Rx her anything for her to be able to sleep. Patient was seen today by Dr. Anne Hahn but forgot to ask him that while being seen. Please call and advice # [phone]

## 2014-11-25 NOTE — Telephone Encounter (Signed)
Closed encounter in error 

## 2014-11-26 ENCOUNTER — Telehealth: Payer: Self-pay | Admitting: Neurology

## 2014-11-26 ENCOUNTER — Telehealth: Payer: Self-pay

## 2014-11-26 LAB — COMPREHENSIVE METABOLIC PANEL
A/G RATIO: 1.9 (ref 1.1–2.5)
ALT: 24 IU/L (ref 0–32)
AST: 23 IU/L (ref 0–40)
Albumin: 4.4 g/dL (ref 3.5–5.5)
Alkaline Phosphatase: 47 IU/L (ref 39–117)
BILIRUBIN TOTAL: 0.4 mg/dL (ref 0.0–1.2)
BUN / CREAT RATIO: 15 (ref 9–23)
BUN: 16 mg/dL (ref 6–24)
CHLORIDE: 100 mmol/L (ref 97–108)
CO2: 23 mmol/L (ref 18–29)
Calcium: 9 mg/dL (ref 8.7–10.2)
Creatinine, Ser: 1.04 mg/dL — ABNORMAL HIGH (ref 0.57–1.00)
GFR, EST AFRICAN AMERICAN: 75 mL/min/{1.73_m2} (ref >59)
GFR, EST NON AFRICAN AMERICAN: 65 mL/min/{1.73_m2} (ref >59)
Globulin, Total: 2.3 g/dL (ref 1.5–4.5)
Glucose: 88 mg/dL (ref 65–99)
POTASSIUM: 3.7 mmol/L (ref 3.5–5.2)
SODIUM: 138 mmol/L (ref 134–144)
TOTAL PROTEIN: 6.7 g/dL (ref 6.0–8.5)

## 2014-11-26 LAB — CBC WITH DIFFERENTIAL/PLATELET
BASOS: 0 %
Basophils Absolute: 0 10*3/uL (ref 0.0–0.2)
EOS ABS: 0.1 10*3/uL (ref 0.0–0.4)
EOS: 5 %
HEMATOCRIT: 39.1 % (ref 34.0–46.6)
Hemoglobin: 12.8 g/dL (ref 11.1–15.9)
IMMATURE GRANS (ABS): 0 10*3/uL (ref 0.0–0.1)
Immature Granulocytes: 0 %
LYMPHS: 21 %
Lymphocytes Absolute: 0.4 10*3/uL — ABNORMAL LOW (ref 0.7–3.1)
MCH: 30 pg (ref 26.6–33.0)
MCHC: 32.7 g/dL (ref 31.5–35.7)
MCV: 92 fL (ref 79–97)
Monocytes Absolute: 0.3 10*3/uL (ref 0.1–0.9)
Monocytes: 13 %
NEUTROS ABS: 1.3 10*3/uL — AB (ref 1.4–7.0)
Neutrophils Relative %: 61 %
Platelets: 198 10*3/uL (ref 150–379)
RBC: 4.26 x10E6/uL (ref 3.77–5.28)
RDW: 13.6 % (ref 12.3–15.4)
WBC: 2.2 10*3/uL — CL (ref 3.4–10.8)

## 2014-11-26 NOTE — Telephone Encounter (Signed)
-----   Message from York Spaniel, MD sent at 11/26/2014  7:48 AM EDT -----  The blood work results are unremarkable. WBC low as expected on Gilenya. Please call the patient. ----- Message -----    From: [facility] Lab Results In Interface    Sent: 11/26/2014   5:44 AM      To: York Spaniel, MD

## 2014-11-26 NOTE — Telephone Encounter (Signed)
Patient spoke with her pharmacy and they informed her that she had been given two Rx's. ( One to help her sleep and one that is a steroid). She is confused about the steroid and would like to know why she has the Rx. Please call and advise. DW

## 2014-11-26 NOTE — Telephone Encounter (Signed)
Spoke to patient and relayed results 

## 2014-11-26 NOTE — Telephone Encounter (Signed)
OV note from yesterday says: The patient has had a recent change in her ability to walk. She may be having a mild exacerbation of her multiple stenosis. She will be placed on prednisone with the 10 mg 12 day Dosepak I called back.  Got no answer.  Left message.

## 2014-11-27 ENCOUNTER — Telehealth: Payer: Self-pay | Admitting: Neurology

## 2014-11-27 MED ORDER — TOPIRAMATE 25 MG PO TABS: ORAL_TABLET | ORAL | Status: DC

## 2014-11-27 NOTE — Telephone Encounter (Signed)
Patient called that her Pharmacy, [facility] on [address] did not have her script for topiramate (TOPAMAX) 25 MG tablet. Please call and advice. [phone]

## 2014-11-27 NOTE — Telephone Encounter (Signed)
Rx has been resent; pharmacy confirmed receipt.  I called the patient back to advise.  She is aware.

## 2014-12-01 DIAGNOSIS — G35 Multiple sclerosis: Secondary | ICD-10-CM

## 2014-12-05 ENCOUNTER — Telehealth: Payer: Self-pay | Admitting: Neurology

## 2014-12-05 ENCOUNTER — Other Ambulatory Visit: Payer: Self-pay | Admitting: Diagnostic Neuroimaging

## 2014-12-05 DIAGNOSIS — N319 Neuromuscular dysfunction of bladder, unspecified: Secondary | ICD-10-CM

## 2014-12-05 DIAGNOSIS — G35D Multiple sclerosis, unspecified: Secondary | ICD-10-CM

## 2014-12-05 DIAGNOSIS — R269 Unspecified abnormalities of gait and mobility: Secondary | ICD-10-CM

## 2014-12-05 DIAGNOSIS — G35 Multiple sclerosis: Secondary | ICD-10-CM

## 2014-12-05 DIAGNOSIS — G44221 Chronic tension-type headache, intractable: Secondary | ICD-10-CM

## 2014-12-05 NOTE — Telephone Encounter (Signed)
I called patient. The MRI the brain does not show active lesions. The last MRI was done 6 years ago, the comparison is not available. The patient is on Gilenya, she is to remain on this. She is not hurting by her urology referral yet, will need to find out about this issue.   MRI cervical 12/04/14:  IMPRESSION:  Abnormal MRI cervical spine (with and without) demonstrating: 1. Right C2 antero-lateral and left C3 postero-lateral chronic demyelinating plaques.  2. No abnormal enhancing lesions.   MRI brain 12/04/14:  IMPRESSION:  Abnormal MRI brain (with and without) demonstrating: 1. Mild-moderate, scattered periventricular and subcortical and juxtacortical, round and ovoid, foci of T2 hyperintensities. Some of these are hypointense on T1 views. No abnormal lesions are seen on post contrast views. Findings can be consistent with patient's diagnosis of chronic demyelinating disease. 2. No acute findings.

## 2014-12-07 NOTE — Telephone Encounter (Signed)
I called [facility]. They said the patient needed to call their billing department before an appointment could be scheduled. I called the patient to let her know this and gave her the number to call. She will call our office if she has any trouble setting up this appointment.

## 2014-12-14 ENCOUNTER — Other Ambulatory Visit: Payer: Medicare Other

## 2014-12-21 ENCOUNTER — Telehealth: Payer: Self-pay | Admitting: Neurology

## 2014-12-21 NOTE — Telephone Encounter (Signed)
Patient called and requested to speak with the nurse regarding an upcoming oral surgery. She wants to make sure that the surgery isn't going to interfere with any of the medication she is on. Please call and advise.

## 2014-12-21 NOTE — Telephone Encounter (Signed)
The patient is seeing Dr. Gwendlyn Deutscher for oral surgery, Amoxacillin and hydrocodone will be given. OK to take with Gilenya.

## 2015-02-16 ENCOUNTER — Other Ambulatory Visit: Payer: Self-pay | Admitting: Neurology

## 2015-02-16 NOTE — Telephone Encounter (Signed)
Per note from 04/20

## 2015-02-19 ENCOUNTER — Other Ambulatory Visit: Payer: Self-pay | Admitting: Neurology

## 2015-03-12 ENCOUNTER — Telehealth: Payer: Self-pay | Admitting: Hematology

## 2015-03-12 NOTE — Telephone Encounter (Signed)
NEW PATIENT APPT-S/W PATIENT AND GAVE NP APPT FOR 08/12 @ 11 W/DR. KALE.  REFERRING DR. Lorin Picket LONG DX-LEUKOPENIA   REFERRAL INFORMATION SCANNED

## 2015-03-19 ENCOUNTER — Other Ambulatory Visit: Payer: Self-pay | Admitting: *Deleted

## 2015-03-19 ENCOUNTER — Ambulatory Visit: Payer: Medicare Other

## 2015-03-19 ENCOUNTER — Encounter: Payer: Medicare Other | Admitting: Hematology

## 2015-03-19 ENCOUNTER — Other Ambulatory Visit: Payer: Medicare Other

## 2015-03-19 NOTE — Progress Notes (Signed)
This encounter was created in error - please disregard.

## 2015-03-19 NOTE — Progress Notes (Signed)
Patient missed appointment. Patient will call back 03/22/15 to reschedule.

## 2015-04-02 ENCOUNTER — Telehealth: Payer: Self-pay | Admitting: Hematology

## 2015-04-02 NOTE — Telephone Encounter (Signed)
S/w patient and r/s np appt for 08/29 @ 12:30 w/Dr. Candise Che

## 2015-04-05 ENCOUNTER — Telehealth: Payer: Self-pay | Admitting: Hematology

## 2015-04-05 ENCOUNTER — Ambulatory Visit (HOSPITAL_BASED_OUTPATIENT_CLINIC_OR_DEPARTMENT_OTHER): Payer: Medicare Other | Admitting: Hematology

## 2015-04-05 ENCOUNTER — Other Ambulatory Visit (HOSPITAL_BASED_OUTPATIENT_CLINIC_OR_DEPARTMENT_OTHER): Payer: Medicare Other

## 2015-04-05 ENCOUNTER — Ambulatory Visit: Payer: Medicare Other

## 2015-04-05 ENCOUNTER — Encounter: Payer: Self-pay | Admitting: Hematology

## 2015-04-05 VITALS — BP 127/73 | HR 65 | Temp 98.6°F | Resp 18 | Ht 64.0 in | Wt 197.8 lb

## 2015-04-05 DIAGNOSIS — D72810 Lymphocytopenia: Secondary | ICD-10-CM

## 2015-04-05 DIAGNOSIS — D709 Neutropenia, unspecified: Secondary | ICD-10-CM | POA: Diagnosis not present

## 2015-04-05 DIAGNOSIS — D72819 Decreased white blood cell count, unspecified: Secondary | ICD-10-CM

## 2015-04-05 LAB — COMPREHENSIVE METABOLIC PANEL (CC13)
ALBUMIN: 4.2 g/dL (ref 3.5–5.0)
ALK PHOS: 53 U/L (ref 40–150)
ALT: 46 U/L (ref 0–55)
AST: 27 U/L (ref 5–34)
Anion Gap: 7 mEq/L (ref 3–11)
BUN: 18.2 mg/dL (ref 7.0–26.0)
CALCIUM: 9.3 mg/dL (ref 8.4–10.4)
CO2: 24 mEq/L (ref 22–29)
Chloride: 111 mEq/L — ABNORMAL HIGH (ref 98–109)
Creatinine: 1.1 mg/dL (ref 0.6–1.1)
EGFR: 58 mL/min/{1.73_m2} — AB (ref >90)
Glucose: 89 mg/dl (ref 70–140)
POTASSIUM: 3.9 meq/L (ref 3.5–5.1)
Sodium: 142 mEq/L (ref 136–145)
Total Bilirubin: 0.22 mg/dL (ref 0.20–1.20)
Total Protein: 6.9 g/dL (ref 6.4–8.3)

## 2015-04-05 LAB — CBC & DIFF AND RETIC
BASO%: 0.5 % (ref 0.0–2.0)
BASOS ABS: 0 10*3/uL (ref 0.0–0.1)
EOS%: 1.5 % (ref 0.0–7.0)
Eosinophils Absolute: 0 10*3/uL (ref 0.0–0.5)
HEMATOCRIT: 35.4 % (ref 34.8–46.6)
HEMOGLOBIN: 12.1 g/dL (ref 11.6–15.9)
Immature Retic Fract: 4.9 % (ref 1.60–10.00)
LYMPH%: 13 % — ABNORMAL LOW (ref 14.0–49.7)
MCH: 31.3 pg (ref 25.1–34.0)
MCHC: 34.2 g/dL (ref 31.5–36.0)
MCV: 91.5 fL (ref 79.5–101.0)
MONO#: 0.2 10*3/uL (ref 0.1–0.9)
MONO%: 12 % (ref 0.0–14.0)
NEUT#: 1.5 10*3/uL (ref 1.5–6.5)
NEUT%: 73 % (ref 38.4–76.8)
Platelets: 177 10*3/uL (ref 145–400)
RBC: 3.87 10*6/uL (ref 3.70–5.45)
RDW: 13 % (ref 11.2–14.5)
Retic %: 1.31 % (ref 0.70–2.10)
Retic Ct Abs: 50.7 10*3/uL (ref 33.70–90.70)
WBC: 2 10*3/uL — ABNORMAL LOW (ref 3.9–10.3)
lymph#: 0.3 10*3/uL — ABNORMAL LOW (ref 0.9–3.3)
nRBC: 0 % (ref 0–0)

## 2015-04-05 LAB — CHCC SMEAR

## 2015-04-05 LAB — LACTATE DEHYDROGENASE (CC13): LDH: 157 U/L (ref 125–245)

## 2015-04-05 NOTE — Telephone Encounter (Signed)
per pof pt to come back as needed

## 2015-04-05 NOTE — Progress Notes (Signed)
Checked in patient with blood concern. Pt may not need my services but has my card for any financial questions or concerns. Pt has 2 insurances.

## 2015-04-05 NOTE — Progress Notes (Signed)
[facility]    HEMATOLOGY/ONCOLOGY CONSULTATION NOTE  Date of Service: 04/05/2015  Patient Care Team: Elvera Lennox, PA-C as PCP - General (Physician Assistant)  CHIEF COMPLAINTS/PURPOSE OF CONSULTATION:  Evaluation and management of leukopenia  HISTORY OF PRESENTING ILLNESS:  Connie Robertson is a wonderful 44 y.o. female who has been referred to [place] by Dr .Elvera Lennox, PA-C   for evaluation and management of leukopenia.  Patient has a history of multiple sclerosis since age 34 and has been treated by multiple immunomodulatory therapies. She has been on Gilenya for the last 3-4 years and notes that her white counts have always been low. She notes no issues with low white count as a child or history of frequent child infections such as pneumonia/sinusitis/skin infections or other episodes of sepsis.  Labs available in our electronic medical records suggest low total WBC count and ANC from at least 2013. Patient notes no issues with frequent infections. No unexplained weight loss. No fevers/chills/night sweats or any enlarged lymph nodes. Notes that she is very driven to try to maintain her physical health despite her multiple sclerosis and notes that she works out regularly.  Her last CBC showed WBC count of 2.1 with ANC of 1.5. Normal hemoglobin and platelet counts. Also noted to have some lymphopenia which could be from her on and off use of steroids and her immunomodulatory medications.  No skin rashes/new arthritis or arthralgias/mouth sores.  Denies any other recent new medications. Denies any symptoms suggestive of a viral prodrome. She has been a lifelong nonsmoker. Minimal social alcohol use. Denies any other drug use.  No family history of blood disorders/risks of infection or low blood counts.   She feels well overall and is not very concerned about her low blood counts because " it's been like that for a long time". Notes some muscle tightness in her thighs after exercise.  MEDICAL  HISTORY:  Past Medical History  Diagnosis Date  . MS (multiple sclerosis) 1992    Dr. Sandria Manly  . Depression   . Chronic daily headache 2003    Love  . Multiple sclerosis 11/21/2013  . Neurogenic bladder 11/21/2013  . Abnormality of gait 11/25/2014    SURGICAL HISTORY: Past Surgical History  Procedure Laterality Date  . Tubal ligation    . Ablation colpoclesis  2004  . Cesarean section      SOCIAL HISTORY: Social History   Social History  . Marital Status: Divorced    Spouse Name: N/A  . Number of Children: 2  . Years of Education: college   Occupational History  . disability    Social History Main Topics  . Smoking status: Never Smoker   . Smokeless tobacco: Never Used  . Alcohol Use: 0.6 oz/week    1 Glasses of wine per week  . Drug Use: No  . Sexual Activity: Not Currently    Birth Control/ Protection: Surgical   Other Topics Concern  . Not on file   Social History Narrative   Patient is right handed.   Patient drinks 2 cups of coffee daily.    FAMILY HISTORY: Family History  Problem Relation Age of Onset  . Heart failure Father   . Multiple sclerosis Sister   . Cancer Neg Hx   . Diabetes Neg Hx   . Hyperlipidemia Neg Hx   . Hypertension Neg Hx   . Kidney disease Neg Hx   . Stroke Neg Hx     ALLERGIES:  has No Known Allergies.  MEDICATIONS:  Current Outpatient Prescriptions  Medication Sig Dispense Refill  . acyclovir (ZOVIRAX) 400 MG tablet Take 1 tablet (400 mg total) by mouth 1 day or 1 dose. 90 tablet 0  . amLODipine (NORVASC) 5 MG tablet Take 5 mg by mouth daily.    . benazepril (LOTENSIN) 20 MG tablet Take 20 mg by mouth daily.    . Fingolimod HCl (GILENYA) 0.5 MG CAPS Take 1 capsule (0.5 mg total) by mouth daily. 30 capsule 6  . predniSONE (DELTASONE) 10 MG tablet Begin taking 6 tablets daily, taper by one tablet every other day until off the medication. 42 tablet 0  . tolterodine (DETROL) 2 MG tablet Take 2 mg by mouth 2 (two) times daily.     Marland Kitchen topiramate (TOPAMAX) 25 MG tablet TAKE 1 TABLET BY MOUTH AT NIGHT FOR 7 DAYS, 2 TABLETS AT NIGHT FOR 7DAY, 3 TABLETS NIGHTLY 270 tablet 0  . traZODone (DESYREL) 50 MG tablet TAKE 1 TABLET(50 MG) BY MOUTH AT BEDTIME AS NEEDED FOR SLEEP 90 tablet 0  . [DISCONTINUED] nebivolol (BYSTOLIC) 10 MG tablet Take 1 tablet (10 mg total) by mouth daily. 90 tablet 0   No current facility-administered medications for this visit.    REVIEW OF SYSTEMS:    10 Point review of Systems was done is negative except as noted above.  PHYSICAL EXAMINATION: ECOG PERFORMANCE STATUS: 1 - Symptomatic but completely ambulatory  . Filed Vitals:   04/05/15 1313  Height:  (1.626 m)  Weight: 197 lb 12.8 oz (89.721 kg)   Filed Weights   04/05/15 1313  Weight: 197 lb 12.8 oz (89.721 kg)   .Body mass index is 33.94 kg/(m^2).  GENERAL:alert, in no acute distress and comfortable SKIN: skin color, texture, turgor are normal, no rashes or significant lesions EYES: normal, conjunctiva are pink and non-injected, sclera clear OROPHARYNX:no exudate, no erythema and lips, buccal mucosa, and tongue normal  NECK: supple, no JVD, thyroid normal size, non-tender, without nodularity LYMPH:  no palpable lymphadenopathy in the cervical, axillary or inguinal LUNGS: clear to auscultation with normal respiratory effort HEART: regular rate & rhythm,  no murmurs and no lower extremity edema ABDOMEN: abdomen soft, non-tender, normoactive bowel sounds, no hepatosplenomegaly   Musculoskeletal: no cyanosis of digits and no clubbing  PSYCH: alert & oriented x 3 with fluent speech NEURO: no Overt motor weakness. Patient independently ambulatory .  LABORATORY DATA:  I have reviewed the data as listed  . CBC Latest Ref Rng 11/25/2014 11/21/2013 08/18/2011  WBC 3.4 - 10.8 x10E3/uL 2.2(LL) 2.0(LL) 2.6(L)  Hemoglobin 11.1 - 15.9 g/dL 40.9 81.1 91.4  Hematocrit 34.0 - 46.6 % 39.1 38.0 34.7(L)  Platelets 150 - 379 x10E3/uL 198 213  163.0   . CBC    Component Value Date/Time   WBC 2.0* 04/05/2015 1434   WBC 2.2* 11/25/2014 0858   WBC 2.6* 08/18/2011 1558   RBC 3.87 04/05/2015 1434   RBC 4.26 11/25/2014 0858   RBC 3.79* 08/18/2011 1558   HGB 12.1 04/05/2015 1434   HGB 12.8 11/25/2014 0858   HCT 35.4 04/05/2015 1434   HCT 39.1 11/25/2014 0858   PLT 177 04/05/2015 1434   PLT 198 11/25/2014 0858   MCV 91.5 04/05/2015 1434   MCV 92 11/25/2014 0858   MCH 31.3 04/05/2015 1434   MCH 30.0 11/25/2014 0858   MCHC 34.2 04/05/2015 1434   MCHC 32.7 11/25/2014 0858   MCHC 35.0 08/18/2011 1558   RDW 13.0 04/05/2015 1434   RDW 13.6 11/25/2014 0858  RDW 14.4 08/18/2011 1558   LYMPHSABS 0.3* 04/05/2015 1434   LYMPHSABS 0.4* 11/25/2014 0858   LYMPHSABS 0.3* 08/18/2011 1558   MONOABS 0.2 04/05/2015 1434   MONOABS 0.4 08/18/2011 1558   EOSABS 0.0 04/05/2015 1434   EOSABS 0.0 08/18/2011 1558   BASOSABS 0.0 04/05/2015 1434   BASOSABS 0.0 11/25/2014 0858   BASOSABS 0.0 08/18/2011 1558     . CMP Latest Ref Rng 11/25/2014 11/21/2013 08/18/2011  Glucose 65 - 99 mg/dL 88 82 91  BUN 6 - 24 mg/dL 16 14 19   Creatinine 0.57 - 1.00 mg/dL 6.57(Q) 4.69(G) 1.1  Sodium 134 - 144 mmol/L 138 139 142  Potassium 3.5 - 5.2 mmol/L 3.7 4.3 4.3  Chloride 97 - 108 mmol/L 100 103 107  CO2 18 - 29 mmol/L 23 28 28   Calcium 8.7 - 10.2 mg/dL 9.0 9.7 9.5  Total Protein 6.0 - 8.5 g/dL 6.7 6.9 6.7  Albumin 3.5 - 5.5 g/dL 4.4 4.6 -  Total Bilirubin 0.0 - 1.2 mg/dL 0.4 0.3 0.3  Alkaline Phos 39 - 117 IU/L 47 53 51  AST 0 - 40 IU/L 23 27 28   ALT 0 - 32 IU/L 24 26 33       . Lab Results  Component Value Date   LDH 157 04/05/2015   B12: 470   RADIOGRAPHIC STUDIES: I have personally reviewed the radiological images as listed and agreed with the findings in the report. No results found.  ASSESSMENT & PLAN:   44 year old African-American female referred for evaluation of  #1 leukopenia with mild neutropenia and lymphopenia. 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 The predominant factor causing her low counts upper stability medications with Gilenya known to cause leukopenia. The patient appears to be on this medication for several years with chronic stable low white counts and no overt issues with significant infections.  Peripheral blood smear was reviewed by me and showed normocytic normochromic red cells with no increased schistocytes, normal platelet numbers with no platelet clumping, somewhat decreased neutrophils. No blasts. A rare LGL's.  No constitutional symptoms and normal LDH with no evidence of lymphadenopathy or hepatosplenomegaly makes lymphoma less likely.  Plan -Though her leukopenia is likely related to her Gilenya, since it is stable and has not translated to recurrent or significant infections and the fact that this is an important medication to keep her multiple sclerosis in check.. I would recommend monitoring her counts at this time and not discontinuing her medication unless worsening counts are noted. -We'll get a B12 and copper level. -Patient has no overt symptomatology or obvious risk factors for HIV or hepatitis but might be reasonable to have HIV and hepatitis C labs checked with her next lab draw with her primary care physician. -Avoid other medications causing neutropenia. -Kindly reconsult [place] if worsening leukopenia/neutropenia or other cytopenias are noted. -No indication for G-CSF at this time. -Continue follow-up with primary care physician and neurology.  I appreciate the privilege of taking care of this wonderful patient.  All of the patients questions were answered with apparent satisfaction. The patient knows to call the clinic with any problems, questions or concerns.  I spent 40 minutes counseling the patient face to face. The total time spent in the appointment was 60 minutes and more than 50% was on  counseling and direct patient cares.    Wyvonnia Lora MD MS AAHIVMS [facility] Hematology/Oncology Physician [facility]  (Office):  [phone] (Work cell):  [phone] (Fax):           [phone]  04/05/2015 1:15 PM

## 2015-04-06 ENCOUNTER — Encounter: Payer: Self-pay | Admitting: Hematology

## 2015-04-07 LAB — COPPER, SERUM: COPPER: 99 ug/dL (ref 70–175)

## 2015-04-07 LAB — VITAMIN B12: Vitamin B-12: 470 pg/mL (ref 211–911)

## 2015-05-10 ENCOUNTER — Other Ambulatory Visit (HOSPITAL_COMMUNITY)
Admission: RE | Admit: 2015-05-10 | Discharge: 2015-05-10 | Disposition: A | Discharge status: Home / Self Care | Payer: Medicare Other | Source: Ambulatory Visit | Attending: Medical | Admitting: Medical

## 2015-05-10 ENCOUNTER — Ambulatory Visit (INDEPENDENT_AMBULATORY_CARE_PROVIDER_SITE_OTHER): Payer: Medicare Other | Admitting: Medical

## 2015-05-10 ENCOUNTER — Encounter: Payer: Self-pay | Admitting: Medical

## 2015-05-10 VITALS — BP 97/51 | HR 76 | Temp 99.2°F | Ht 64.0 in | Wt 195.4 lb

## 2015-05-10 DIAGNOSIS — N898 Other specified noninflammatory disorders of vagina: Secondary | ICD-10-CM | POA: Diagnosis not present

## 2015-05-10 DIAGNOSIS — B3731 Acute candidiasis of vulva and vagina: Secondary | ICD-10-CM

## 2015-05-10 DIAGNOSIS — B373 Candidiasis of vulva and vagina: Secondary | ICD-10-CM | POA: Diagnosis not present

## 2015-05-10 DIAGNOSIS — Z113 Encounter for screening for infections with a predominantly sexual mode of transmission: Secondary | ICD-10-CM | POA: Diagnosis present

## 2015-05-10 MED ORDER — FLUCONAZOLE 150 MG PO TABS: 150.0000 mg | ORAL_TABLET | Freq: Once | ORAL | Status: DC

## 2015-05-10 NOTE — Patient Instructions (Signed)

## 2015-05-10 NOTE — Progress Notes (Signed)
Patient ID: Connie Robertson, female   DOB: 08-Aug-1970, 44 y.o.   MRN: [id number]  History:  [name] is a 44 y.o. [id number] who presents to clinic today for vaginal discharge x 2 months. She states that the discharge is white, thin and malodorous. She denies vaginal bleeding since her ablation. She is sexually active with 2 partners and uses condoms most of the time. She denies associated abdominal pain. She does state that she works out at [employer] a lot and does not shower or change her clothes until later in the day.    The following portions of the patient's history were reviewed and updated as appropriate: allergies, current medications, family history, past medical history, social history, past surgical history and problem list.  Review of Systems:  Other than those mentioned in HPI all ROS negative  Objective:  Physical Exam BP 97/51 mmHg  Pulse 76  Temp(Src) 99.2 F (37.3 C)  Ht 5\' 4"  (1.626 m)  Wt 195 lb 6.4 oz (88.633 kg)  BMI 33.52 kg/m2 CONSTITUTIONAL: Well-developed, well-nourished female in no acute distress.  EYES: EOM intact, conjunctivae normal, no scleral icterus HEAD: Normocephalic, atraumatic ENT: External right and left ear normal, oropharynx is clear and moist. CARDIOVASCULAR: Normal heart rate noted. No cyanosis or edema.  RESPIRATORY: Effort normal, no problems with respiration noted. GASTROINTESTINAL:Soft, normal bowel sounds, no distention noted.  No tenderness, rebound or guarding.  GENITOURINARY: Normal appearing external genitalia; normal appearing vaginal mucosa and cervix.  Moderate amount of thick, clumpy discharge.  Wet prep and GC/Chlamydia obtained.  Normal uterine size, no other palpable masses, no uterine or adnexal tenderness.  MUSCULOSKELETAL: Normal range of motion.  SKIN: Skin is warm and dry. No rash noted. Not diaphoretic. No erythema. No pallor. NEUROLGIC: Alert and oriented to person, place, and time. Normal muscle tone,  coordination.  PSYCHIATRIC: Normal mood and affect. Normal behavior. Normal judgment and thought content. HEM/LYMPH/IMMUNOLOGIC: Neck supple, no masses.   Labs and Imaging Wet prep GC/Chlamydia  Assessment & Plan:  Assessment: Yeast vulvovaginitis, clinical Vaginal discharge  Plans: Wet prep and GC/Chlamydia obtained today. Patient will be contacted with any abnormal results Rx for Diflucan sent to patient's pharmacy Patient to return to [facility] as needed  Marny Lowenstein, PA-C 05/10/2015 3:07 PM

## 2015-05-11 ENCOUNTER — Other Ambulatory Visit: Payer: Self-pay | Admitting: Medical

## 2015-05-11 DIAGNOSIS — N760 Acute vaginitis: Principal | ICD-10-CM

## 2015-05-11 DIAGNOSIS — B9689 Other specified bacterial agents as the cause of diseases classified elsewhere: Secondary | ICD-10-CM

## 2015-05-11 LAB — WET PREP, GENITAL
TRICH WET PREP: NONE SEEN
WBC, Wet Prep HPF POC: NONE SEEN
YEAST WET PREP: NONE SEEN

## 2015-05-11 MED ORDER — METRONIDAZOLE 500 MG PO TABS: 500.0000 mg | ORAL_TABLET | Freq: Two times a day (BID) | ORAL | Status: DC

## 2015-05-12 ENCOUNTER — Telehealth: Payer: Self-pay | Admitting: General Practice

## 2015-05-12 LAB — GC/CHLAMYDIA PROBE AMP (~~LOC~~) NOT AT ARMC
Chlamydia: NEGATIVE
Neisseria Gonorrhea: NEGATIVE

## 2015-05-12 NOTE — Telephone Encounter (Signed)
Per Raynelle Fanning patient has BV and flagyl has been sent to pharmacy. Called patient and informed her of results and medication sent to pharmacy. Patient verbalized understanding and had no questions

## 2015-05-27 ENCOUNTER — Ambulatory Visit (INDEPENDENT_AMBULATORY_CARE_PROVIDER_SITE_OTHER): Payer: Medicare Other | Admitting: Neurology

## 2015-05-27 ENCOUNTER — Encounter: Payer: Self-pay | Admitting: Neurology

## 2015-05-27 VITALS — BP 132/94 | HR 70 | Ht 64.0 in | Wt 193.0 lb

## 2015-05-27 DIAGNOSIS — Z5181 Encounter for therapeutic drug level monitoring: Secondary | ICD-10-CM | POA: Diagnosis not present

## 2015-05-27 DIAGNOSIS — R269 Unspecified abnormalities of gait and mobility: Secondary | ICD-10-CM

## 2015-05-27 DIAGNOSIS — N319 Neuromuscular dysfunction of bladder, unspecified: Secondary | ICD-10-CM | POA: Diagnosis not present

## 2015-05-27 DIAGNOSIS — G44221 Chronic tension-type headache, intractable: Secondary | ICD-10-CM | POA: Diagnosis not present

## 2015-05-27 DIAGNOSIS — G35 Multiple sclerosis: Secondary | ICD-10-CM | POA: Diagnosis not present

## 2015-05-27 MED ORDER — TOPIRAMATE 50 MG PO TABS: 150.0000 mg | ORAL_TABLET | Freq: Every day | ORAL | Status: DC

## 2015-05-27 MED ORDER — TRAZODONE HCL 100 MG PO TABS: 100.0000 mg | ORAL_TABLET | Freq: Every day | ORAL | Status: DC

## 2015-05-27 NOTE — Patient Instructions (Addendum)
  We will go up on the topamax to 100 mg at night for 2 weeks, then take 150 mg at night (I have called in the 50 mg tablets, take 3 at night). We will increase the trazodone to 100 mg at night for sleep.  Topamax (topiramate) is a seizure medication that has an FDA approval for seizures and for migraine headache. Potential side effects of this medication include weight loss, cognitive slowing, tingling in the fingers and toes, and carbonated drinks will taste bad. If any significant side effects are noted on this drug, please contact our office.   Multiple Sclerosis Multiple sclerosis (MS) is a disease of the central nervous system. It leads to the loss of the insulating covering of the nerves (myelin sheath) of your brain. When this happens, brain signals do not get sent properly or may not get sent at all. The age of onset of MS varies.  CAUSES The cause of MS is unknown. However, it is more common in the [place] than in the [place]. RISK FACTORS There is a higher number of women with MS than men. MS is not an illness that is passed down to you from your family members (inherited). However, your risk of MS is higher if you have a relative with MS. SIGNS AND SYMPTOMS  The symptoms of MS occur in episodes or attacks. These attacks may last weeks to months. There may be long periods of almost no symptoms between attacks. The symptoms of MS vary. This is because of the many different ways it affects the central nervous system. The main symptoms of MS include:  Vision problems and eye pain.  Numbness.  Weakness.  Inability to move your arms, hands, feet, or legs (paralysis).  Balance problems.  Tremors. DIAGNOSIS  Your health care provider can diagnose MS with the help of imaging exams and lab tests. These may include specialized X-ray exams and spinal fluid tests. The best imaging exam to confirm a diagnosis of MS is an MRI. TREATMENT  There is no known cure  for MS, but there are medicines that can decrease the number and frequency of attacks. Steroids are often used for short-term relief. Physical and occupational therapy may also help. There are also many new alternative or complementary treatments available to help control the symptoms of MS. Ask your health care provider if any of these other options are right for you. HOME CARE INSTRUCTIONS   Take medicines as directed by your health care provider.  Exercise as directed by your health care provider. SEEK MEDICAL CARE IF: You begin to feel depressed. SEEK IMMEDIATE MEDICAL CARE IF:  You develop paralysis.  You have problems with bladder, bowel, or sexual function.  You develop mental changes, such as forgetfulness or mood swings.  You have a period of uncontrolled movements (seizure).   This information is not intended to replace advice given to you by your health care provider. Make sure you discuss any questions you have with your health care provider.   Document Released: 07/21/2000 Document Revised: 07/29/2013 Document Reviewed: 03/31/2013 Elsevier Interactive Patient Education [employer]

## 2015-05-27 NOTE — Progress Notes (Signed)
Reason for visit: Multiple sclerosis  Connie Robertson is an 44 y.o. female  History of present illness:  Connie Robertson is a 44 year old right-handed black female with a history of multiple sclerosis. The patient is on Gilenya, she is tolerating the medication well. The patient was out in the heat yesterday, this seemed to wipe her out significantly, the patient is doing better today. The patient has some chronic issues with fatigue, insomnia. The patient takes trazodone 50 mg at night for sleep which is not completely effective. She also is having chronic daily headaches, she has had headaches since a pregnancy 15 years ago. The patient does have some photophobia and phonophobia with the headache. The patient works through the headache, she is rarely incapacitated with the headache. The patient has not noted a new numbness, weakness episodes. The patient was referred to urology when last seen, she never followed up with this appointment. She continues have some issues with an urgency of the bladder. The patient has prominent issues with autoimmune diseases in the family. The patient has a sister with multiple sclerosis, a niece with type 1 diabetes, and the mother has rheumatoid arthritis. The patient returns for an evaluation.  Past Medical History  Diagnosis Date  . MS (multiple sclerosis) (HCC) 1992    Dr. Sandria Manly  . Depression   . Chronic daily headache 2003    [facility]  . Multiple sclerosis (HCC) 11/21/2013  . Neurogenic bladder 11/21/2013  . Abnormality of gait 11/25/2014    Past Surgical History  Procedure Laterality Date  . Tubal ligation    . Ablation colpoclesis  2004  . Cesarean section      Family History  Problem Relation Age of Onset  . Heart failure Father   . Multiple sclerosis Sister   . Cancer Neg Hx   . Diabetes Neg Hx   . Hyperlipidemia Neg Hx   . Hypertension Neg Hx   . Kidney disease Neg Hx   . Stroke Neg Hx     Social history:  reports that she has never  smoked. She has never used smokeless tobacco. She reports that she drinks about 1.2 oz of alcohol per week. She reports that she does not use illicit drugs.   No Known Allergies  Medications:  Prior to Admission medications   Medication Sig Start Date End Date Taking? Authorizing Provider  acyclovir (ZOVIRAX) 400 MG tablet Take 1 tablet (400 mg total) by mouth 1 day or 1 dose. 09/15/13  Yes Lonia Skinner Sofia, PA-C  amLODipine (NORVASC) 5 MG tablet Take 5 mg by mouth daily. 11/14/13  Yes Historical Provider, MD  benazepril (LOTENSIN) 20 MG tablet Take 20 mg by mouth daily. 11/14/13  Yes Historical Provider, MD  Fingolimod HCl (GILENYA) 0.5 MG CAPS Take 1 capsule (0.5 mg total) by mouth daily. 01/28/14  Yes York Spaniel, MD  fluconazole (DIFLUCAN) 150 MG tablet Take 1 tablet (150 mg total) by mouth once. 05/10/15  Yes Marny Lowenstein, PA-C  metroNIDAZOLE (FLAGYL) 500 MG tablet Take 1 tablet (500 mg total) by mouth 2 (two) times daily. 05/11/15  Yes Marny Lowenstein, PA-C  predniSONE (DELTASONE) 10 MG tablet Begin taking 6 tablets daily, taper by one tablet every other day until off the medication. 11/25/14  Yes York Spaniel, MD  tolterodine (DETROL) 2 MG tablet Take 2 mg by mouth 2 (two) times daily.   Yes Historical Provider, MD  topiramate (TOPAMAX) 25 MG tablet TAKE 1 TABLET BY MOUTH  AT NIGHT FOR 7 DAYS, 2 TABLETS AT NIGHT FOR 7DAY, 3 TABLETS NIGHTLY 02/19/15  Yes York Spaniel, MD  traZODone (DESYREL) 50 MG tablet TAKE 1 TABLET(50 MG) BY MOUTH AT BEDTIME AS NEEDED FOR SLEEP 02/16/15  Yes York Spaniel, MD    ROS:  Out of a complete 14 system review of symptoms, the patient complains only of the following symptoms, and all other reviewed systems are negative.  Insomnia Fatigue Bladder control problems  Blood pressure 132/94, pulse 70, height  (1.626 m), weight 193 lb (87.544 kg).  Physical Exam  General: The patient is alert and cooperative at the time of the examination.  Skin:  No significant peripheral edema is noted.   Neurologic Exam  Mental status: The patient is alert and oriented x 3 at the time of the examination. The patient has apparent normal recent and remote memory, with an apparently normal attention span and concentration ability.   Cranial nerves: Facial symmetry is present. Speech is normal, no aphasia or dysarthria is noted. Extraocular movements are full. Visual fields are full. Pupils are equal, round, and reactive to light. Discs are flat bilaterally.  Motor: The patient has good strength in all 4 extremities.  Sensory examination: Soft touch sensation is symmetric on the face, arms, and legs.  Coordination: The patient has good finger-nose-finger and heel-to-shin bilaterally.  Gait and station: The patient has a normal gait. Tandem gait is minimally unsteady. Romberg is negative. No drift is seen.  Reflexes: Deep tendon reflexes are symmetric.   MRI cervical 12/04/14:  IMPRESSION:  Abnormal MRI cervical spine (with and without) demonstrating: 1. Right C2 antero-lateral and left C3 postero-lateral chronic demyelinating plaques.  2. No abnormal enhancing lesions.   MRI brain 12/04/14:  IMPRESSION:  Abnormal MRI brain (with and without) demonstrating: 1. Mild-moderate, scattered periventricular and subcortical and juxtacortical, round and ovoid, foci of T2 hyperintensities. Some of these are hypointense on T1 views. No abnormal lesions are seen on post contrast views. Findings can be consistent with patient's diagnosis of chronic demyelinating disease. 2. No acute findings.  Assessment/Plan:  1. Multiple sclerosis  2. Chronic daily headache  3. Chronic insomnia  4. Chronic fatigue  5. Neurogenic bladder  The patient will be increased on the trazodone taking 100 mg night. The headaches continue, the Topamax will be increased to 100 mg at night for 2 weeks, then go to 150 mg at night. The 50 mg Topamax tablets were called  in. The patient will continue on her Gilenya, blood work will be done today. She will follow-up in 6 months, sooner if needed.  Marlan Palau MD 05/27/2015 8:54 PM  [facility] [address] [place], [place] [address]  Phone [phone] Fax [phone]

## 2015-05-28 LAB — CBC WITH DIFFERENTIAL/PLATELET
Basophils Absolute: 0 10*3/uL (ref 0.0–0.2)
Basos: 0 %
EOS (ABSOLUTE): 0 10*3/uL (ref 0.0–0.4)
Eos: 2 %
Hematocrit: 37.5 % (ref 34.0–46.6)
Hemoglobin: 12.4 g/dL (ref 11.1–15.9)
IMMATURE GRANULOCYTES: 0 %
Immature Grans (Abs): 0 10*3/uL (ref 0.0–0.1)
LYMPHS: 17 %
Lymphocytes Absolute: 0.3 10*3/uL — ABNORMAL LOW (ref 0.7–3.1)
MCH: 30.5 pg (ref 26.6–33.0)
MCHC: 33.1 g/dL (ref 31.5–35.7)
MCV: 92 fL (ref 79–97)
MONOS ABS: 0.3 10*3/uL (ref 0.1–0.9)
Monocytes: 14 %
NEUTROS PCT: 67 %
Neutrophils Absolute: 1.4 10*3/uL (ref 1.4–7.0)
PLATELETS: 206 10*3/uL (ref 150–379)
RBC: 4.06 x10E6/uL (ref 3.77–5.28)
RDW: 13.9 % (ref 12.3–15.4)
WBC: 2.1 10*3/uL — CL (ref 3.4–10.8)

## 2015-05-28 LAB — COMPREHENSIVE METABOLIC PANEL
A/G RATIO: 2.1 (ref 1.1–2.5)
ALT: 36 IU/L — AB (ref 0–32)
AST: 27 IU/L (ref 0–40)
Albumin: 4.4 g/dL (ref 3.5–5.5)
Alkaline Phosphatase: 53 IU/L (ref 39–117)
BUN/Creatinine Ratio: 15 (ref 9–23)
BUN: 16 mg/dL (ref 6–24)
Bilirubin Total: 0.2 mg/dL (ref 0.0–1.2)
CALCIUM: 9.1 mg/dL (ref 8.7–10.2)
CO2: 23 mmol/L (ref 18–29)
CREATININE: 1.08 mg/dL — AB (ref 0.57–1.00)
Chloride: 104 mmol/L (ref 97–106)
GFR calc Af Amer: 71 mL/min/{1.73_m2} (ref >59)
GFR, EST NON AFRICAN AMERICAN: 62 mL/min/{1.73_m2} (ref >59)
Globulin, Total: 2.1 g/dL (ref 1.5–4.5)
Glucose: 87 mg/dL (ref 65–99)
POTASSIUM: 3.9 mmol/L (ref 3.5–5.2)
Sodium: 141 mmol/L (ref 136–144)
Total Protein: 6.5 g/dL (ref 6.0–8.5)

## 2015-05-31 ENCOUNTER — Telehealth: Payer: Self-pay

## 2015-05-31 NOTE — Telephone Encounter (Signed)
I called the patient and relayed results. 

## 2015-05-31 NOTE — Telephone Encounter (Signed)
-----   Message from York Spaniel, MD sent at 05/28/2015  7:34 AM EDT ----- Blood work is relatively unremarkable, minimal non-significant elevation of SGPT liver enzyme, WBC is low as expected on Gilenya, absolute lymphocyte count is ok. We will recheck in 3 months. Please call the patient.Marland Kitchen ----- Message -----    From: [facility] Lab Results In Interface    Sent: 05/28/2015   5:41 AM      To: York Spaniel, MD

## 2015-06-01 ENCOUNTER — Encounter: Payer: Self-pay | Admitting: *Deleted

## 2015-06-03 ENCOUNTER — Ambulatory Visit (INDEPENDENT_AMBULATORY_CARE_PROVIDER_SITE_OTHER): Payer: Medicare Other | Admitting: Neurology

## 2015-06-03 ENCOUNTER — Encounter: Payer: Self-pay | Admitting: Neurology

## 2015-06-03 VITALS — BP 111/76 | HR 75 | Ht 64.0 in | Wt 197.0 lb

## 2015-06-03 DIAGNOSIS — R51 Headache: Secondary | ICD-10-CM | POA: Diagnosis not present

## 2015-06-03 DIAGNOSIS — R400 Somnolence: Secondary | ICD-10-CM

## 2015-06-03 DIAGNOSIS — E669 Obesity, unspecified: Secondary | ICD-10-CM

## 2015-06-03 DIAGNOSIS — G4710 Hypersomnia, unspecified: Secondary | ICD-10-CM | POA: Diagnosis not present

## 2015-06-03 DIAGNOSIS — R519 Headache, unspecified: Secondary | ICD-10-CM

## 2015-06-03 DIAGNOSIS — G43711 Chronic migraine without aura, intractable, with status migrainosus: Secondary | ICD-10-CM | POA: Diagnosis not present

## 2015-06-03 NOTE — Patient Instructions (Addendum)
Overall you are doing fairly well but I do want to suggest a few things today:   Remember to drink plenty of fluid, eat healthy meals and do not skip any meals. Try to eat protein with a every meal and eat a healthy snack such as fruit or nuts in between meals. Try to keep a regular sleep-wake schedule and try to exercise daily, particularly in the form of walking, 20-30 minutes a day, if you can.   As far as your medications are concerned, I would like to suggest: Botox, Start with Amitriptyline 25mg  and can increase to 50mg  qhs as tolerated  As far as diagnostic testing: EKG, sleep study evaluation  I would like to see you back for Botox, sooner if we need to. Please call us with any interim questions, concerns, problems, updates or refill requests.   Please also call us for any test results so we can go over those with you on the phone.  My clinical assistant and will answer any of your questions and relay your messages to me and also relay most of my messages to you.   Our phone number is [phone]. We also have an after hours call service for urgent matters and there is a physician on-call for urgent questions. For any emergencies you know to call 911 or go to the nearest emergency room

## 2015-06-03 NOTE — Progress Notes (Addendum)
[facility]    Provider:  Dr Lucia Gaskins Referring Provider: Elvera Lennox, PA-C Primary Care Physician:  Delphia Grates  CC:  Headaches  HPI:  Connie Robertson is a 44 y.o. female here as a referral from Dr. Janace Litten for headaches. She has had daily migraines for for 15 years. She has them every day. She is treated for her MS by Dr. Anne Hahn and is on Gilenya. She has been on so much medication and nothing has helped for the headaches. She does have photophobia and phonophobia with the headache, nausea without vomiting. She has daily migraines that last all day long. Throbbing, pulsating, pressure. Unilateral or all over.no aura.  The headaches are bitemporal, throbbing, light sensitivity and has to go into a dark room, sound exacerbates, she has nausea without vomiting. She is on Topamax right now which is not helping the migraines. She is on Trazodone. She has been to multiple neurologists for her headaches and been on multiple medications. Headaches are daily and last all day, continuous. She wakes with headaches. She is excessively tired and fatigued during the day. She gets vertigo and dizziness. The headaches can be 10/10. She has tried steroids. She works out daily and tries to stay active. She struggles getting through a spin class. She has been on nebivolol, trazodone, amitriptyline and currently topamax. Doesn't remember all the other medications she has tried. She doesn't sleep well. She has a difficult time initiating sleep. Doesn't know if she snores. She does not take OTC medications.   Reviewed notes, labs and imaging from outside physicians, which showed: Patient is treated by multiple sclerosis by Dr. Anne Hahn the patient is on Gilenya. The patient takes trazodone 50 mg at night for sleep does not completely effective.   Review of Systems: Patient complains of symptoms per HPI as well as the following symptoms; No CP, no SOB. Pertinent negatives per HPI. All others  negative.   Social History   Social History  . Marital Status: Divorced    Spouse Name: N/A  . Number of Children: 2  . Years of Education: college   Occupational History  . disability    Social History Main Topics  . Smoking status: Never Smoker   . Smokeless tobacco: Never Used  . Alcohol Use: 1.2 oz/week    2 Standard drinks or equivalent per week  . Drug Use: No  . Sexual Activity: Not Currently    Birth Control/ Protection: Surgical   Other Topics Concern  . Not on file   Social History Narrative   Patient is right handed.   Patient drinks 2 cups of coffee daily.    Family History  Problem Relation Age of Onset  . Heart failure Father   . Multiple sclerosis Sister   . Cancer Neg Hx   . Diabetes Neg Hx   . Hyperlipidemia Neg Hx   . Hypertension Neg Hx   . Kidney disease Neg Hx   . Stroke Neg Hx     Past Medical History  Diagnosis Date  . MS (multiple sclerosis) (HCC) 1992    Dr. Sandria Manly  . Depression   . Chronic daily headache 2003    [facility]  . Multiple sclerosis (HCC) 11/21/2013  . Neurogenic bladder 11/21/2013  . Abnormality of gait 11/25/2014    Past Surgical History  Procedure Laterality Date  . Tubal ligation    . Ablation colpoclesis  2004  . Cesarean section      Current Outpatient Prescriptions  Medication Sig Dispense  Refill  . acyclovir (ZOVIRAX) 400 MG tablet Take 1 tablet (400 mg total) by mouth 1 day or 1 dose. 90 tablet 0  . amLODipine (NORVASC) 5 MG tablet Take 5 mg by mouth daily.    . benazepril (LOTENSIN) 20 MG tablet Take 20 mg by mouth daily.    . Fingolimod HCl (GILENYA) 0.5 MG CAPS Take 1 capsule (0.5 mg total) by mouth daily. 30 capsule 6  . predniSONE (DELTASONE) 10 MG tablet Begin taking 6 tablets daily, taper by one tablet every other day until off the medication. 42 tablet 0  . tolterodine (DETROL) 2 MG tablet Take 2 mg by mouth 2 (two) times daily.    Marland Kitchen topiramate (TOPAMAX) 50 MG tablet Take 3 tablets (150 mg total) by  mouth at bedtime. 90 tablet 5  . traZODone (DESYREL) 100 MG tablet Take 1 tablet (100 mg total) by mouth at bedtime. 30 tablet 5  . [DISCONTINUED] nebivolol (BYSTOLIC) 10 MG tablet Take 1 tablet (10 mg total) by mouth daily. 90 tablet 0   No current facility-administered medications for this visit.    Allergies as of 06/03/2015  . (No Known Allergies)    Vitals: BP 111/76 mmHg  Pulse 75  Ht  (1.626 m)  Wt 197 lb (89.359 kg)  BMI 33.80 kg/m2 Last Weight:  Wt Readings from Last 1 Encounters:  06/03/15 197 lb (89.359 kg)   Last Height:   Ht Readings from Last 1 Encounters:  06/03/15  (1.626 m)    Physical exam: Exam: Gen: NAD, conversant, well nourised, obese, well groomed                     Eyes: Conjunctivae clear without exudates or hemorrhage  Neuro: Detailed Neurologic Exam  Speech:    Speech is normal; fluent and spontaneous with normal comprehension.  Cognition:    The patient is oriented to person, place, and time;  Cranial Nerves:    The pupils are equal, round, and reactive to light. The fundi are flat. Visual fields are full to finger confrontation. Extraocular movements are intact. Trigeminal sensation is intact and the muscles of mastication are normal. The face is symmetric. The palate elevates in the midline. Hearing intact. Voice is normal. Shoulder shrug is normal. The tongue has normal motion without fasciculations.    Assessment/Plan:  44 year old female with a pmhx of MS and chronic migraine disorder without aura and with status migrainosus, intractable.  Botox for migraine - will request Sleep study evaluation for OSA ve hypoventilation obesity syndrome- scheduled Patient has been on  Amitriptyline in the past and did not have side effects. Will start a little higher than usual  and then go to  qhs as tolerated.  She also has refractory insomnia so this may help with her insomnia.  Medrol dosepak for her current migraine, she is  here with sunglasses on and sitting in the dark. She went to the ED.  Will give her a shot of toradol today for her headaches. qtc 423 in the office. ekg with nsr, normal ekg. Discussed side effects amitriptyline including teratogenicity, do not Pregnant on this medication and use birth control. Serious side effects can include hypotension, hypertension, syncope, ventricular arrhythmias, QT prolongation and other cardiac side effects, stroke and seizures, ataxia tardive dyskinesias, extrapyramidal symptoms, increased intraocular pressure, leukopenia, thrombocytopenia, hallucinations, suicidality and other serious side effects. Common reactions include drowsiness, dry mouth, dizziness, constipation, blurred vision, palpitations, tachycardia, impaired coordination, increased  appetite, nausea vomiting, weakness, confusion, disorientation, restlessness, anxiety and other side effects.     Connie Dean, MD  [facility] [address] [place], [place] [address]  Phone [phone] Fax [phone]  A total of 30 minutes was spent face-to-face with this patient. Over half this time was spent on counseling patient on the migraine diagnosis and different diagnostic and therapeutic options available.

## 2015-06-04 ENCOUNTER — Telehealth: Payer: Self-pay | Admitting: Neurology

## 2015-06-04 NOTE — Telephone Encounter (Signed)
Patient is in the lobby requesting help for migraines before her apt for sleep study in 3 weeks.. Best call back is [phone].

## 2015-06-07 ENCOUNTER — Ambulatory Visit (INDEPENDENT_AMBULATORY_CARE_PROVIDER_SITE_OTHER): Payer: Medicare Other | Admitting: Neurology

## 2015-06-07 ENCOUNTER — Telehealth: Payer: Self-pay | Admitting: Neurology

## 2015-06-07 ENCOUNTER — Encounter: Payer: Self-pay | Admitting: Neurology

## 2015-06-07 VITALS — BP 125/80 | HR 67 | Ht 64.0 in | Wt 197.0 lb

## 2015-06-07 DIAGNOSIS — G43011 Migraine without aura, intractable, with status migrainosus: Secondary | ICD-10-CM | POA: Diagnosis not present

## 2015-06-07 DIAGNOSIS — R519 Headache, unspecified: Secondary | ICD-10-CM

## 2015-06-07 DIAGNOSIS — R51 Headache: Secondary | ICD-10-CM

## 2015-06-07 MED ORDER — METHYLPREDNISOLONE 4 MG PO TBPK: ORAL_TABLET | ORAL | Status: DC

## 2015-06-07 MED ORDER — KETOROLAC TROMETHAMINE 60 MG/2ML IM SOLN
60.0000 mg | Freq: Once | INTRAMUSCULAR | Status: AC
  Administered 2015-06-07: 60 mg via INTRAMUSCULAR

## 2015-06-07 MED ORDER — AMITRIPTYLINE HCL 25 MG PO TABS: 50.0000 mg | ORAL_TABLET | Freq: Every day | ORAL | Status: DC

## 2015-06-07 NOTE — Telephone Encounter (Signed)
Thanks

## 2015-06-07 NOTE — Patient Instructions (Signed)
Overall you are doing fairly well but I do want to suggest a few things today:   Remember to drink plenty of fluid, eat healthy meals and do not skip any meals. Try to eat protein with a every meal and eat a healthy snack such as fruit or nuts in between meals. Try to keep a regular sleep-wake schedule and try to exercise daily, particularly in the form of walking, 20-30 minutes a day, if you can.   As far as your medications are concerned, I would like to suggest: Botox, Start with Amitriptyline 25mg  and can increase to 50mg  qhs as tolerated, Medrol dosepak  As far as diagnostic testing: sleep study evaluation  I would like to see you back for Botox, sooner if we need to. Please call us with any interim questions, concerns, problems, updates or refill requests.   Please also call us for any test results so we can go over those with you on the phone.  My clinical assistant and will answer any of your questions and relay your messages to me and also relay most of my messages to you.

## 2015-06-07 NOTE — Telephone Encounter (Signed)
noted/fim 

## 2015-06-07 NOTE — Progress Notes (Signed)
 /67ml toradol IM given in right deltoid. Cleaned with alcohol wipe before injection. Pt tolerated well and stable upon leaving office.

## 2015-06-07 NOTE — Telephone Encounter (Signed)
Patient is being seen today thanks

## 2015-06-07 NOTE — Telephone Encounter (Signed)
Met with the patient after her consultation. Explained to her that she may have to receive her injections at [facility]. Sent information to [place] to start process.

## 2015-06-07 NOTE — Telephone Encounter (Signed)
I spoke to pt.  She has not had any effect from the toradol injection.  She will pick up the steroid dose pack and start this and let us know how she does.  She appreciated the call back.

## 2015-06-07 NOTE — Telephone Encounter (Signed)
Pt called sts she got injection today for migraine. She inquiring how long does it take for medication to start working. Please call and advise at [phone].

## 2015-06-08 NOTE — Telephone Encounter (Signed)
Pt called sts toradol injection did not work. She has not tried amitriptyline  nervous about the side effect being seizures.Please call and advise.

## 2015-06-08 NOTE — Telephone Encounter (Signed)
Seizures is an uncommon side effect and I am aware that patient has a history of seizures. If she has a history of seizures, we can try something else. Otherwise she should go ahead with the amitriptyline. Please remind her that it will take 3-4 weeks to start working. We are in the process of approving her botox (Dr. Anne Hahn will be doing the procedure). thanks

## 2015-06-08 NOTE — Telephone Encounter (Signed)
Called pt back and relayed Dr. Lucia Gaskins message. She is going to start amitriptyline tonight. Advised it can take 3-4 weeks to start working. She does not have history of seizures. She is going to start steroid pack tomorrow. She has not started that yet.   She wanted to ask Dr. Lucia Gaskins about Ambien. She was talking to her friend and that friends daughter took this to help with her headaches. She wanted to know if this was an option. I told her I will ask Dr Lucia Gaskins. She verbalized understanding.

## 2015-06-08 NOTE — Progress Notes (Signed)
[facility]    Provider:  Dr Lucia Gaskins Referring Provider: Elvera Lennox, PA-C Primary Care Physician:  Delphia Grates  CC: Headaches   HPI: Connie Robertson is a 44 y.o. female here as a referral from Dr. Janace Litten for headaches. She has had daily migraines for for 15 years. She has them every day. She is treated for her MS by Dr. Anne Hahn and is on Gilenya. She has been on so much medication and nothing has helped for the headaches. She does have photophobia and phonophobia with the headache, nausea without vomiting. She has daily migraines that last all day long. Throbbing, pulsating, pressure. Unilateral or all over.no aura. The headaches are bitemporal, throbbing, light sensitivity and has to go into a dark room, sound exacerbates, she has nausea without vomiting. She is on Topamax right now which is not helping the migraines. She is on Trazodone. She has been to multiple neurologists for her headaches and been on multiple medications. Headaches are daily and last all day, continuous. She wakes with headaches. She is excessively tired and fatigued during the day. She gets vertigo and dizziness. The headaches can be 10/10. She has tried steroids. She works out daily and tries to stay active. She struggles getting through a spin class. She has been on nebivolol, trazodone, amitriptyline and currently topamax. Doesn't remember all the other medications she has tried. She doesn't sleep well. She has a difficult time initiating sleep. Doesn't know if she snores. She does not take OTC medications.   Reviewed notes, labs and imaging from outside physicians, which showed: Patient is treated by multiple sclerosis by Dr. Anne Hahn the patient is on Gilenya. The patient takes trazodone 50 mg at night for sleep does not completely effective.   Review of Systems: Patient complains of symptoms per HPI as well as the following symptoms; No CP, no SOB. Pertinent negatives per HPI. All others  negative.  Social History   Social History  . Marital Status: Divorced    Spouse Name: N/A  . Number of Children: 2  . Years of Education: college   Occupational History  . disability    Social History Main Topics  . Smoking status: Never Smoker   . Smokeless tobacco: Never Used  . Alcohol Use: 1.2 oz/week    2 Standard drinks or equivalent per week  . Drug Use: No  . Sexual Activity: Not Currently    Birth Control/ Protection: Surgical   Other Topics Concern  . Not on file   Social History Narrative   Patient is right handed.   Patient drinks 2 cups of coffee daily.    Family History  Problem Relation Age of Onset  . Heart failure Father   . Multiple sclerosis Sister   . Cancer Neg Hx   . Diabetes Neg Hx   . Hyperlipidemia Neg Hx   . Hypertension Neg Hx   . Kidney disease Neg Hx   . Stroke Neg Hx     Past Medical History  Diagnosis Date  . MS (multiple sclerosis) (HCC) 1992    Dr. Sandria Manly  . Depression   . Chronic daily headache 2003    Love  . Multiple sclerosis (HCC) 11/21/2013  . Neurogenic bladder 11/21/2013  . Abnormality of gait 11/25/2014    Past Surgical History  Procedure Laterality Date  . Tubal ligation    . Ablation colpoclesis  2004  . Cesarean section      Current Outpatient Prescriptions  Medication Sig Dispense Refill  .  acyclovir (ZOVIRAX) 400 MG tablet Take 1 tablet (400 mg total) by mouth 1 day or 1 dose. 90 tablet 0  . amLODipine (NORVASC) 5 MG tablet Take 5 mg by mouth daily.    . benazepril (LOTENSIN) 20 MG tablet Take 20 mg by mouth daily.    . Fingolimod HCl (GILENYA) 0.5 MG CAPS Take 1 capsule (0.5 mg total) by mouth daily. 30 capsule 6  . predniSONE (DELTASONE) 10 MG tablet Begin taking 6 tablets daily, taper by one tablet every other day until off the medication. 42 tablet 0  . tolterodine (DETROL) 2 MG tablet Take 2 mg by mouth 2 (two) times daily.    Marland Kitchen topiramate (TOPAMAX) 50 MG tablet Take 3 tablets (150 mg total) by mouth  at bedtime. 90 tablet 5  . traZODone (DESYREL) 100 MG tablet Take 1 tablet (100 mg total) by mouth at bedtime. 30 tablet 5  . amitriptyline (ELAVIL) 25 MG tablet Take 2 tablets (50 mg total) by mouth at bedtime. 60 tablet 6  . methylPREDNISolone (MEDROL DOSEPAK) 4 MG TBPK tablet follow package directions 21 tablet 0  . [DISCONTINUED] nebivolol (BYSTOLIC) 10 MG tablet Take 1 tablet (10 mg total) by mouth daily. 90 tablet 0   No current facility-administered medications for this visit.    Allergies as of 06/07/2015  . (No Known Allergies)    Vitals: BP 125/80 mmHg  Pulse 67  Ht  (1.626 m)  Wt 197 lb (89.359 kg)  BMI 33.80 kg/m2 Last Weight:  Wt Readings from Last 1 Encounters:  06/07/15 197 lb (89.359 kg)   Last Height:   Ht Readings from Last 1 Encounters:  06/07/15  (1.626 m)    Physical exam: Exam: Gen: NAD, conversant, well nourised, obese, well groomed  Eyes: Conjunctivae clear without exudates or hemorrhage  Neuro: Detailed Neurologic Exam  Speech:  Speech is normal; fluent and spontaneous with normal comprehension.  Cognition:  The patient is oriented to person, place, and time;  Cranial Nerves:  The pupils are equal, round, and reactive to light. The fundi are flat. Visual fields are full to finger confrontation. Extraocular movements are intact. Trigeminal sensation is intact and the muscles of mastication are normal. The face is symmetric. The palate elevates in the midline. Hearing intact. Voice is normal. Shoulder shrug is normal. The tongue has normal motion without fasciculations.    Assessment/Plan: 44 year old female with a pmhx of MS and chronic migraine disorder without aura and with status migrainosus, intractable.  Botox for migraine - will request Sleep study evaluation for OSA ve hypoventilation obesity syndrome- scheduled Patient has been on  Amitriptyline in the past and did not have side effects. Will  start a little higher than usual  and then go to  qhs as tolerated. She also has refractory insomnia so this may help with her insomnia.  Medrol dosepak for her current migraine, she is here with sunglasses on and sitting in the dark. She went to the ED.  Will give her a shot of toradol today for her headaches. qtc 423 in the office. ekg with nsr, normal ekg. Discussed side effects amitriptyline including teratogenicity, do not Pregnant on this medication and use birth control. Serious side effects can include hypotension, hypertension, syncope, ventricular arrhythmias, QT prolongation and other cardiac side effects, stroke and seizures, ataxia tardive dyskinesias, extrapyramidal symptoms, increased intraocular pressure, leukopenia, thrombocytopenia, hallucinations, suicidality and other serious side effects. Common reactions include drowsiness, dry mouth, dizziness, constipation, blurred vision,  palpitations, tachycardia, impaired coordination, increased appetite, nausea vomiting, weakness, confusion, disorientation, restlessness, anxiety and other side effects.   Naomie Dean, MD  [facility] [address] [place], [place] [address]  Phone [phone] Fax [phone]  A total of 15 minutes was spent face-to-face with this patient. Over half this time was spent on counseling patient on the status migrainosus diagnosis and different diagnostic and therapeutic options available.

## 2015-06-08 NOTE — Telephone Encounter (Signed)
Ambien is not a medication that I use for headaches, thanks

## 2015-06-09 NOTE — Telephone Encounter (Signed)
Dr. Anne Hahn -  I am going to refer this patient back to you as discussed, thanks. She called today for fatigue. Thanks, Sheralyn Boatman

## 2015-06-09 NOTE — Telephone Encounter (Signed)
I called the patient. The patient is reporting fatigue, but she is not sleeping well at night. Amitriptyline was recently added, I'll have her stop the trazodone well on amitriptyline. The patient may add Benadryl at night for for sleep, getting her to sleep this key for helping some of the fatigue. If this does not seem to help over the next week to 10 days, the patient will contact our office, we will consider adding tizanidine or gabapentin for the headache and leg stiffness that she reports.

## 2015-06-09 NOTE — Telephone Encounter (Signed)
Called pt back and relayed Dr Lucia Gaskins message that she does not prescribe ambiem for headaches. She verbalized understanding.

## 2015-06-09 NOTE — Telephone Encounter (Addendum)
Pt called and states she feels very tired all the time and doesn't know if its because of the migraines or the MS or both. States she attends spin class reguarly but is so tired all the time that she is having a hard time going now. Please call and advise [phone]

## 2015-06-09 NOTE — Addendum Note (Signed)
Addended by: Stephanie Acre on: 06/09/2015 05:19 PM   Modules accepted: Orders, Medications

## 2015-06-16 ENCOUNTER — Telehealth: Payer: Self-pay | Admitting: *Deleted

## 2015-06-16 MED ORDER — FINGOLIMOD HCL 0.5 MG PO CAPS: 0.5000 mg | ORAL_CAPSULE | Freq: Every day | ORAL | Status: DC

## 2015-06-16 NOTE — Telephone Encounter (Signed)
Patient is calling back. She did call the Gilenya Go program and she would like to discuss what they told the patient. Thank you.

## 2015-06-16 NOTE — Telephone Encounter (Signed)
I called back, got no answer.  Left message.  (If the program needs a new Rx, we will be happy to contact them.)

## 2015-06-16 NOTE — Telephone Encounter (Signed)
Prescription has been sent to [facility], receipt confirmed by pharmacy.  I called the patient back to advise.  She is aware.  As well, provided her with the number for Gilenya [facility], [phone].  In most cases, they are able to send temp supply of meds to bridge patients if needed.  Patient is agreeable to contacting them, and will call us back if anything further is needed.

## 2015-06-17 ENCOUNTER — Telehealth: Payer: Self-pay | Admitting: Neurology

## 2015-06-17 NOTE — Telephone Encounter (Signed)
LMTCAnne Hahn MS. pt.  last seen by him on 05-27-15.  Hgb 12.4 (wnl) and Hct 37.5 (wnl), vit. B12 470 (wnl) on 04-05-15.  No meds for fatigue noted in med list. Will check with pt. to see if she is having other sx., if she has tried other meds in the past for fatigue./fim

## 2015-06-17 NOTE — Telephone Encounter (Signed)
Patient called, "Connie Robertson can prescribe or suggest anything for energy that would help with tiredness, feels really really tired".

## 2015-06-18 ENCOUNTER — Encounter: Payer: Self-pay | Admitting: Neurology

## 2015-06-22 ENCOUNTER — Telehealth: Payer: Self-pay | Admitting: *Deleted

## 2015-06-22 NOTE — Telephone Encounter (Signed)
-----   Message from Anson Fret, MD sent at 06/22/2015  8:21 AM EST ----- Patient's EKG was normal thanks

## 2015-06-22 NOTE — Telephone Encounter (Signed)
Spoke w/ pt about normal EKG. Pt verbalized understanding.

## 2015-06-28 ENCOUNTER — Encounter: Payer: Self-pay | Admitting: Neurology

## 2015-06-28 ENCOUNTER — Ambulatory Visit (INDEPENDENT_AMBULATORY_CARE_PROVIDER_SITE_OTHER): Payer: Medicare Other | Admitting: Neurology

## 2015-06-28 ENCOUNTER — Telehealth: Payer: Self-pay

## 2015-06-28 VITALS — BP 118/82 | HR 82 | Resp 20 | Ht 64.0 in | Wt 195.0 lb

## 2015-06-28 DIAGNOSIS — H81399 Other peripheral vertigo, unspecified ear: Secondary | ICD-10-CM | POA: Diagnosis not present

## 2015-06-28 DIAGNOSIS — G35 Multiple sclerosis: Secondary | ICD-10-CM | POA: Diagnosis not present

## 2015-06-28 DIAGNOSIS — G4701 Insomnia due to medical condition: Secondary | ICD-10-CM | POA: Diagnosis not present

## 2015-06-28 DIAGNOSIS — G4761 Periodic limb movement disorder: Secondary | ICD-10-CM

## 2015-06-28 DIAGNOSIS — H8109 Meniere's disease, unspecified ear: Secondary | ICD-10-CM

## 2015-06-28 DIAGNOSIS — G43711 Chronic migraine without aura, intractable, with status migrainosus: Secondary | ICD-10-CM

## 2015-06-28 MED ORDER — ZALEPLON 10 MG PO CAPS: 10.0000 mg | ORAL_CAPSULE | Freq: Every evening | ORAL | Status: DC | PRN

## 2015-06-28 MED ORDER — FINGOLIMOD HCL 0.5 MG PO CAPS
0.5000 mg | ORAL_CAPSULE | Freq: Every day | ORAL | Status: DC
Start: 2015-06-28 — End: 2015-12-06

## 2015-06-28 NOTE — Telephone Encounter (Addendum)
Pt called and would like to know when she might be receiving her medication. She was told about the change in distributor. Please call and advise. [phone]. Pt states that it has been 12 days since she has taken her medication.

## 2015-06-28 NOTE — Progress Notes (Signed)
[facility]   Provider:  Melvyn Novas, M D  Referring Provider: Elvera Lennox, PA-C Primary Care Physician:  Delphia Grates  Chief Complaint  Patient presents with  . New Patient (Initial Visit)    been having headaches since her daugther was born 14 years ago, referral from Dr. Lucia Gaskins, rm 11, alone    HPI:  Connie Robertson is a 44 y.o. female , seen here as a referral  from Dr. Lucia Gaskins and Anne Hahn. Connie Robertson is a very young appearing African-American right-handed 71 year old mother of 2. She has been followed by Dr. Anne Hahn for the treatment of multiple sclerosis and takes the oral medication Gilenya. She was referred later again by her primary care office for headache treatment - at this time to Dr. Lucia Gaskins.  She describes photophobia and phonophobia with her headaches, nausea but no vomiting. These migrainous headaches last all day long they actually got worse during her pregnancy 15 years ago but her MS seems to have calmed during the pregnancy time. The quality of her headache is described as throbbing sometimes pulsating always with a sensation of pressure. They can be in both temples or unilateral. Her headaches are not precipitated by an aura. She likes to stay in a dark room and in a quiet environment. She feels queasy to her stomach. She has been trying topiramate as a preventative but it has not curbed the headaches frequency or intensity. She's also on trazodone to help her sleep. She doesn't sleep well and she has a difficult time initiating sleep. She's not sure if she snores but she wakes with headaches. Often, throughout the day, she's excessively tired and fatigued. Headaches have reached an intensity up to 10 out of 10. She works out daily and she is trying to stay active but she struggles getting through some of her exercise classes.  The trazodone has not shortened sleep latency she reports it may take hours before she can go to sleep even after 50 mg by  mouth.  Sleep habits are as follows: The patient describes her usual bedtime at about 10 PM but this doesn't mean that she falls asleep promptly. She may sleep finally at about midnight and this was a help of sleep aids. She takes the trazodone at about 9:30 PM. He often has a headache by the time she goes to bed. She states that her sleep is fragmented by 2-3 bathroom breaks at night. The first bathroom break will be within 2-3 hours after falling asleep. She tries to get up very slow and has often a sensation of vertigo the room spinning. A clockwise rotation. She does have headaches when she wakes up. The vertigo and the head hurting occur at the same time. There is a different quality to this headache versus those she has in the daytime. She will have a vertigo spells even when she bends down to tie her shoes for example. This has been present for a long time. When her children were smaller she could not help them  to tie the shoes unless she  would sit them up on a bar chair. In the morning when she wakes up she just hurts. She rises at 7 AM, only her children will wake her up. She has never experienced sharp stabbing ice pick like headaches waking her from sleep. Usually she wakes in the headaches are just there.   Sleep medical history and family sleep history: none known.    Social history: She drinks about 1-2 cups  of caffeine a to coffee a day, mostly in the morning. She does not drink soda but iced tea 2 times a week at lunch or dinner. ETOH rarely, no tobacco use.   Review of Systems: Out of a complete 14 system review, the patient complains of only the following symptoms, and all other reviewed systems are negative.   Epworth score 11 , Fatigue severity score 53 , depression score 2 . PHQ.   Social History   Social History  . Marital Status: Divorced    Spouse Name: N/A  . Number of Children: 2  . Years of Education: college   Occupational History  . disability     Social History Main Topics  . Smoking status: Never Smoker   . Smokeless tobacco: Never Used  . Alcohol Use: 1.2 oz/week    2 Standard drinks or equivalent per week  . Drug Use: No  . Sexual Activity: Not Currently    Birth Control/ Protection: Surgical   Other Topics Concern  . Not on file   Social History Narrative   Patient is right handed.   Patient drinks 2 cups of coffee daily.    Family History  Problem Relation Age of Onset  . Heart failure Father   . Multiple sclerosis Sister   . Cancer Neg Hx   . Diabetes Neg Hx   . Hyperlipidemia Neg Hx   . Hypertension Neg Hx   . Kidney disease Neg Hx   . Stroke Neg Hx     Past Medical History  Diagnosis Date  . MS (multiple sclerosis) (HCC) 1992    Dr. Sandria Manly  . Depression   . Chronic daily headache 2003    [facility]  . Multiple sclerosis (HCC) 11/21/2013  . Neurogenic bladder 11/21/2013  . Abnormality of gait 11/25/2014    Past Surgical History  Procedure Laterality Date  . Tubal ligation    . Ablation colpoclesis  2004  . Cesarean section      Current Outpatient Prescriptions  Medication Sig Dispense Refill  . acyclovir (ZOVIRAX) 400 MG tablet Take 1 tablet (400 mg total) by mouth 1 day or 1 dose. 90 tablet 0  . amitriptyline (ELAVIL) 25 MG tablet Take 2 tablets (50 mg total) by mouth at bedtime. 60 tablet 6  . amLODipine (NORVASC) 5 MG tablet Take 5 mg by mouth daily.    . benazepril (LOTENSIN) 20 MG tablet Take 20 mg by mouth daily.    . Fingolimod HCl (GILENYA) 0.5 MG CAPS Take 1 capsule (0.5 mg total) by mouth daily. 30 capsule 3  . tolterodine (DETROL) 2 MG tablet Take 2 mg by mouth 2 (two) times daily.    Marland Kitchen topiramate (TOPAMAX) 50 MG tablet Take 3 tablets (150 mg total) by mouth at bedtime. 90 tablet 5  . traZODone (DESYREL) 100 MG tablet   5  . [DISCONTINUED] nebivolol (BYSTOLIC) 10 MG tablet Take 1 tablet (10 mg total) by mouth daily. 90 tablet 0   No current facility-administered medications for this  visit.    Allergies as of 06/28/2015  . (No Known Allergies)    Vitals: BP 118/82 mmHg  Pulse 82  Resp 20  Ht 5\' 4"  (1.626 m)  Wt 195 lb (88.451 kg)  BMI 33.46 kg/m2 Last Weight:  Wt Readings from Last 1 Encounters:  06/28/15 195 lb (88.451 kg)   APO:LIDC mass index is 33.46 kg/(m^2).     Last Height:   Ht Readings from Last 1 Encounters:  06/28/15   (1.626 m)    Physical exam:  General: The patient is awake, alert and appears not in acute distress. The patient is well groomed. Head: Normocephalic, atraumatic. Neck is supple. Mallampati 1,  neck circumference:1q.75 . Nasal airflow unrestricted , TMJ is not evident . Retrognathia is seen.  Cardiovascular:  Regular rate and rhythm , without  murmurs or carotid bruit, and without distended neck veins. Respiratory: Lungs are clear to auscultation. Skin:  Without evidence of edema, or rash Trunk:  The patient's posture is erect.  Neurologic exam : The patient is awake and alert, oriented to place and time.   Memory subjective described as intact.  Attention span & concentration ability appears normal.  Speech is fluent,  without dysarthria, dysphonia or aphasia.  Mood and affect are appropriate.  Cranial nerves: Pupils are equal and briskly reactive to light.  Funduscopic exam without evidence of pallor or edema. Extraocular movements  in vertical and horizontal planes intact and without nystagmus. Visual fields by finger perimetry are intact. Hearing to finger rub intact.   Facial sensation intact to fine touch.  Facial motor strength is symmetric and tongue and uvula move midline. Shoulder shrug was symmetrical.   Motor exam:  Normal tone, muscle bulk and symmetric strength in all extremities.  Sensory:  Fine touch, pinprick and vibration were normal.  Coordination: Rapid alternating movements in the fingers/hands was normal.  Finger-to-nose maneuver normal without evidence of ataxia, dysmetria or tremor.  Gait  and station: Patient walks without assistive device . Deep tendon reflexes: in the  upper and lower extremities are symmetric and intact. Babinski maneuver response is downgoing.  The patient was advised of the nature of the diagnosed sleep disorder , the treatment options and risks for general a health and wellness arising from not treating the condition.  I spent more than  40 minutes of face to face time with the patient. Greater than 50% of time was spent in counseling and coordination of care. We have discussed the diagnosis and differential and I answered the patient's questions.    IMPRESSION:  Abnormal MRI brain (with and without) demonstrating: 1. Mild-moderate, scattered periventricular and subcortical and juxtacortical, round and ovoid, foci of T2 hyperintensities. Some of these are hypointense on T1 views. No abnormal lesions are seen on post contrast views. Findings can be consistent with patient's diagnosis of chronic demyelinating disease. 2. No acute findings.    INTERPRETING PHYSICIAN:  Suanne Marker, MD Certified in Neurology, Neurophysiology and Neuroimaging   Assessment:  After physical and neurologic examination, review of laboratory studies,  Personal review of imaging studies from April 2016 - MRI brain and c spine. , reports of other /same  Imaging studies , neurophysiology testing and pre-existing records as far as provided in visit., my assessment is   1) Connie Robertson suffers from insomnia for over a decade. She has tried multiple sleep aids. She feels that her sleep problems have followed her headaches which were there first. Her headaches are daily migrainous headaches without a preference of side. She has once tried marijuana and was headache free for 3 days. A second use of the same product did not give the relief that she had hoped for.  2) the patient describes vertigo which may be related to her multiple sclerosis she describes the room spinning  around her in a clockwise rotation. This is present at night when she wakes up and seems to occur similar times with the headaches. One strong trigger for  her vertigo is bending down.  3) she has no history or report of cluster headaches,SUNCT . She does not describe hemicrania continua.  4) MS was diagnosed at age 44, 25 years ago   Plan:  Treatment plan and additional workup :  The patient has never been told that she snores, she wakes up with headaches that she went to bed with. The headaches are not triggered by sleep nor does interrupt her sleep. She feels better when she sleeps. She has never been evaluated for organic reasons for her insomnia, which by now is considered chronic. Multiple sclerosis may be the single reason for her high fatigue degree.  Vestibular rehab to confirm central versus labyrinthine vertigo.   I will order a sleep study to rule out organic reasons some she may not be aware of such as hypoxemia at night, I also would like to evaluate for periodic limb movements which can be triggered by MS lesions in the spine.   Porfirio Mylar Tremane Spurgeon MD  06/28/2015   Rv after sleep study.  PSG with special attention to PLMs and to oxygen saturation.   CC: Elvera Lennox, Pa-c [address] [place], [place] [address]

## 2015-06-28 NOTE — Patient Instructions (Signed)
Insomnia Insomnia is a sleep disorder that makes it difficult to fall asleep or to stay asleep. Insomnia can cause tiredness (fatigue), low energy, difficulty concentrating, mood swings, and poor performance at work or school.  There are three different ways to classify insomnia:  Difficulty falling asleep.  Difficulty staying asleep.  Waking up too early in the morning. Any type of insomnia can be long-term (chronic) or short-term (acute). Both are common. Short-term insomnia usually lasts for three months or less. Chronic insomnia occurs at least three times a week for longer than three months. CAUSES  Insomnia may be caused by another condition, situation, or substance, such as:  Anxiety.  Certain medicines.  Gastroesophageal reflux disease (GERD) or other gastrointestinal conditions.  Asthma or other breathing conditions.  Restless legs syndrome, sleep apnea, or other sleep disorders.  Chronic pain.  Menopause. This may include hot flashes.  Stroke.  Abuse of alcohol, tobacco, or illegal drugs.  Depression.  Caffeine.   Neurological disorders, such as Alzheimer disease.  An overactive thyroid (hyperthyroidism). The cause of insomnia may not be known. RISK FACTORS Risk factors for insomnia include:  Gender. Women are more commonly affected than men.  Age. Insomnia is more common as you get older.  Stress. This may involve your professional or personal life.  Income. Insomnia is more common in people with lower income.  Lack of exercise.   Irregular work schedule or night shifts.  Traveling between different time zones. SIGNS AND SYMPTOMS If you have insomnia, trouble falling asleep or trouble staying asleep is the main symptom. This may lead to other symptoms, such as:  Feeling fatigued.  Feeling nervous about going to sleep.  Not feeling rested in the morning.  Having trouble concentrating.  Feeling irritable, anxious, or depressed. TREATMENT   Treatment for insomnia depends on the cause. If your insomnia is caused by an underlying condition, treatment will focus on addressing the condition. Treatment may also include:   Medicines to help you sleep.  Counseling or therapy.  Lifestyle adjustments. HOME CARE INSTRUCTIONS   Take medicines only as directed by your health care provider.  Keep regular sleeping and waking hours. Avoid naps.  Keep a sleep diary to help you and your health care provider figure out what could be causing your insomnia. Include:   When you sleep.  When you wake up during the night.  How well you sleep.   How rested you feel the next day.  Any side effects of medicines you are taking.  What you eat and drink.   Make your bedroom a comfortable place where it is easy to fall asleep:  Put up shades or special blackout curtains to block light from outside.  Use a white noise machine to block noise.  Keep the temperature cool.   Exercise regularly as directed by your health care provider. Avoid exercising right before bedtime.  Use relaxation techniques to manage stress. Ask your health care provider to suggest some techniques that may work well for you. These may include:  Breathing exercises.  Routines to release muscle tension.  Visualizing peaceful scenes.  Cut back on alcohol, caffeinated beverages, and cigarettes, especially close to bedtime. These can disrupt your sleep.  Do not overeat or eat spicy foods right before bedtime. This can lead to digestive discomfort that can make it hard for you to sleep.  Limit screen use before bedtime. This includes:  Watching TV.  Using your smartphone, tablet, and computer.  Stick to a routine. This   can help you fall asleep faster. Try to do a quiet activity, brush your teeth, and go to bed at the same time each night.  Get out of bed if you are still awake after 15 minutes of trying to sleep. Keep the lights down, but try reading or  doing a quiet activity. When you feel sleepy, go back to bed.  Make sure that you drive carefully. Avoid driving if you feel very sleepy.  Keep all follow-up appointments as directed by your health care provider. This is important. SEEK MEDICAL CARE IF:   You are tired throughout the day or have trouble in your daily routine due to sleepiness.  You continue to have sleep problems or your sleep problems get worse. SEEK IMMEDIATE MEDICAL CARE IF:   You have serious thoughts about hurting yourself or someone else.   This information is not intended to replace advice given to you by your health care provider. Make sure you discuss any questions you have with your health care provider.   Document Released: 07/21/2000 Document Revised: 04/14/2015 Document Reviewed: 04/24/2014 Elsevier Interactive Patient Education 2016 Elsevier Inc.  

## 2015-06-28 NOTE — Telephone Encounter (Signed)
Pt is here for her appt with Dr. Vickey Huger. She is requesting a refill on her Gilenya. She says she needs an RX from her Neurologist to get it. She uses [facility]. I see she has 3 refills left. I am not sure what the problem is, but I advised pt that I would let our pharmacy tech know. Pt verbalized understanding.

## 2015-06-28 NOTE — Telephone Encounter (Signed)
I called the pharmacy to clarify.  Was on hold for a very long time, over 25 minutes.  Sherronda answered the call, but  Before I could ask her anything, the call was disconnected.  I called back again.  Was on hold for about 20 minutes.  Spoke with Raquel.  She reviewed the patient file and said [facility] will no longer be the correct pharmacy for [facility], as the pharmacy benefit distributor has changed to [facility].  She asked that we resend the Rx to them.  Rx has been resent to [facility] per instruction.  Receipt confirmed by pharmacy.

## 2015-06-28 NOTE — Telephone Encounter (Signed)
I called [facility].  They stated it may take 72 hours to process request.    I called Gilenya Go.  Spoke with Rosalita Chessman, who was not able to assist and transferred me to [facility].  I provided verbal orders, and they will over night meds to the patient.  Says they will be delivered by end of business tomorrow.  I called back and spoke with the patient.  She expressed understanding and appreciation.  Says she has missed a couple of doses, but it has not been 12 days.  She will call us back if anything further is needed.

## 2015-06-29 ENCOUNTER — Ambulatory Visit (INDEPENDENT_AMBULATORY_CARE_PROVIDER_SITE_OTHER): Payer: Medicare Other | Admitting: Neurology

## 2015-06-29 DIAGNOSIS — G43711 Chronic migraine without aura, intractable, with status migrainosus: Secondary | ICD-10-CM

## 2015-06-29 DIAGNOSIS — G4710 Hypersomnia, unspecified: Secondary | ICD-10-CM | POA: Diagnosis not present

## 2015-06-29 DIAGNOSIS — G35 Multiple sclerosis: Secondary | ICD-10-CM

## 2015-06-29 DIAGNOSIS — H8109 Meniere's disease, unspecified ear: Secondary | ICD-10-CM

## 2015-06-29 DIAGNOSIS — G4761 Periodic limb movement disorder: Secondary | ICD-10-CM

## 2015-06-29 DIAGNOSIS — G4701 Insomnia due to medical condition: Secondary | ICD-10-CM

## 2015-06-30 NOTE — Sleep Study (Signed)
Please see the scanned sleep study interpretation located in the procedure tab in the chart view section.  

## 2015-07-14 ENCOUNTER — Telehealth: Payer: Self-pay

## 2015-07-14 NOTE — Telephone Encounter (Signed)
Called pt to discuss sleep study results. No answer, left message asking her to call me back.

## 2015-07-20 NOTE — Telephone Encounter (Signed)
Spoke to pt regarding her sleep study results. I advised her that Dr. Vickey Huger reviewed her study and it reveals no evidence for significant central or obstructive sleep apnea. Snoring was not noted and neither was insomnia. If insomnia is a concern for pt, she may consider a sleep psychology referral. Pt says she thinks she sleeps fine. I advised her that a follow up with Dr. Vickey Huger is not needed, but to continue following up with Dr. Lucia Gaskins and Dr. Anne Hahn. Pt verbalized understanding.  Pt asking what is the next step for her headaches now that her sleep study looks ok? She says that she is unsure of whether she is supposed to follow up with Dr. Lucia Gaskins or Dr. Anne Hahn. I found a telephone note form 06/07/15 that looks like Dr. Lucia Gaskins referred pt back to Dr. Anne Hahn but I am unsure if Dr. Lucia Gaskins is still following with pt for headaches. It also appears that pt was supposed to have her botox injections at [facility]. Pt states that the topamax she takes a bed time only minimally helps with her headaches.   Pt would like a call back explaining which doctor to follow up with. Pt has an appt with Aundra Millet, NP on 11/25/15.

## 2015-07-20 NOTE — Telephone Encounter (Signed)
Patient returned Kristen's call °

## 2015-07-20 NOTE — Telephone Encounter (Signed)
I called the patient. I advised that, per the notes, she will be following up with Dr. Anne Hahn. She asked about a time frame for her Botox. I advised that I would forward this to our Botox coordinator and ask her to give the patient a call regarding this.

## 2015-07-28 NOTE — Telephone Encounter (Signed)
Patient has been referred to [facility]. I called to inform her and gave her their information.

## 2015-08-05 ENCOUNTER — Encounter: Payer: Self-pay | Admitting: Physical Medicine & Rehabilitation

## 2015-08-05 ENCOUNTER — Encounter: Payer: Medicare Other | Attending: Physical Medicine & Rehabilitation | Admitting: Physical Medicine & Rehabilitation

## 2015-08-05 VITALS — BP 112/73 | HR 72

## 2015-08-05 DIAGNOSIS — N319 Neuromuscular dysfunction of bladder, unspecified: Secondary | ICD-10-CM | POA: Insufficient documentation

## 2015-08-05 DIAGNOSIS — R531 Weakness: Secondary | ICD-10-CM | POA: Insufficient documentation

## 2015-08-05 DIAGNOSIS — R269 Unspecified abnormalities of gait and mobility: Secondary | ICD-10-CM | POA: Insufficient documentation

## 2015-08-05 DIAGNOSIS — G43711 Chronic migraine without aura, intractable, with status migrainosus: Secondary | ICD-10-CM | POA: Diagnosis not present

## 2015-08-05 DIAGNOSIS — F329 Major depressive disorder, single episode, unspecified: Secondary | ICD-10-CM | POA: Insufficient documentation

## 2015-08-05 DIAGNOSIS — G35 Multiple sclerosis: Secondary | ICD-10-CM | POA: Insufficient documentation

## 2015-08-05 NOTE — Progress Notes (Addendum)
Subjective:    Patient ID: Connie Robertson, female    DOB: Aug 09, 1970, 44 y.o.   MRN: [id number]  HPI  44 y.o. female with hx of migraines presents for botox injections.  Pt's migraines occur daily for the last 15 years. She has associated photophobia and phonophobia with nausea, but no vomiting. She denies aura. They are throbbing and pulsating and feel like intense pressure. They can be unilateral or diffuse, located over her bitemporal area. They cause her fatigue and can be 10/10 in severity. She currently participates in spin class.  She has tried Topamax (which she is on currently), steroids, nebivolol, trazodone, amitriptyline (recently increased) without benefit.   Pain Inventory Average Pain 10 Pain Right Now 10 My pain is aching  In the last 24 hours, has pain interfered with the following? General activity 9 Relation with others 9 Enjoyment of life 9 What TIME of day is your pain at its worst? daytime Sleep (in general) Fair  Pain is worse with: NA Pain improves with: NA Relief from Meds: 4  Mobility walk without assistance  Function Do you have any goals in this area?  no  Neuro/Psych No problems in this area  Prior Studies Any changes since last visit?  no  Physicians involved in your care Any changes since last visit?  no   Family History  Problem Relation Age of Onset  . Heart failure Father   . Multiple sclerosis Sister   . Cancer Neg Hx   . Diabetes Neg Hx   . Hyperlipidemia Neg Hx   . Hypertension Neg Hx   . Kidney disease Neg Hx   . Stroke Neg Hx    Social History   Social History  . Marital Status: Divorced    Spouse Name: N/A  . Number of Children: 2  . Years of Education: college   Occupational History  . disability    Social History Main Topics  . Smoking status: Never Smoker   . Smokeless tobacco: Never Used  . Alcohol Use: 1.2 oz/week    2 Standard drinks or equivalent per week  . Drug Use: No  . Sexual Activity: Not  Currently    Birth Control/ Protection: Surgical   Other Topics Concern  . None   Social History Narrative   Patient is right handed.   Patient drinks 2 cups of coffee daily.   Past Surgical History  Procedure Laterality Date  . Tubal ligation    . Ablation colpoclesis  2004  . Cesarean section     Past Medical History  Diagnosis Date  . MS (multiple sclerosis) (HCC) 1992    Dr. Sandria Manly  . Depression   . Chronic daily headache 2003    [place]  . Multiple sclerosis (HCC) 11/21/2013  . Neurogenic bladder 11/21/2013  . Abnormality of gait 11/25/2014   BP 112/73 mmHg  Pulse 72  SpO2 98%  Opioid Risk Score:   Fall Risk Score:  `1  Depression screen PHQ 2/9  No flowsheet data found.   Review of Systems  Constitutional: Positive for fatigue. Negative for fever.  Eyes: Positive for visual disturbance (intermittent).  Neurological:       Headaches  All other systems reviewed and are negative.     Objective:   Physical Exam No TTP along head     Assessment & Plan:  Botox Injection for chronic migraine headaches ICD 10: G43.709  Dilution: 100 Units/ 2ml preservative free NS Indication: refractory headaches (At least 15 days  per month/headache lasting greater than 4 hours per day) incompletely responsive to other more conservative measures.  Informed consent was obtained after describing risks and benefits of the procedure with the patient. This includes bleeding, bruising, infection, excessive weakness, or medication side effects. A REMS form is on file and signed. Needle: 30g 1/2 inch needle  Number of units per muscle:  Right temporalis 10 units, 2 access points Left temporalis 10 units,  2 access points Right frontalis 5 units, 1 access point Left frontalis 5 units, 1 access points Procerus 5 units, 1 access point Right corrugator 5 units, 1 access point Left corrugator 5 units, 1 access point Right occipitalis 10 units, 2 access points Left occipitalis 10 units,  2 access points Right cervical paraspinal 5 units, 1 access points Left cervical paraspinals 5 units, 1 access points  Right trapezius 10 units, 2 access points Left trapezius 10 units, 2 access points   Remainin 5 units were discarded.   All injections were done after  after negative drawback for blood. The patient tolerated the procedure well, but with limited injection site due to discomfort. Post procedure instructions were given. A followup appointment was made.  RTC 3 months

## 2015-08-30 ENCOUNTER — Telehealth: Payer: Self-pay | Admitting: Neurology

## 2015-08-30 DIAGNOSIS — Z5181 Encounter for therapeutic drug level monitoring: Secondary | ICD-10-CM

## 2015-08-30 NOTE — Telephone Encounter (Signed)
-----   Message from York Spaniel, MD sent at 05/28/2015  7:34 AM EDT ----- Recheck CBC and comprehensive metabolic profile. Patient on Gilenya

## 2015-08-30 NOTE — Telephone Encounter (Signed)
I called patient. The patient is to come in and get blood work done, on Gilenya. White blood count usually run around 2, last absolute lymphocyte count was 0.3.

## 2015-08-31 ENCOUNTER — Telehealth: Payer: Self-pay

## 2015-08-31 ENCOUNTER — Other Ambulatory Visit (INDEPENDENT_AMBULATORY_CARE_PROVIDER_SITE_OTHER): Payer: Self-pay

## 2015-08-31 DIAGNOSIS — Z0289 Encounter for other administrative examinations: Secondary | ICD-10-CM

## 2015-08-31 DIAGNOSIS — Z5181 Encounter for therapeutic drug level monitoring: Secondary | ICD-10-CM

## 2015-08-31 NOTE — Telephone Encounter (Signed)
Talked to PT, she is still having headaches even after Botox on 12/29 from Dr. Allena Katz.Marland KitchenMarland KitchenShe mentioned something like Imitrex that she can take to give relief I mentioned my relief from headaches and told her I would message you about her still suffering with headaches and looking for relief.

## 2015-09-01 LAB — CBC WITH DIFFERENTIAL/PLATELET
BASOS: 0 %
Basophils Absolute: 0 10*3/uL (ref 0.0–0.2)
EOS (ABSOLUTE): 0 10*3/uL (ref 0.0–0.4)
EOS: 2 %
HEMATOCRIT: 37.5 % (ref 34.0–46.6)
Hemoglobin: 12.1 g/dL (ref 11.1–15.9)
IMMATURE GRANULOCYTES: 0 %
Immature Grans (Abs): 0 10*3/uL (ref 0.0–0.1)
Lymphocytes Absolute: 0.4 10*3/uL — ABNORMAL LOW (ref 0.7–3.1)
Lymphs: 15 %
MCH: 30.3 pg (ref 26.6–33.0)
MCHC: 32.3 g/dL (ref 31.5–35.7)
MCV: 94 fL (ref 79–97)
MONOS ABS: 0.3 10*3/uL (ref 0.1–0.9)
Monocytes: 10 %
NEUTROS ABS: 1.9 10*3/uL (ref 1.4–7.0)
NEUTROS PCT: 73 %
Platelets: 210 10*3/uL (ref 150–379)
RBC: 3.99 x10E6/uL (ref 3.77–5.28)
RDW: 13.4 % (ref 12.3–15.4)
WBC: 2.6 10*3/uL — ABNORMAL LOW (ref 3.4–10.8)

## 2015-09-01 LAB — COMPREHENSIVE METABOLIC PANEL
A/G RATIO: 2 (ref 1.1–2.5)
ALBUMIN: 4.1 g/dL (ref 3.5–5.5)
ALT: 46 IU/L — ABNORMAL HIGH (ref 0–32)
AST: 34 IU/L (ref 0–40)
Alkaline Phosphatase: 60 IU/L (ref 39–117)
BUN / CREAT RATIO: 16 (ref 9–23)
BUN: 16 mg/dL (ref 6–24)
Bilirubin Total: 0.2 mg/dL (ref 0.0–1.2)
CALCIUM: 9.2 mg/dL (ref 8.7–10.2)
CO2: 21 mmol/L (ref 18–29)
CREATININE: 0.99 mg/dL (ref 0.57–1.00)
Chloride: 106 mmol/L (ref 96–106)
GFR, EST AFRICAN AMERICAN: 79 mL/min/{1.73_m2} (ref >59)
GFR, EST NON AFRICAN AMERICAN: 69 mL/min/{1.73_m2} (ref >59)
GLOBULIN, TOTAL: 2.1 g/dL (ref 1.5–4.5)
Glucose: 80 mg/dL (ref 65–99)
POTASSIUM: 3.9 mmol/L (ref 3.5–5.2)
SODIUM: 142 mmol/L (ref 134–144)
TOTAL PROTEIN: 6.2 g/dL (ref 6.0–8.5)

## 2015-09-01 MED ORDER — SUMATRIPTAN SUCCINATE 100 MG PO TABS: 100.0000 mg | ORAL_TABLET | Freq: Two times a day (BID) | ORAL | Status: DC | PRN

## 2015-09-01 NOTE — Telephone Encounter (Signed)
I called the patient. The patient indicates that she has used phentermine previously for weight loss, she got this through her primary care doctor, we have never given her this prescription, she is to call the doctor that recently prescribed the medication. I will call in a prescription for Imitrex to take if needed for the headache.

## 2015-09-01 NOTE — Telephone Encounter (Signed)
I called the patient. She stated that her headache is better today. She thinks she will be okay without any additional headache medication at this time but she did request a Rx for Phentermine to help her lose weight. I advised that I would ask Dr. Anne Hahn about this and call her back.

## 2015-10-06 ENCOUNTER — Encounter (HOSPITAL_COMMUNITY): Payer: Self-pay | Admitting: Emergency Medicine

## 2015-10-06 ENCOUNTER — Other Ambulatory Visit (HOSPITAL_COMMUNITY)
Admission: RE | Admit: 2015-10-06 | Discharge: 2015-10-06 | Disposition: A | Discharge status: Home / Self Care | Payer: Medicare Other | Source: Ambulatory Visit | Attending: Family Medicine | Admitting: Family Medicine

## 2015-10-06 ENCOUNTER — Emergency Department (INDEPENDENT_AMBULATORY_CARE_PROVIDER_SITE_OTHER)
Admission: EM | Admit: 2015-10-06 | Discharge: 2015-10-06 | Disposition: A | Discharge status: Home / Self Care | Payer: Medicare Other | Source: Home / Self Care | Attending: Family Medicine | Admitting: Family Medicine

## 2015-10-06 DIAGNOSIS — A499 Bacterial infection, unspecified: Secondary | ICD-10-CM | POA: Diagnosis not present

## 2015-10-06 DIAGNOSIS — N760 Acute vaginitis: Secondary | ICD-10-CM | POA: Insufficient documentation

## 2015-10-06 DIAGNOSIS — Z113 Encounter for screening for infections with a predominantly sexual mode of transmission: Secondary | ICD-10-CM

## 2015-10-06 DIAGNOSIS — B9689 Other specified bacterial agents as the cause of diseases classified elsewhere: Secondary | ICD-10-CM

## 2015-10-06 MED ORDER — METRONIDAZOLE 500 MG PO TABS: 500.0000 mg | ORAL_TABLET | Freq: Three times a day (TID) | ORAL | Status: DC

## 2015-10-06 MED ORDER — PHENTERMINE HCL 37.5 MG PO CAPS: 37.5000 mg | ORAL_CAPSULE | ORAL | Status: DC

## 2015-10-06 NOTE — ED Provider Notes (Signed)
CSN: [id number]     Arrival date & time 10/06/15  1300 History   First MD Initiated Contact with Patient 10/06/15 1315     No chief complaint on file.  (Consider location/radiation/quality/duration/timing/severity/associated sxs/prior Treatment) HPI Sexual partner called today to advise patient that he has GC and that she should be tested and treated. She states he is the only sexual partner. No change in sexual partner.  Denies: fever chills, no history of previous STD PMH for MS   Past Medical History  Diagnosis Date  . MS (multiple sclerosis) (HCC) 1992    Dr. Sandria Manly  . Depression   . Chronic daily headache 2003    [facility]  . Multiple sclerosis (HCC) 11/21/2013  . Neurogenic bladder 11/21/2013  . Abnormality of gait 11/25/2014   Past Surgical History  Procedure Laterality Date  . Tubal ligation    . Ablation colpoclesis  2004  . Cesarean section     Family History  Problem Relation Age of Onset  . Heart failure Father   . Multiple sclerosis Sister   . Cancer Neg Hx   . Diabetes Neg Hx   . Hyperlipidemia Neg Hx   . Hypertension Neg Hx   . Kidney disease Neg Hx   . Stroke Neg Hx    Social History  Substance Use Topics  . Smoking status: Never Smoker   . Smokeless tobacco: Never Used  . Alcohol Use: 1.2 oz/week    2 Standard drinks or equivalent per week   OB History    Gravida Para Term Preterm AB TAB SAB Ectopic Multiple Living   Review of Systems Exposure to STD Allergies  Review of patient's allergies indicates no known allergies.  Home Medications   Prior to Admission medications   Medication Sig Start Date End Date Taking? Authorizing Provider  acyclovir (ZOVIRAX) 400 MG tablet Take 1 tablet (400 mg total) by mouth 1 day or 1 dose. 09/15/13   Elson Areas, PA-C  amitriptyline (ELAVIL) 25 MG tablet Take 2 tablets (50 mg total) by mouth at bedtime. 06/07/15   Anson Fret, MD  amLODipine (NORVASC) 5 MG tablet Take 5 mg by mouth daily.  11/14/13   Historical Provider, MD  benazepril (LOTENSIN) 20 MG tablet Take 20 mg by mouth daily. 11/14/13   Historical Provider, MD  Fingolimod HCl (GILENYA) 0.5 MG CAPS Take 1 capsule (0.5 mg total) by mouth daily. 06/28/15   Porfirio Mylar Dohmeier, MD  SUMAtriptan (IMITREX) 100 MG tablet Take 1 tablet (100 mg total) by mouth 2 (two) times daily as needed for migraine. 09/01/15   York Spaniel, MD  tolterodine (DETROL) 2 MG tablet Take 2 mg by mouth 2 (two) times daily.    Historical Provider, MD  topiramate (TOPAMAX) 50 MG tablet Take 3 tablets (150 mg total) by mouth at bedtime. 05/27/15   York Spaniel, MD  traZODone (DESYREL) 100 MG tablet  05/27/15   Historical Provider, MD  zaleplon (SONATA) 10 MG capsule Take 1 capsule (10 mg total) by mouth at bedtime as needed for sleep. 06/28/15   Melvyn Novas, MD   Meds Ordered and Administered this Visit  Medications - No data to display  There were no vitals taken for this visit. No data found.   Physical Exam NURSES NOTES AND VITAL SIGNS REVIEWED. CONSTITUTIONAL: Well developed, well nourished, no acute distress HEENT: normocephalic, atraumatic, right and left TM's are normal  EYES: Conjunctiva normal NECK:normal ROM, supple, no adenopathy PULMONARY:No respiratory distress, normal effort, Lungs: CTAb/l, no wheezes, or increased work of breathing CARDIOVASCULAR: RRR, no murmur ABDOMEN: soft, ND, NT, +'ve BS, without CVA -T MUSCULOSKELETAL: Normal ROM of all extremities,  SKIN: warm and dry without rash PSYCHIATRIC: Mood and affect, behavior are normal Vaginal exam performed with female chaperone and patient's permission. Minimal vaginal discharge noted. There is some fishy odor noted. ED Course  Procedures (including critical care time)  Labs Review Labs Reviewed  CERVICOVAGINAL ANCILLARY ONLY    Imaging Review No results found.   Visual Acuity Review  Right Eye Distance:   Left Eye Distance:   Bilateral Distance:    Right  Eye Near:   Left Eye Near:    Bilateral Near:        Patient is chosen to await return of culture prior to getting treatment. He will accept treatment for bacterial vaginosis and a prescription senttoherpharmacy. MDM   1. Screen for STD (sexually transmitted disease)   2. BV (bacterial vaginosis)    Patient is reassured that there is no indication for more advance testing at this time.  Patient is advised to continue home symptomatic treatment.  Patient is advised that if there are new or worsening symptoms or attend the emergency department, or contact primary care provider. Instructions of care provided discharged home in stable condition. Return to work/school note provided.  THIS NOTE WAS GENERATED USING A VOICE RECOGNITION SOFTWARE PROGRAM. ALL REASONABLE EFFORTS  WERE MADE TO PROOFREAD THIS DOCUMENT FOR ACCURACY.     Tharon Aquas, PA 10/06/15 2011

## 2015-10-06 NOTE — Discharge Instructions (Signed)
Bacterial Vaginosis Bacterial vaginosis is an infection of the vagina. It happens when too many germs (bacteria) grow in the vagina. Having this infection puts you at risk for getting other infections from sex. Treating this infection can help lower your risk for other infections, such as:   Chlamydia.  Gonorrhea.  HIV.  Herpes. HOME CARE  Take your medicine as told by your doctor.  Finish your medicine even if you start to feel better.  Tell your sex partner that you have an infection. They should see their doctor for treatment.  During treatment:  Avoid sex or use condoms correctly.  Do not douche.  Do not drink alcohol unless your doctor tells you it is ok.  Do not breastfeed unless your doctor tells you it is ok. GET HELP IF:  You are not getting better after 3 days of treatment.  You have more grey fluid (discharge) coming from your vagina than before.  You have more pain than before.  You have a fever. MAKE SURE YOU:   Understand these instructions.  Will watch your condition.  Will get help right away if you are not doing well or get worse.   This information is not intended to replace advice given to you by your health care provider. Make sure you discuss any questions you have with your health care provider.   Document Released: 05/02/2008 Document Revised: 08/14/2014 Document Reviewed: 03/05/2013 Elsevier Interactive Patient Education [employer]  Gonorrhea Testing Gonorrhea is a bacterial infection that spreads from person to person through sexual contact. It can also spread from a mother to her baby during childbirth. You may be tested for gonorrhea if:  You have symptoms of gonorrhea.  You are pregnant or plan to get pregnant.  You have multiple sexual partners. There are three gonorrhea tests:  Nucleic acid amplification test. For this test, you will provide a urine sample or your health care provider will use a swab to collect a fluid  sample from the area of possible infection.  Nucleic acid hybridization test. For this test, your health care provider will use a swab to collect a fluid sample from the penis or vagina.  Gonorrhea culture. For this test, your health care provider will use a swab to collect a sample of bodily fluid from the area of possible infection. This test may be done in cases of sexual assault for legal reasons. In all three methods of testing, the sample is sent to a laboratory for analysis. PREPARATION FOR TEST  Let your health care provider know if you are taking antibiotic medicine.  Do not use vaginal creams or douches.  Try to arrive with a full bladder. RESULTS It is your responsibility to obtain your test results. Ask the lab or department performing the test when and how you will get your results. Contact your health care provider to discuss any questions you have about your results. The results of your gonorrhea test will either be negative or positive. Meaning of Negative Test Results If your test result is negative, then it is most likely that you do not have the disease. Meaning of Positive Test Results If your test result is positive, you have an active gonorrhea infection. Notify any sexual partners so that they can also be tested.   This information is not intended to replace advice given to you by your health care provider. Make sure you discuss any questions you have with your health care provider.   Document Released: 08/25/2004 Document  Revised: 08/14/2014 Document Reviewed: 10/27/2013 Elsevier Interactive Patient Education [employer]

## 2015-10-06 NOTE — ED Notes (Signed)
Delay in removal from epic, secondary to orders placed.

## 2015-10-07 LAB — CERVICOVAGINAL ANCILLARY ONLY
Chlamydia: NEGATIVE
Neisseria Gonorrhea: NEGATIVE

## 2015-10-08 LAB — CERVICOVAGINAL ANCILLARY ONLY: Wet Prep (BD Affirm): NEGATIVE

## 2015-10-14 ENCOUNTER — Telehealth (HOSPITAL_COMMUNITY): Payer: Self-pay | Admitting: Emergency Medicine

## 2015-10-14 NOTE — ED Notes (Signed)
Called pt and notified of recent lab results from visit 3/1 Pt ID'd properly... Reports feeling better but still having d/c... Adv pt to f/u w/GYN Pt satisfied w/call and visit.   Per Dr. Dayton Scrape,  Please let patient know that tests for yeast (candida), bacterial vaginosis (gardnerella), trichomonas, and chlamydia/gonorrhea were negative. Recheck or followup pcp/Scott Gurley for persistent symptoms. LM  Adv pt if sx are not getting better to return  Pt verb understanding Education on safe sex given

## 2015-11-02 ENCOUNTER — Emergency Department (INDEPENDENT_AMBULATORY_CARE_PROVIDER_SITE_OTHER)
Admission: EM | Admit: 2015-11-02 | Discharge: 2015-11-02 | Disposition: A | Discharge status: Home / Self Care | Payer: Medicare Other | Source: Home / Self Care | Attending: Family Medicine | Admitting: Family Medicine

## 2015-11-02 ENCOUNTER — Encounter (HOSPITAL_COMMUNITY): Payer: Self-pay | Admitting: Emergency Medicine

## 2015-11-02 ENCOUNTER — Telehealth: Payer: Self-pay | Admitting: Neurology

## 2015-11-02 DIAGNOSIS — Z760 Encounter for issue of repeat prescription: Secondary | ICD-10-CM | POA: Diagnosis not present

## 2015-11-02 DIAGNOSIS — R05 Cough: Secondary | ICD-10-CM | POA: Diagnosis not present

## 2015-11-02 DIAGNOSIS — R059 Cough, unspecified: Secondary | ICD-10-CM

## 2015-11-02 MED ORDER — AZITHROMYCIN 250 MG PO TABS: 250.0000 mg | ORAL_TABLET | Freq: Every day | ORAL | Status: DC

## 2015-11-02 MED ORDER — VALACYCLOVIR HCL 1 G PO TABS: 1000.0000 mg | ORAL_TABLET | Freq: Two times a day (BID) | ORAL | Status: DC

## 2015-11-02 NOTE — Telephone Encounter (Signed)
Patient is calling to get a refill for Rx valacyclovir 1 gram tablets filled at [facility] on [address], [place].

## 2015-11-02 NOTE — Telephone Encounter (Signed)
We have not prescribed this for pt.  She wanted to have on hand just in case she needed it.  I told her that she could call her pharmacy and see who and how often she has gotten this previously.  She may need to contact her pcp. She verbalized understanding.

## 2015-11-02 NOTE — ED Provider Notes (Signed)
CSN: [id number]     Arrival date & time 11/02/15  1639 History   First MD Initiated Contact with Patient 11/02/15 1822     Chief Complaint  Patient presents with  . Headache  . Cough  . Abdominal Pain   (Consider location/radiation/quality/duration/timing/severity/associated sxs/prior Treatment) HPI Dry non productive cough that has been lingering for a couple of months. Dry hacking worse at night.  No sputum. No bronchospasm. Past Medical History  Diagnosis Date  . MS (multiple sclerosis) (HCC) 1992    Dr. Sandria Manly  . Depression   . Chronic daily headache 2003    Love  . Multiple sclerosis (HCC) 11/21/2013  . Neurogenic bladder 11/21/2013  . Abnormality of gait 11/25/2014   Past Surgical History  Procedure Laterality Date  . Tubal ligation    . Ablation colpoclesis  2004  . Cesarean section     Family History  Problem Relation Age of Onset  . Heart failure Father   . Multiple sclerosis Sister   . Cancer Neg Hx   . Diabetes Neg Hx   . Hyperlipidemia Neg Hx   . Hypertension Neg Hx   . Kidney disease Neg Hx   . Stroke Neg Hx    Social History  Substance Use Topics  . Smoking status: Never Smoker   . Smokeless tobacco: Never Used  . Alcohol Use: 1.2 oz/week    2 Standard drinks or equivalent per week   OB History    Gravida Para Term Preterm AB TAB SAB Ectopic Multiple Living   Review of Systems cough  Allergies  Review of patient's allergies indicates no known allergies.  Home Medications   Prior to Admission medications   Medication Sig Start Date End Date Taking? Authorizing Provider  acyclovir (ZOVIRAX) 400 MG tablet Take 1 tablet (400 mg total) by mouth 1 day or 1 dose. 09/15/13  Yes Lonia Skinner Sofia, PA-C  amitriptyline (ELAVIL) 25 MG tablet Take 2 tablets (50 mg total) by mouth at bedtime. 06/07/15  Yes Anson Fret, MD  amLODipine (NORVASC) 5 MG tablet Take 5 mg by mouth daily. 11/14/13  Yes Historical Provider, MD  benazepril  (LOTENSIN) 20 MG tablet Take 20 mg by mouth daily. 11/14/13  Yes Historical Provider, MD  Fingolimod HCl (GILENYA) 0.5 MG CAPS Take 1 capsule (0.5 mg total) by mouth daily. 06/28/15  Yes Porfirio Mylar Dohmeier, MD  SUMAtriptan (IMITREX) 100 MG tablet Take 1 tablet (100 mg total) by mouth 2 (two) times daily as needed for migraine. 09/01/15  Yes York Spaniel, MD  tolterodine (DETROL) 2 MG tablet Take 2 mg by mouth 2 (two) times daily.   Yes Historical Provider, MD  topiramate (TOPAMAX) 50 MG tablet Take 3 tablets (150 mg total) by mouth at bedtime. 05/27/15  Yes York Spaniel, MD  traZODone (DESYREL) 100 MG tablet  05/27/15  Yes Historical Provider, MD  zaleplon (SONATA) 10 MG capsule Take 1 capsule (10 mg total) by mouth at bedtime as needed for sleep. 06/28/15  Yes Melvyn Novas, MD  azithromycin (ZITHROMAX) 250 MG tablet Take 1 tablet (250 mg total) by mouth daily. Take first 2 tablets together, then 1 every day until finished. 11/02/15   Tharon Aquas, PA  metroNIDAZOLE (FLAGYL) 500 MG tablet Take 1 tablet (500 mg total) by mouth 3 (three) times daily. 10/06/15   Tharon Aquas, PA  phentermine 37.5 MG capsule Take 1 capsule (  37.5 mg total) by mouth every morning. 10/06/15   Tharon Aquas, PA  valACYclovir (VALTREX) 1000 MG tablet Take 1 tablet (1,000 mg total) by mouth 2 (two) times daily. 11/02/15   Tharon Aquas, PA   Meds Ordered and Administered this Visit  Medications - No data to display  BP 141/86 mmHg  Pulse 68  Temp(Src) 99.9 F (37.7 C) (Oral)  Resp 16  SpO2  No data found.   Physical Exam NURSES NOTES AND VITAL SIGNS REVIEWED. CONSTITUTIONAL: Well developed, well nourished, no acute distress HEENT: normocephalic, atraumatic, right and left TM's are normal EYES: Conjunctiva normal NECK:normal ROM, supple, no adenopathy PULMONARY:No respiratory distress, normal effort, Lungs: CTAb/l, no wheezes, or increased work of breathing CARDIOVASCULAR: RRR, no murmur ABDOMEN: soft,  ND, NT, +'ve BS MUSCULOSKELETAL: Normal ROM of all extremities,  SKIN: warm and dry without rash PSYCHIATRIC: Mood and affect, behavior are normal  ED Course  Procedures (including critical care time)  Labs Review Labs Reviewed - No data to display  Imaging Review No results found.   Visual Acuity Review  Right Eye Distance:   Left Eye Distance:   Bilateral Distance:    Right Eye Near:   Left Eye Near:    Bilateral Near:       rx valcyclovir (refill) zpak  MDM   1. Cough   2. Medication refill     Patient is reassured that there are no issues that require transfer to higher level of care at this time or additional tests. Patient is advised to continue home symptomatic treatment. Patient is advised that if there are new or worsening symptoms to attend the emergency department, contact primary care provider, or return to UC. Instructions of care provided discharged home in stable condition. Return to work/school note provided.   THIS NOTE WAS GENERATED USING A VOICE RECOGNITION SOFTWARE PROGRAM. ALL REASONABLE EFFORTS  WERE MADE TO PROOFREAD THIS DOCUMENT FOR ACCURACY.  I have verbally reviewed the discharge instructions with the patient. A printed AVS was given to the patient.  All questions were answered prior to discharge.      Tharon Aquas, PA 11/02/15 2004

## 2015-11-02 NOTE — ED Notes (Signed)
The patient presented to the [facility] with a complaint of a cough that has been off and on since last November. The patient also complained of a headache and lower abdominal pain x 2 days. The patient also requested a medication refill on her acyclovir.

## 2015-11-02 NOTE — Discharge Instructions (Signed)
Cough, Adult A cough helps to clear your throat and lungs. A cough may last only 2-3 weeks (acute), or it may last longer than 8 weeks (chronic). Many different things can cause a cough. A cough may be a sign of an illness or another medical condition. HOME CARE  Pay attention to any changes in your cough.  Take medicines only as told by your doctor.  If you were prescribed an antibiotic medicine, take it as told by your doctor. Do not stop taking it even if you start to feel better.  Talk with your doctor before you try using a cough medicine.  Drink enough fluid to keep your pee (urine) clear or pale yellow.  If the air is dry, use a cold steam vaporizer or humidifier in your home.  Stay away from things that make you cough at work or at home.  If your cough is worse at night, try using extra pillows to raise your head up higher while you sleep.  Do not smoke, and try not to be around smoke. If you need help quitting, ask your doctor.  Do not have caffeine.  Do not drink alcohol.  Rest as needed. GET HELP IF:  You have new problems (symptoms).  You cough up yellow fluid (pus).  Your cough does not get better after 2-3 weeks, or your cough gets worse.  Medicine does not help your cough and you are not sleeping well.  You have pain that gets worse or pain that is not helped with medicine.  You have a fever.  You are losing weight and you do not know why.  You have night sweats. GET HELP RIGHT AWAY IF:  You cough up blood.  You have trouble breathing.  Your heartbeat is very fast.   This information is not intended to replace advice given to you by your health care provider. Make sure you discuss any questions you have with your health care provider.   Document Released: 04/06/2011 Document Revised: 04/14/2015 Document Reviewed: 09/30/2014 Elsevier Interactive Patient Education [employer] Medicine Refill at the Emergency Department We have refilled  your medicine today, but it is best for you to get refills through your primary health care provider's office. In the future, please plan ahead so you do not need to get refills from the emergency department. If the medicine we refilled was a maintenance medicine, you may have received only enough to get you by until you are able to see your regular health care provider.   This information is not intended to replace advice given to you by your health care provider. Make sure you discuss any questions you have with your health care provider.   Document Released: 11/10/2003 Document Revised: 08/14/2014 Document Reviewed: 10/31/2013 Elsevier Interactive Patient Education [employer]

## 2015-11-03 ENCOUNTER — Encounter: Payer: Medicare Other | Attending: Physical Medicine & Rehabilitation | Admitting: Physical Medicine & Rehabilitation

## 2015-11-03 DIAGNOSIS — F329 Major depressive disorder, single episode, unspecified: Secondary | ICD-10-CM | POA: Insufficient documentation

## 2015-11-03 DIAGNOSIS — R531 Weakness: Secondary | ICD-10-CM | POA: Insufficient documentation

## 2015-11-03 DIAGNOSIS — G43711 Chronic migraine without aura, intractable, with status migrainosus: Secondary | ICD-10-CM | POA: Insufficient documentation

## 2015-11-03 DIAGNOSIS — N319 Neuromuscular dysfunction of bladder, unspecified: Secondary | ICD-10-CM | POA: Insufficient documentation

## 2015-11-03 DIAGNOSIS — R269 Unspecified abnormalities of gait and mobility: Secondary | ICD-10-CM | POA: Insufficient documentation

## 2015-11-03 DIAGNOSIS — G35 Multiple sclerosis: Secondary | ICD-10-CM | POA: Insufficient documentation

## 2015-11-24 ENCOUNTER — Telehealth: Payer: Self-pay | Admitting: Adult Health

## 2015-11-24 ENCOUNTER — Ambulatory Visit: Payer: Medicare Other | Admitting: Adult Health

## 2015-11-24 NOTE — Telephone Encounter (Signed)
Pt called apologized she missed her appt today. Said she got confused with IRS appt.

## 2015-11-25 ENCOUNTER — Ambulatory Visit: Payer: Medicare Other | Admitting: Adult Health

## 2015-11-25 ENCOUNTER — Encounter: Payer: Self-pay | Admitting: Adult Health

## 2015-11-25 NOTE — Telephone Encounter (Signed)
Noted  

## 2015-12-01 ENCOUNTER — Ambulatory Visit (INDEPENDENT_AMBULATORY_CARE_PROVIDER_SITE_OTHER): Payer: Medicare Other | Admitting: Adult Health

## 2015-12-01 ENCOUNTER — Encounter: Payer: Self-pay | Admitting: Adult Health

## 2015-12-01 VITALS — BP 122/89 | HR 71 | Ht 64.0 in | Wt 209.0 lb

## 2015-12-01 DIAGNOSIS — R51 Headache: Secondary | ICD-10-CM

## 2015-12-01 DIAGNOSIS — Z5181 Encounter for therapeutic drug level monitoring: Secondary | ICD-10-CM | POA: Diagnosis not present

## 2015-12-01 DIAGNOSIS — G35 Multiple sclerosis: Secondary | ICD-10-CM

## 2015-12-01 DIAGNOSIS — G8929 Other chronic pain: Secondary | ICD-10-CM

## 2015-12-01 MED ORDER — TOPIRAMATE 50 MG PO TABS: 200.0000 mg | ORAL_TABLET | Freq: Every day | ORAL | Status: DC

## 2015-12-01 NOTE — Patient Instructions (Signed)
Increase topamax to 200 mg at bedtime Continue Gilenya Blood work today If your symptoms worsen or you develop new symptoms please let us know.

## 2015-12-01 NOTE — Progress Notes (Signed)
PATIENT: Connie Robertson DOB: 1970/10/29  REASON FOR VISIT: follow up- multiple sclerosis, headaches HISTORY FROM: patient  HISTORY OF PRESENT ILLNESS: Connie Robertson is a 45 year old female with a history of multiple sclerosis and headache. She returns today for follow-up. The patient has remained on Gilenya and is tolerating it well. She denies any new numbness or weakness. She states that she has left hand numbness but this has been ongoing. She denies any changes with her gait or balance. She reports that her balance has always been slightly off. She states that at times her legs to feel very heavy. She denies any significant changes with the bowels or bladder. She does have urinary urgency and is on oxybutynin. She denies any significant changes with her vision. She does report that she continues to have daily headaches. She is on Topamax 150 mg at bedtime. She reports that her headaches started after pregnancy. She has tried Botox in the past with no benefit. She states that typically her headache is very dull however was severe headaches she will have photophobia and phonophobia as well as nausea. She denies any new neurological symptoms. She returns today for an evaluation.   HISTORY 05/27/15 (WILLIS): Connie Robertson is a 45 year old right-handed black female with a history of multiple sclerosis. The patient is on Gilenya, she is tolerating the medication well. The patient was out in the heat yesterday, this seemed to wipe her out significantly, the patient is doing better today. The patient has some chronic issues with fatigue, insomnia. The patient takes trazodone 50 mg at night for sleep which is not completely effective. She also is having chronic daily headaches, she has had headaches since a pregnancy 15 years ago. The patient does have some photophobia and phonophobia with the headache. The patient works through the headache, she is rarely incapacitated with the headache. The patient has  not noted a new numbness, weakness episodes. The patient was referred to urology when last seen, she never followed up with this appointment. She continues have some issues with an urgency of the bladder. The patient has prominent issues with autoimmune diseases in the family. The patient has a sister with multiple sclerosis, a niece with type 1 diabetes, and the mother has rheumatoid arthritis. The patient returns for an evaluation.  REVIEW OF SYSTEMS: Out of a complete 14 system review of symptoms, the patient complains only of the following symptoms, and all other reviewed systems are negative.  ALLERGIES: No Known Allergies  HOME MEDICATIONS: Outpatient Prescriptions Prior to Visit  Medication Sig Dispense Refill  . acyclovir (ZOVIRAX) 400 MG tablet Take 1 tablet (400 mg total) by mouth 1 day or 1 dose. 90 tablet 0  . amitriptyline (ELAVIL) 25 MG tablet Take 2 tablets (50 mg total) by mouth at bedtime. 60 tablet 6  . amLODipine (NORVASC) 5 MG tablet Take 5 mg by mouth daily.    . benazepril (LOTENSIN) 20 MG tablet Take 20 mg by mouth daily.    . Fingolimod HCl (GILENYA) 0.5 MG CAPS Take 1 capsule (0.5 mg total) by mouth daily. 30 capsule 6  . phentermine 37.5 MG capsule Take 1 capsule (37.5 mg total) by mouth every morning. 30 capsule 1  . SUMAtriptan (IMITREX) 100 MG tablet Take 1 tablet (100 mg total) by mouth 2 (two) times daily as needed for migraine. 10 tablet 2  . traZODone (DESYREL) 100 MG tablet   5  . zaleplon (SONATA) 10 MG capsule Take 1 capsule (10 mg total)  by mouth at bedtime as needed for sleep. 30 capsule 0  . topiramate (TOPAMAX) 50 MG tablet Take 3 tablets (150 mg total) by mouth at bedtime. 90 tablet 5  . azithromycin (ZITHROMAX) 250 MG tablet Take 1 tablet (250 mg total) by mouth daily. Take first 2 tablets together, then 1 every day until finished. (Patient not taking: Reported on 12/01/2015) 6 tablet 0  . metroNIDAZOLE (FLAGYL) 500 MG tablet Take 1 tablet (500 mg total)  by mouth 3 (three) times daily. 21 tablet 0  . tolterodine (DETROL) 2 MG tablet Take 2 mg by mouth 2 (two) times daily.    . valACYclovir (VALTREX) 1000 MG tablet Take 1 tablet (1,000 mg total) by mouth 2 (two) times daily. 90 tablet 1   No facility-administered medications prior to visit.    PAST MEDICAL HISTORY: Past Medical History  Diagnosis Date  . MS (multiple sclerosis) (HCC) 1992    Dr. Sandria Manly  . Depression   . Chronic daily headache 2003    Love  . Multiple sclerosis (HCC) 11/21/2013  . Neurogenic bladder 11/21/2013  . Abnormality of gait 11/25/2014    PAST SURGICAL HISTORY: Past Surgical History  Procedure Laterality Date  . Tubal ligation    . Ablation colpoclesis  2004  . Cesarean section      FAMILY HISTORY: Family History  Problem Relation Age of Onset  . Heart failure Father   . Multiple sclerosis Sister   . Cancer Neg Hx   . Diabetes Neg Hx   . Hyperlipidemia Neg Hx   . Hypertension Neg Hx   . Kidney disease Neg Hx   . Stroke Neg Hx     SOCIAL HISTORY: Social History   Social History  . Marital Status: Divorced    Spouse Name: N/A  . Number of Children: 2  . Years of Education: college   Occupational History  . disability    Social History Main Topics  . Smoking status: Never Smoker   . Smokeless tobacco: Never Used  . Alcohol Use: 1.2 oz/week    2 Standard drinks or equivalent per week  . Drug Use: No  . Sexual Activity: Not Currently    Birth Control/ Protection: Surgical   Other Topics Concern  . Not on file   Social History Narrative   Patient is right handed.   Patient drinks 2 cups of coffee daily.      PHYSICAL EXAM  Filed Vitals:   12/01/15 1124  BP: 122/89  Pulse: 71  Height: 5\' 4"  (1.626 m)  Weight: 209 lb (94.802 kg)   Body mass index is 35.86 kg/(m^2).  Generalized: Well developed, in no acute distress   Neurological examination  Mentation: Alert oriented to time, place, history taking. Follows all commands  speech and language fluent Cranial nerve II-XII: Pupils were equal round reactive to light. Extraocular movements were full, visual field were full on confrontational test. Facial sensation and strength were normal. Uvula tongue midline. Head turning and shoulder shrug  were normal and symmetric. Motor: The motor testing reveals 5 over 5 strength of all 4 extremities. Good symmetric motor tone is noted throughout.  Sensory: Sensory testing is intact to soft touch on all 4 extremities. No evidence of extinction is noted.  Coordination: Cerebellar testing reveals good finger-nose-finger and heel-to-shin bilaterally.  Gait and station: Gait is normal. Tandem gait is normal. Romberg is negative. No drift is seen.  Reflexes: Deep tendon reflexes are symmetric and normal bilaterally.   DIAGNOSTIC  DATA (LABS, IMAGING, TESTING) - I reviewed patient records, labs, notes, testing and imaging myself where available.  Lab Results  Component Value Date   WBC 2.6* 08/31/2015   HGB 12.1 04/05/2015   HCT 37.5 08/31/2015   MCV 94 08/31/2015   PLT 210 08/31/2015      Component Value Date/Time   NA 142 08/31/2015 1211   NA 142 04/05/2015 1435   NA 142 08/18/2011 1558   K 3.9 08/31/2015 1211   K 3.9 04/05/2015 1435   CL 106 08/31/2015 1211   CO2 21 08/31/2015 1211   CO2 24 04/05/2015 1435   GLUCOSE 80 08/31/2015 1211   GLUCOSE 89 04/05/2015 1435   GLUCOSE 91 08/18/2011 1558   BUN 16 08/31/2015 1211   BUN 18.2 04/05/2015 1435   BUN 19 08/18/2011 1558   CREATININE 0.99 08/31/2015 1211   CREATININE 1.1 04/05/2015 1435   CALCIUM 9.2 08/31/2015 1211   CALCIUM 9.3 04/05/2015 1435   PROT 6.2 08/31/2015 1211   PROT 6.9 04/05/2015 1435   PROT 6.7 08/18/2011 1558   ALBUMIN 4.1 08/31/2015 1211   ALBUMIN 4.2 04/05/2015 1435   ALBUMIN 4.0 08/18/2011 1558   AST 34 08/31/2015 1211   AST 27 04/05/2015 1435   ALT 46* 08/31/2015 1211   ALT 46 04/05/2015 1435   ALKPHOS 60 08/31/2015 1211   ALKPHOS 53  04/05/2015 1435   BILITOT 0.2 08/31/2015 1211   BILITOT 0.22 04/05/2015 1435   BILITOT 0.3 11/21/2013 1128   GFRNONAA 69 08/31/2015 1211   GFRAA 79 08/31/2015 1211     ASSESSMENT AND PLAN 45 y.o. year old female  has a past medical history of MS (multiple sclerosis) (HCC) (1992); Depression; Chronic daily headache (2003); Multiple sclerosis (HCC) (11/21/2013); Neurogenic bladder (11/21/2013); and Abnormality of gait (11/25/2014). here with:  1. Multiple sclerosis 2. Chronic daily headache  The patient will continue on Gilenya. I will check blood work today. Patient has continued to have chronic headaches. I will increase Topamax to 200 mg daily. If this is not beneficial she should let us know. She will follow-up in 6 months or sooner if needed.     Butch Penny, MSN, NP-C 12/01/2015, 1:11 PM [facility] [address] [place], [place] [address] [phone]

## 2015-12-02 LAB — CBC WITH DIFFERENTIAL/PLATELET
BASOS: 0 %
Basophils Absolute: 0 10*3/uL (ref 0.0–0.2)
EOS (ABSOLUTE): 0.1 10*3/uL (ref 0.0–0.4)
EOS: 2 %
HEMOGLOBIN: 12 g/dL (ref 11.1–15.9)
Hematocrit: 36.4 % (ref 34.0–46.6)
IMMATURE GRANS (ABS): 0 10*3/uL (ref 0.0–0.1)
Immature Granulocytes: 0 %
LYMPHS: 17 %
Lymphocytes Absolute: 0.5 10*3/uL — ABNORMAL LOW (ref 0.7–3.1)
MCH: 31.1 pg (ref 26.6–33.0)
MCHC: 33 g/dL (ref 31.5–35.7)
MCV: 94 fL (ref 79–97)
MONOCYTES: 14 %
Monocytes Absolute: 0.4 10*3/uL (ref 0.1–0.9)
NEUTROS ABS: 1.8 10*3/uL (ref 1.4–7.0)
Neutrophils: 67 %
Platelets: 183 10*3/uL (ref 150–379)
RBC: 3.86 x10E6/uL (ref 3.77–5.28)
RDW: 13.7 % (ref 12.3–15.4)
WBC: 2.7 10*3/uL — ABNORMAL LOW (ref 3.4–10.8)

## 2015-12-02 LAB — COMPREHENSIVE METABOLIC PANEL
A/G RATIO: 2.1 (ref 1.2–2.2)
ALT: 219 IU/L — AB (ref 0–32)
AST: 114 IU/L — ABNORMAL HIGH (ref 0–40)
Albumin: 4.4 g/dL (ref 3.5–5.5)
Alkaline Phosphatase: 60 IU/L (ref 39–117)
BUN/Creatinine Ratio: 16 (ref 9–23)
BUN: 15 mg/dL (ref 6–24)
Bilirubin Total: <0.2 mg/dL (ref 0.0–1.2)
CALCIUM: 9.4 mg/dL (ref 8.7–10.2)
CO2: 26 mmol/L (ref 18–29)
Chloride: 104 mmol/L (ref 96–106)
Creatinine, Ser: 0.95 mg/dL (ref 0.57–1.00)
GFR, EST AFRICAN AMERICAN: 83 mL/min/{1.73_m2} (ref >59)
GFR, EST NON AFRICAN AMERICAN: 72 mL/min/{1.73_m2} (ref >59)
GLUCOSE: 81 mg/dL (ref 65–99)
Globulin, Total: 2.1 g/dL (ref 1.5–4.5)
Potassium: 4.5 mmol/L (ref 3.5–5.2)
Sodium: 143 mmol/L (ref 134–144)
TOTAL PROTEIN: 6.5 g/dL (ref 6.0–8.5)

## 2015-12-03 ENCOUNTER — Telehealth: Payer: Self-pay | Admitting: Adult Health

## 2015-12-03 NOTE — Telephone Encounter (Signed)
I called the patient in regards to her lab work. Left VM. I will call back on Monday.

## 2015-12-06 ENCOUNTER — Telehealth: Payer: Self-pay | Admitting: Adult Health

## 2015-12-06 MED ORDER — FINGOLIMOD HCL 0.5 MG PO CAPS: 0.5000 mg | ORAL_CAPSULE | ORAL | Status: DC

## 2015-12-06 NOTE — Telephone Encounter (Signed)
I called the patient. Her lab work reveals an elevated AST and ALT . I consulted with Dr. Anne Hahn. We will ask the patient to start taking Gilenya every other day and recheck lab work in one month. Patient is amenable to this plan.

## 2015-12-06 NOTE — Progress Notes (Signed)
I have reviewed and agreed above plan. 

## 2015-12-10 ENCOUNTER — Telehealth: Payer: Self-pay | Admitting: Neurology

## 2015-12-10 NOTE — Telephone Encounter (Signed)
Patient paged the on call doctor. She stated that last week she had a discussion with the nurse in our office that her liver enzymes were elevated and she wanted to discuss this further. She denies any current new symptoms or complaints, no abdominal pain, no nausea or vomiting or anything concerning or any new focal neurologic deficits. I advised her this is the after hours phone number and she should call back on Monday to discuss with the provider she sees here in th office so she can get the best information as related to her care and situation. She should proceed to the ED for anything concerning.

## 2015-12-13 ENCOUNTER — Telehealth: Payer: Self-pay | Admitting: Neurology

## 2015-12-13 NOTE — Telephone Encounter (Signed)
I will call the patient in three weeks to remind her to come in for blood work.

## 2015-12-13 NOTE — Telephone Encounter (Addendum)
Patient called through answerphone 5/5 2:10pm, "feeling more tired lately, maybe due to a med?"

## 2015-12-13 NOTE — Telephone Encounter (Signed)
I called the patient. The liver enzymes are elevated, we will be enough in about 3 weeks with another test. If the enzymes remain elevated we will need to stop the Gilenya, and consider going on to another medication.

## 2015-12-13 NOTE — Telephone Encounter (Signed)
Message already addressed by Dr Lucia Gaskins. See phone note.

## 2016-01-06 ENCOUNTER — Telehealth: Payer: Self-pay | Admitting: Adult Health

## 2016-01-06 DIAGNOSIS — R748 Abnormal levels of other serum enzymes: Secondary | ICD-10-CM

## 2016-01-06 NOTE — Telephone Encounter (Signed)
Please have the patient come in for blood work to check liver enzymes.

## 2016-01-06 NOTE — Telephone Encounter (Signed)
-----   Message from Butch Penny, NP sent at 12/06/2015  4:06 PM EDT ----- Regarding: ast/alt ast/alt

## 2016-01-06 NOTE — Telephone Encounter (Signed)
I tried calling but no answer and no vm. Will try again later

## 2016-01-11 NOTE — Telephone Encounter (Signed)
LM on VM to come in for repeat blood work. I gave office hours and call back number for any questions.

## 2016-01-17 ENCOUNTER — Telehealth: Payer: Self-pay | Admitting: Adult Health

## 2016-01-17 NOTE — Telephone Encounter (Signed)
Patient called to advise, medication for bladder (can't remember the name) is not working. Please call [phone].

## 2016-01-17 NOTE — Telephone Encounter (Signed)
Spoke to patient - she has been having increased difficulty with urinary incontinence. She has been prescribed oxybutynin 5mg  qd but, with her worsening problems, she has been doubling her dose and taking 10mg  daily over the last three days.  Says the increased dose has still not resolved her symptoms.

## 2016-01-17 NOTE — Telephone Encounter (Signed)
I called the patient. LVM. Will try again tomorrow.

## 2016-01-18 MED ORDER — TOLTERODINE TARTRATE 1 MG PO TABS: 1.0000 mg | ORAL_TABLET | Freq: Two times a day (BID) | ORAL | Status: DC

## 2016-01-18 NOTE — Telephone Encounter (Signed)
I called the patient. Oxybutynin is not working for urinary incontinence. She has been on Detrol in the past with good benefit. We will try this again. She will begin by taking 1 mg twice a day.

## 2016-01-18 NOTE — Addendum Note (Signed)
Addended by: Enedina Finner on: 01/18/2016 05:02 PM   Modules accepted: Orders, Medications

## 2016-01-18 NOTE — Telephone Encounter (Signed)
Patient returned Megan's call. Please call [phone].

## 2016-01-19 ENCOUNTER — Other Ambulatory Visit: Payer: Self-pay | Admitting: *Deleted

## 2016-01-19 NOTE — Telephone Encounter (Signed)
I see that prescription done by Butch Penny, np.

## 2016-01-24 NOTE — Telephone Encounter (Signed)
LMVM for pt that returning her call.  ? Need PA or not on formulary?

## 2016-01-24 NOTE — Telephone Encounter (Signed)
Patient called back to advise the other medication that was called in to help with urinary problem, insurance won't allow this medication. Please call [phone].

## 2016-01-24 NOTE — Telephone Encounter (Signed)
Spoke to pt and she is unsure of what pharmacy is asking about.  Will call and check.

## 2016-01-25 MED ORDER — SOLIFENACIN SUCCINATE 5 MG PO TABS: 5.0000 mg | ORAL_TABLET | Freq: Every day | ORAL | Status: DC

## 2016-01-25 NOTE — Telephone Encounter (Signed)
I spoke with pt.  She does not recall trying those meds.  She is willing to try vesicare.

## 2016-01-25 NOTE — Addendum Note (Signed)
Addended by: Enedina Finner on: 01/25/2016 09:18 PM   Modules accepted: Orders, Medications

## 2016-01-25 NOTE — Telephone Encounter (Signed)
Vesicare ordered 5 mg PO daily. Please make the patient aware.

## 2016-01-25 NOTE — Telephone Encounter (Addendum)
I called and spoke to pharmacist.  Need PA for detrol.   Other options are mebetriq and vesicare.  Do you want to change?

## 2016-01-25 NOTE — Telephone Encounter (Signed)
detrol has worked well for her in the past. She has tried Futures trader with minimal benefit. We can ask the patient if she would like to try vesicare before detrol.

## 2016-01-26 ENCOUNTER — Telehealth: Payer: Self-pay | Admitting: *Deleted

## 2016-01-26 NOTE — Telephone Encounter (Signed)
Pt [facility] [employer] form faxed on 01/26/16. [phone]

## 2016-01-26 NOTE — Telephone Encounter (Signed)
Called pt and relayed the medication vesicare is escribed to pharmacy [facility] [address].  VESICARE  po daily.  She will start this and let us know how this works for her.

## 2016-02-03 ENCOUNTER — Telehealth: Payer: Self-pay

## 2016-02-03 ENCOUNTER — Other Ambulatory Visit (INDEPENDENT_AMBULATORY_CARE_PROVIDER_SITE_OTHER): Payer: Self-pay

## 2016-02-03 DIAGNOSIS — R748 Abnormal levels of other serum enzymes: Secondary | ICD-10-CM

## 2016-02-03 DIAGNOSIS — Z0289 Encounter for other administrative examinations: Secondary | ICD-10-CM

## 2016-02-03 NOTE — Telephone Encounter (Signed)
I received a fax from [facility] requesting a refill on pt's gilenya to be taken 1 capsule daily. Upon reviewing the chart, it appears that pt should be taking the gilenya every other day. Furthermore, pt has not come in for her repeat liver enzymes, even though she was asked to come in and have her blood work on 01/06/2016.  Also, it appears that the last refill for gilenya was prescribed on 12/06/2015 and sent to [facility]. It most likely should have been sent to a specialty pharmacy, such as [facility], that actually distributes specialty drugs.  I spoke to pt. She says that she will come by today to have her blood drawn to check her liver enzymes. She reports that she has still been taking the gilenya every other day.  I will send this request to [facility], NP to review. Pt should have her labs drawn today. After the labs are checked, Aundra Millet, NP can decide to refill the gilenya to be taken once daily or every other day. The RX for gilenya should be sent to [facility].

## 2016-02-03 NOTE — Telephone Encounter (Signed)
Agree. Pending lab work we will decide dosing for Gilenya. I accidentally sent to [facility]- I will make sure its [facility].

## 2016-02-04 LAB — HEPATIC FUNCTION PANEL
ALBUMIN: 4.5 g/dL (ref 3.5–5.5)
ALK PHOS: 48 IU/L (ref 39–117)
ALT: 20 IU/L (ref 0–32)
AST: 27 IU/L (ref 0–40)
Bilirubin Total: <0.2 mg/dL (ref 0.0–1.2)
Bilirubin, Direct: 0.06 mg/dL (ref 0.00–0.40)
TOTAL PROTEIN: 6.9 g/dL (ref 6.0–8.5)

## 2016-02-04 MED ORDER — FINGOLIMOD HCL 0.5 MG PO CAPS: 0.5000 mg | ORAL_CAPSULE | Freq: Every day | ORAL | Status: DC

## 2016-02-04 NOTE — Telephone Encounter (Signed)
I spoke to pt and relayed that liver enzyme medications are normal.  She will start taking gilenya daily.  Will recheck liver enzymes in a month and see what they are.  Other options if elevated.  Pt verbalized understanding.

## 2016-02-04 NOTE — Telephone Encounter (Signed)
Patient's liver enzymes are normal. She will resume taking gilenya daily. We will recheck liver enzymes in 1 month. If they increase again then we will need to consider stopping Gilenya and starting another medication.

## 2016-02-07 ENCOUNTER — Other Ambulatory Visit: Payer: Self-pay | Admitting: *Deleted

## 2016-02-07 ENCOUNTER — Telehealth: Payer: Self-pay | Admitting: *Deleted

## 2016-02-07 MED ORDER — FINGOLIMOD HCL 0.5 MG PO CAPS: 0.5000 mg | ORAL_CAPSULE | Freq: Every day | ORAL | Status: DC

## 2016-02-07 NOTE — Telephone Encounter (Signed)
Rx resent, electronically, to [facility].

## 2016-02-07 NOTE — Telephone Encounter (Signed)
Dr Terrace Arabia- are you able to send rx gilenya? It looks like Tylene Fantasia, NP sent on 6/30 did not go through? Can you review and see if this is something that can be sent?  Called and spoke to [facility] specialty pharmacy. Spoke with Harriett Sine. Advised  I received a fax from [facility] requesting a refill on pt's gilenya to be taken 1 capsule daily. Advised one of our NP's sent rx on 02/04/16 but Harriett Sine stated they have not received prescription yet. Advised I will send to work in provider who is covering for Dr Vickey Huger and see if they can send in new request again. She verbalized understanding.

## 2016-02-07 NOTE — Telephone Encounter (Signed)
error 

## 2016-02-08 ENCOUNTER — Encounter (HOSPITAL_COMMUNITY): Payer: Self-pay | Admitting: Emergency Medicine

## 2016-02-08 ENCOUNTER — Telehealth: Payer: Self-pay | Admitting: Neurology

## 2016-02-08 ENCOUNTER — Emergency Department (HOSPITAL_COMMUNITY)
Admission: EM | Admit: 2016-02-08 | Discharge: 2016-02-08 | Disposition: A | Discharge status: Home / Self Care | Payer: Medicare Other | Attending: Emergency Medicine | Admitting: Emergency Medicine

## 2016-02-08 DIAGNOSIS — Z79899 Other long term (current) drug therapy: Secondary | ICD-10-CM | POA: Diagnosis not present

## 2016-02-08 DIAGNOSIS — G35 Multiple sclerosis: Secondary | ICD-10-CM | POA: Insufficient documentation

## 2016-02-08 DIAGNOSIS — R4781 Slurred speech: Secondary | ICD-10-CM | POA: Insufficient documentation

## 2016-02-08 LAB — CBC WITH DIFFERENTIAL/PLATELET
Basophils Absolute: 0 10*3/uL (ref 0.0–0.1)
Basophils Relative: 0 %
EOS ABS: 0.1 10*3/uL (ref 0.0–0.7)
Eosinophils Relative: 2 %
HCT: 38.9 % (ref 36.0–46.0)
HEMOGLOBIN: 12.7 g/dL (ref 12.0–15.0)
LYMPHS ABS: 0.8 10*3/uL (ref 0.7–4.0)
Lymphocytes Relative: 22 %
MCH: 30.8 pg (ref 26.0–34.0)
MCHC: 32.6 g/dL (ref 30.0–36.0)
MCV: 94.4 fL (ref 78.0–100.0)
MONO ABS: 0.3 10*3/uL (ref 0.1–1.0)
MONOS PCT: 7 %
NEUTROS PCT: 69 %
Neutro Abs: 2.6 10*3/uL (ref 1.7–7.7)
Platelets: 205 10*3/uL (ref 150–400)
RBC: 4.12 MIL/uL (ref 3.87–5.11)
RDW: 13 % (ref 11.5–15.5)
WBC: 3.8 10*3/uL — ABNORMAL LOW (ref 4.0–10.5)

## 2016-02-08 LAB — URINALYSIS, ROUTINE W REFLEX MICROSCOPIC
Bilirubin Urine: NEGATIVE
GLUCOSE, UA: NEGATIVE mg/dL
Hgb urine dipstick: NEGATIVE
KETONES UR: NEGATIVE mg/dL
NITRITE: NEGATIVE
PROTEIN: 30 mg/dL — AB
Specific Gravity, Urine: 1.022 (ref 1.005–1.030)
pH: 7 (ref 5.0–8.0)

## 2016-02-08 LAB — URINE MICROSCOPIC-ADD ON

## 2016-02-08 LAB — COMPREHENSIVE METABOLIC PANEL
ALK PHOS: 42 U/L (ref 38–126)
ALT: 31 U/L (ref 14–54)
ANION GAP: 5 (ref 5–15)
AST: 25 U/L (ref 15–41)
Albumin: 3.9 g/dL (ref 3.5–5.0)
BILIRUBIN TOTAL: 0.5 mg/dL (ref 0.3–1.2)
BUN: 13 mg/dL (ref 6–20)
CHLORIDE: 111 mmol/L (ref 101–111)
CO2: 24 mmol/L (ref 22–32)
Calcium: 9.5 mg/dL (ref 8.9–10.3)
Creatinine, Ser: 1.13 mg/dL — ABNORMAL HIGH (ref 0.44–1.00)
GFR calc non Af Amer: 57 mL/min — ABNORMAL LOW (ref >60)
Glucose, Bld: 80 mg/dL (ref 65–99)
POTASSIUM: 3.5 mmol/L (ref 3.5–5.1)
SODIUM: 140 mmol/L (ref 135–145)
Total Protein: 6.8 g/dL (ref 6.5–8.1)

## 2016-02-08 LAB — I-STAT BETA HCG BLOOD, ED (MC, WL, AP ONLY)

## 2016-02-08 MED ORDER — PREDNISONE 50 MG PO TABS: 600.0000 mg | ORAL_TABLET | Freq: Every day | ORAL | Status: AC

## 2016-02-08 MED ORDER — SODIUM CHLORIDE 0.9 % IV SOLN
1000.0000 mg | Freq: Once | INTRAVENOUS | Status: AC
  Administered 2016-02-08: 1000 mg via INTRAVENOUS
  Filled 2016-02-08 (×2): qty 8

## 2016-02-08 NOTE — Telephone Encounter (Signed)
Patient called back answering service and left another number, I called it was a wrong number. Have tried multiple numbers at this point multiple times and cant get through. Will let answering service know if she calls back to inform her she will have to go to the emergency room if she thinks she is having an MS exacerbation at this point I have made multiple efforts to speak with her myself and they have been unsuccessful. Marland Kitchen

## 2016-02-08 NOTE — ED Provider Notes (Signed)
CSN: [id number]     Arrival date & time 02/08/16  1740 History   First MD Initiated Contact with Patient 02/08/16 1756     Chief Complaint  Patient presents with  . Aphasia  . MS flare      (Consider location/radiation/quality/duration/timing/severity/associated sxs/prior Treatment) HPI Comments: 45 year old female with history of MS presents for difficulty with her speech. The patient states that over the last week or 2 she has only gotten about 6 hours of sleep at night which is very unusual for her. She says over this time she has been feeling very fatigued and over the last 3 days or so she has developed slurred speech and issues with her balance when she walks. She says that she falls frequently and has fallen twice within the last several days. She reports these symptoms are very typical of her previous MS flares. She attempted to reach her neurologist office but was unable to. It looks like per notes that neurology did try to call her back but they were unsuccessful. The patient used to receive IV steroid infusions in the past when she was living in [place] and [place].   Past Medical History  Diagnosis Date  . MS (multiple sclerosis) (HCC) 1992    Dr. Sandria Manly  . Depression   . Chronic daily headache 2003    [place]  . Multiple sclerosis (HCC) 11/21/2013  . Neurogenic bladder 11/21/2013  . Abnormality of gait 11/25/2014   Past Surgical History  Procedure Laterality Date  . Tubal ligation    . Ablation colpoclesis  2004  . Cesarean section     Family History  Problem Relation Age of Onset  . Heart failure Father   . Multiple sclerosis Sister   . Cancer Neg Hx   . Diabetes Neg Hx   . Hyperlipidemia Neg Hx   . Hypertension Neg Hx   . Kidney disease Neg Hx   . Stroke Neg Hx    Social History  Substance Use Topics  . Smoking status: Never Smoker   . Smokeless tobacco: Never Used  . Alcohol Use: 1.2 oz/week    2 Standard drinks or equivalent per week   OB History    Gravida  Para Term Preterm AB TAB SAB Ectopic Multiple Living   3 2 2  1  1   2      Review of Systems  Constitutional: Negative for fever, chills and fatigue.  HENT: Negative for congestion, postnasal drip, rhinorrhea and sinus pressure.   Eyes: Negative for visual disturbance.  Respiratory: Negative for cough, chest tightness and shortness of breath.   Cardiovascular: Negative for chest pain, palpitations and leg swelling.  Gastrointestinal: Negative for nausea, vomiting, abdominal pain and diarrhea.  Genitourinary: Positive for urgency. Negative for dysuria, hematuria and flank pain.       Has urinary incontinence from time to time  Musculoskeletal: Negative for myalgias and back pain.  Skin: Negative for rash.  Neurological: Positive for speech difficulty. Negative for dizziness, seizures, syncope, facial asymmetry, weakness, light-headedness and numbness.  Hematological: Does not bruise/bleed easily.      Allergies  Review of patient's allergies indicates no known allergies.  Home Medications   Prior to Admission medications   Medication Sig Start Date End Date Taking? Authorizing Provider  amitriptyline (ELAVIL) 25 MG tablet Take 2 tablets (50 mg total) by mouth at bedtime. 06/07/15  Yes Anson Fret, MD  amLODipine (NORVASC) 5 MG tablet Take 5 mg by mouth daily. 11/14/13  Yes  Historical Provider, MD  benazepril (LOTENSIN) 20 MG tablet Take 20 mg by mouth daily. 11/14/13  Yes Historical Provider, MD  Fingolimod HCl (GILENYA) 0.5 MG CAPS Take 1 capsule (0.5 mg total) by mouth daily. 02/07/16  Yes Levert Feinstein, MD  solifenacin (VESICARE) 5 MG tablet Take 1 tablet (5 mg total) by mouth daily. 01/25/16  Yes Butch Penny, NP  SUMAtriptan (IMITREX) 100 MG tablet Take 1 tablet (100 mg total) by mouth 2 (two) times daily as needed for migraine. 09/01/15  Yes York Spaniel, MD  topiramate (TOPAMAX) 50 MG tablet Take 4 tablets (200 mg total) by mouth at bedtime. 12/01/15  Yes Butch Penny, NP   traZODone (DESYREL) 100 MG tablet  05/27/15  Yes Historical Provider, MD  valACYclovir (VALTREX) 1000 MG tablet  11/02/15  Yes Historical Provider, MD  zaleplon (SONATA) 10 MG capsule Take 1 capsule (10 mg total) by mouth at bedtime as needed for sleep. 06/28/15  Yes Melvyn Novas, MD  acyclovir (ZOVIRAX) 400 MG tablet Take 1 tablet (400 mg total) by mouth 1 day or 1 dose. 09/15/13   Elson Areas, PA-C  phentermine 37.5 MG capsule Take 1 capsule (37.5 mg total) by mouth every morning. 10/06/15   Tharon Aquas, PA  predniSONE (DELTASONE) 50 MG tablet Take 12 tablets (600 mg total) by mouth daily. Take 600 mg on Wednesday (July 5) and another 600 mg on Thursday (July 6) 02/09/16 02/10/16  Leta Baptist, MD   BP 137/78 mmHg  Pulse 60  Temp(Src) 97.9 F (36.6 C) (Oral)  Resp 17  SpO2 98% Physical Exam  Constitutional: She is oriented to person, place, and time. She appears well-developed and well-nourished. No distress.  HENT:  Head: Normocephalic and atraumatic.  Right Ear: External ear normal.  Left Ear: External ear normal.  Nose: Nose normal.  Mouth/Throat: Oropharynx is clear and moist. No oropharyngeal exudate.  Eyes: EOM are normal. Pupils are equal, round, and reactive to light.  Neck: Normal range of motion. Neck supple.  Cardiovascular: Normal rate, regular rhythm, normal heart sounds and intact distal pulses.   No murmur heard. Pulmonary/Chest: Effort normal. No respiratory distress. She has no wheezes. She has no rales.  Abdominal: Soft. She exhibits no distension. There is no tenderness.  Musculoskeletal: Normal range of motion. She exhibits no edema or tenderness.  Neurological: She is alert and oriented to person, place, and time. She has normal strength. No cranial nerve deficit.  Reports decreased sensation in the left side of the face, left arm, and left leg.  Normal finger to nose exam bilaterally.  Normal strength.  Skin: Skin is warm and dry. No rash noted. She is not  diaphoretic.  Vitals reviewed.   ED Course  Procedures (including critical care time) Labs Review Labs Reviewed  CBC WITH DIFFERENTIAL/PLATELET - Abnormal; Notable for the following:    WBC 3.8 (*)    All other components within normal limits  COMPREHENSIVE METABOLIC PANEL - Abnormal; Notable for the following:    Creatinine, Ser 1.13 (*)    GFR calc non Af Amer 57 (*)    All other components within normal limits  URINALYSIS, ROUTINE W REFLEX MICROSCOPIC (NOT AT [facility]) - Abnormal; Notable for the following:    APPearance TURBID (*)    Protein, ur 30 (*)    Leukocytes, UA SMALL (*)    All other components within normal limits  URINE MICROSCOPIC-ADD ON - Abnormal; Notable for the following:    Squamous Epithelial /  LPF TOO NUMEROUS TO COUNT (*)    Bacteria, UA MANY (*)    All other components within normal limits  URINE CULTURE  I-STAT BETA HCG BLOOD, ED ([facility], [facility], [facility] ONLY)    Imaging Review No results found. I have personally reviewed and evaluated these images and lab results as part of my medical decision-making.   EKG Interpretation None      MDM  Patient was seen and evaluated at bedside. Patient with symptoms consistent with an MS flare. Laboratory studies unremarkable for patient. UA contaminated with mild signs of infection and so culture was sent. Case was discussed with Dr. Amada Jupiter from neurology who recommended giving the patient a gram of Solu-Medrol and then discharging her on 625 mg of prednisone for tomorrow and the following day and follow up outpatient with her neurologist. Patient ambulated to the bathroom without difficulty and felt comfortable with plan for discharge. She was given her initial dose of Solu-Medrol here and provided with a prescription for prednisone. She was discharged home in stable condition with instruction of follow-up with her neurologist. Final diagnoses:  Multiple sclerosis (HCC)  Slurred speech    1. MS flare 2. Slurred  speech    Leta Baptist, MD 02/09/16 [phone]

## 2016-02-08 NOTE — ED Notes (Signed)
Pt here with MS flare due to decreased sleep recently; pt with slurred speech x 3 days and decreased balance with fall x 2; pt sts typical of MS

## 2016-02-08 NOTE — Telephone Encounter (Signed)
On call doctor paged for "MS flare". Tried calling back multiple times, message says the person you are calling cannot accept calls at this time.

## 2016-02-08 NOTE — Discharge Instructions (Signed)
You were seen and evaluated today for your MS flare. You were given a large dose of initial steroids through your IV. Please follow-up with your neurologist. It was recommended by the neurologist on-call for the hospital that you take 600 mg of prednisone tomorrow as well as the next day. If you would like to discuss other treatment options please do so with your neurology office tomorrow first thing in the morning.  Multiple Sclerosis Multiple sclerosis (MS) is a disease of the central nervous system. It leads to the loss of the insulating covering of the nerves (myelin sheath) of your brain. When this happens, brain signals do not get sent properly or may not get sent at all. The age of onset of MS varies.  CAUSES The cause of MS is unknown. However, it is more common in the [place] than in the [place]. RISK FACTORS There is a higher number of women with MS than men. MS is not an illness that is passed down to you from your family members (inherited). However, your risk of MS is higher if you have a relative with MS. SIGNS AND SYMPTOMS  The symptoms of MS occur in episodes or attacks. These attacks may last weeks to months. There may be long periods of almost no symptoms between attacks. The symptoms of MS vary. This is because of the many different ways it affects the central nervous system. The main symptoms of MS include:  Vision problems and eye pain.  Numbness.  Weakness.  Inability to move your arms, hands, feet, or legs (paralysis).  Balance problems.  Tremors. DIAGNOSIS  Your health care provider can diagnose MS with the help of imaging exams and lab tests. These may include specialized X-ray exams and spinal fluid tests. The best imaging exam to confirm a diagnosis of MS is an MRI. TREATMENT  There is no known cure for MS, but there are medicines that can decrease the number and frequency of attacks. Steroids are often used for short-term relief.  Physical and occupational therapy may also help. There are also many new alternative or complementary treatments available to help control the symptoms of MS. Ask your health care provider if any of these other options are right for you. HOME CARE INSTRUCTIONS   Take medicines as directed by your health care provider.  Exercise as directed by your health care provider. SEEK MEDICAL CARE IF: You begin to feel depressed. SEEK IMMEDIATE MEDICAL CARE IF:  You develop paralysis.  You have problems with bladder, bowel, or sexual function.  You develop mental changes, such as forgetfulness or mood swings.  You have a period of uncontrolled movements (seizure).   This information is not intended to replace advice given to you by your health care provider. Make sure you discuss any questions you have with your health care provider.   Document Released: 07/21/2000 Document Revised: 07/29/2013 Document Reviewed: 03/31/2013 Elsevier Interactive Patient Education [employer]

## 2016-02-09 ENCOUNTER — Ambulatory Visit (INDEPENDENT_AMBULATORY_CARE_PROVIDER_SITE_OTHER): Payer: Medicare Other | Admitting: Neurology

## 2016-02-09 ENCOUNTER — Encounter: Payer: Self-pay | Admitting: Neurology

## 2016-02-09 VITALS — BP 128/85 | HR 74 | Ht 65.0 in | Wt 208.0 lb

## 2016-02-09 DIAGNOSIS — N319 Neuromuscular dysfunction of bladder, unspecified: Secondary | ICD-10-CM

## 2016-02-09 DIAGNOSIS — R269 Unspecified abnormalities of gait and mobility: Secondary | ICD-10-CM

## 2016-02-09 DIAGNOSIS — G35 Multiple sclerosis: Secondary | ICD-10-CM | POA: Diagnosis not present

## 2016-02-09 MED ORDER — ALPRAZOLAM 0.5 MG PO TABS: ORAL_TABLET | ORAL | Status: DC

## 2016-02-09 NOTE — Telephone Encounter (Signed)
Talked to pt. Work-in appt scheduled this morning.

## 2016-02-09 NOTE — Progress Notes (Signed)
Reason for visit: Multiple sclerosis  Connie Robertson is an 45 y.o. 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 The patient has had a neurogenic bladder as well. Over the last 10 days she has been driving her daughter to and from [place], getting up early in the morning. She reports some increasing fatigue with this activity. Within the last several days she has noted some worsening of bladder control, she is on Vesicare taking 5 mg twice daily without benefit. She has been seen by urology in the past, but not recently. She remains on Gilenya for her multiple sclerosis, tolerating the drug well. The patient has been having increased fatigue, gait instability, slurred speech, and some increase in tingling below the knees bilaterally. The patient went to the emergency room yesterday, she got IV Solu-Medrol. She was given a prescription for prednisone, but she did not pick up the prescription. She indicates that in the past, IV Solu-Medrol has worked better for her. She last had MRI evaluation of the brain one year ago in April 2016.  Past Medical History  Diagnosis Date  . MS (multiple sclerosis) (HCC) 1992    Dr. Sandria Manly  . Depression   . Chronic daily headache 2003    [facility]  . Multiple sclerosis (HCC) 11/21/2013  . Neurogenic bladder 11/21/2013  . Abnormality of gait 11/25/2014    Past Surgical History  Procedure Laterality Date  . Tubal ligation    . Ablation colpoclesis  2004  . Cesarean section      Family History  Problem Relation Age of Onset  . Heart failure Father   . Multiple sclerosis Sister   . Cancer Neg Hx   . Diabetes Neg Hx   . Hyperlipidemia Neg Hx   . Hypertension Neg Hx   . Kidney disease Neg Hx   . Stroke Neg Hx     Social history:  reports that she has never smoked. She has never used smokeless tobacco. She reports that she drinks  about 1.2 oz of alcohol per week. She reports that she does not use illicit drugs.   No Known Allergies  Medications:  Prior to Admission medications   Medication Sig Start Date End Date Taking? Authorizing Provider  acyclovir (ZOVIRAX) 400 MG tablet Take 1 tablet (400 mg total) by mouth 1 day or 1 dose. 09/15/13  Yes Lonia Skinner Sofia, PA-C  amitriptyline (ELAVIL) 25 MG tablet Take 2 tablets (50 mg total) by mouth at bedtime. 06/07/15  Yes Anson Fret, MD  amLODipine (NORVASC) 5 MG tablet Take 5 mg by mouth daily. 11/14/13  Yes Historical Provider, MD  benazepril (LOTENSIN) 20 MG tablet Take 20 mg by mouth daily. 11/14/13  Yes Historical Provider, MD  Fingolimod HCl (GILENYA) 0.5 MG CAPS Take 1 capsule (0.5 mg total) by mouth daily. 02/07/16  Yes Levert Feinstein, MD  phentermine 37.5 MG capsule Take 1 capsule (37.5 mg total) by mouth every morning. 10/06/15  Yes Tharon Aquas, PA  predniSONE (DELTASONE) 50 MG tablet Take 12 tablets (600 mg total) by mouth daily. Take 600 mg on Wednesday (July 5) and another 600 mg on Thursday (July 6) 02/09/16 02/10/16 Yes Leta Baptist, MD  solifenacin (VESICARE) 5 MG tablet Take 1 tablet (5 mg total) by mouth daily. 01/25/16  Yes Butch Penny, NP  SUMAtriptan (IMITREX) 100 MG tablet Take 1 tablet (100 mg total)  by mouth 2 (two) times daily as needed for migraine. 09/01/15  Yes York Spaniel, MD  topiramate (TOPAMAX) 50 MG tablet Take 4 tablets (200 mg total) by mouth at bedtime. 12/01/15  Yes Butch Penny, NP  traZODone (DESYREL) 100 MG tablet  05/27/15  Yes Historical Provider, MD  valACYclovir (VALTREX) 1000 MG tablet  11/02/15  Yes Historical Provider, MD  zaleplon (SONATA) 10 MG capsule Take 1 capsule (10 mg total) by mouth at bedtime as needed for sleep. 06/28/15  Yes Melvyn Novas, MD    ROS:  Out of a complete 14 system review of symptoms, the patient complains only of the following symptoms, and all other reviewed systems are negative.  Gait  disorder Slurred speech Fatigue  Blood pressure 128/85, pulse 74, height 5\' 5"  (1.651 m), weight 208 lb (94.348 kg).  Physical Exam  General: The patient is alert and cooperative at the time of the examination. The patient is moderately obese.  Skin: No significant peripheral edema is noted.   Neurologic Exam  Mental status: The patient is alert and oriented x 3 at the time of the examination. The patient has apparent normal recent and remote memory, with an apparently normal attention span and concentration ability.   Cranial nerves: Facial symmetry is present. Speech is normal, no aphasia or dysarthria is noted. Extraocular movements are full. Visual fields are full. Pupils are equal, round, and reactive to light. Discs are flat bilaterally.  Motor: The patient has good strength in all 4 extremities.  Sensory examination: Soft touch sensation is symmetric on the face, arms, and legs.  Coordination: The patient has good finger-nose-finger and heel-to-shin bilaterally.  Gait and station: The patient has a wide-based gait, slightly unsteady. The patient is able to walk independently. Some hesitation is seen with turns. The tandem gait is unsteady.  Reflexes: Deep tendon reflexes are symmetric.   Assessment/Plan:   1. Multiple sclerosis  2. Gait disturbance  3. Neurogenic bladder  The patient is having increased symptoms of fatigue, gait disturbance, tingling in the legs, and slurred speech. She will be continued on Solu-Medrol with 500 mg daily for the next 2 days. She will be set up for a urology referral for her neurogenic bladder symptoms. She has not gained much benefit with Vesicare. She will follow-up in 4 months. If the walking does not improve, physical therapy can be done. She will be set up for MRI of the brain. Blood work was recently done in the hospital. This appears to be unremarkable.  Marlan Palau MD 02/09/2016 4:42 PM  [facility] [address] [place], [place] [address]  Phone [phone] Fax [phone]

## 2016-02-09 NOTE — Telephone Encounter (Signed)
I called the patient, she apparently did go to the emergency room yesterday the note indicates that she was having some slurred speech, difficulty walking, urinary incontinence. The patient herself appears to be somewhat confused, difficulty giving an accurate history. She got a gram of Solu-Medrol yesterday, was placed on a prednisone Dosepak. I will try to get her in the office for an evaluation. The patient is on Gilenya.

## 2016-02-11 LAB — URINE CULTURE: Culture: >=100000 — AB

## 2016-02-12 ENCOUNTER — Telehealth (HOSPITAL_BASED_OUTPATIENT_CLINIC_OR_DEPARTMENT_OTHER): Payer: Self-pay

## 2016-02-12 NOTE — Telephone Encounter (Signed)
Post ED Visit - Positive Culture Follow-up: Successful Patient Follow-Up  Culture assessed and recommendations reviewed by: []  Enzo Bi, Pharm.D. []  Celedonio Miyamoto, Pharm.D., BCPS []  Garvin Fila, Pharm.D. []  Georgina Pillion, Pharm.D., BCPS []  Strawberry, [address].D., BCPS, AAHIVP []  Estella Husk, Pharm.D., BCPS, AAHIVP []  Tennis Must, Pharm.D. []  Rob Tumacacori-Carmen, [address]DNovella Rob Pharm D Positive urine culture  [x]  Patient discharged without antimicrobial prescription and treatment is now indicated []  Organism is resistant to prescribed ED discharge antimicrobial []  Patient with positive blood cultures  Changes discussed with ED provider: Allen Derry Robertson New antibiotic prescription Cephalexin Called to [facility] [phone]  Contacted patient, date 02/12/16, time 1110   [name], [name] 02/12/2016, 11:12 AM

## 2016-02-21 NOTE — Telephone Encounter (Signed)
I consulted with Dr. Epimenio Foot He recommended 2 additional days of IV Solu-Medrol 500 mg. Connie Robertson can you set this up. Thanks.

## 2016-02-21 NOTE — Telephone Encounter (Addendum)
Called and spoke to pt. Pt was seen in ER on 02/08/16 and received 1st dose of IV Solumedrol 500 mg. Was prescribed PO Prednisone but did not take it. After seeing Dr. Anne Hahn on 02/09/16, she received 2 additional days of IV Solu-medrol. Still has not taken PO steroids, only IV. Continues to "not feel well, off-balance," saying that she almost fell off the bike when at the gym yesterday. She talks w/ understandable speech during phone call but feels that she's sometimes in a fog. Still on Gilenya and thought that she would feel better after steroids. MRI has not yet been scheduled per pt.

## 2016-02-21 NOTE — Telephone Encounter (Signed)
Notified [facility]. Left mssg for pt to let her know that infusion nurse would call her to schedule additional IV Solumedrol.

## 2016-02-21 NOTE — Telephone Encounter (Signed)
Patient called, states she is "still not feeling like herself, still feeling pretty bad,had steroid infusion injections, still off balance with walking, speech still hasn't come back to where she has no issues". Please call [phone].

## 2016-02-21 NOTE — Telephone Encounter (Signed)
Looks like patient has not responded to steroids and needs to be seen in the office. Arrange for nurse practitioner or Dr Lucia Gaskins. to see next available

## 2016-02-22 ENCOUNTER — Telehealth: Payer: Self-pay | Admitting: Adult Health

## 2016-02-22 ENCOUNTER — Other Ambulatory Visit: Payer: Self-pay

## 2016-02-22 MED ORDER — FINGOLIMOD HCL 0.5 MG PO CAPS: 0.5000 mg | ORAL_CAPSULE | Freq: Every day | ORAL | Status: DC

## 2016-02-22 NOTE — Telephone Encounter (Signed)
Spoke to [facility] but there is no availability in infusion schedule this week. Called pt back and left another mssg notifying her that I would discuss further w/ Dr. Anne Hahn when he returns in the morning. She may call back w/ additional questions/conerns.

## 2016-02-22 NOTE — Telephone Encounter (Signed)
Returned pt TC. Work-in appt scheduled for tomorrow morning.

## 2016-02-22 NOTE — Telephone Encounter (Signed)
Error

## 2016-02-22 NOTE — Telephone Encounter (Signed)
Spoke to pt earlier today. Please see telephone note dated 02/08/16. Work-in appt already scheduled for tomorrow morning.

## 2016-02-22 NOTE — Telephone Encounter (Signed)
I received a fax from [facility] needed a refill on Gilenya but it was refilled on 02/07/2016 by Dr. Terrace Arabia. I called [facility] and spoke to [facility], pharmacist. He reports to me that they did not receive the RX from 02/07/2016. Will refill.

## 2016-02-22 NOTE — Telephone Encounter (Signed)
Patient called back to follow up on message left yesterday, asked patient if she had a voicemail from nurse yesterday evening? Patient states she didn't get a message. Phone call dropped.

## 2016-02-22 NOTE — Telephone Encounter (Signed)
Pt called in and states she was speaking with someone with our office but does not know who or what about. Pt states she is still feeling week even after steroid infusion. While speak with the pt i noticed she was having a hard time thinking of words and expressing her self verbally. I asked the pt if it was new or an on going issue. Pt said it is new. She is not sure if she has told her nurse or the physician here. Please call and advise [phone]

## 2016-02-22 NOTE — Telephone Encounter (Signed)
I will try to work in this patient this week.

## 2016-02-23 ENCOUNTER — Encounter: Payer: Self-pay | Admitting: Neurology

## 2016-02-23 ENCOUNTER — Ambulatory Visit (INDEPENDENT_AMBULATORY_CARE_PROVIDER_SITE_OTHER): Payer: Medicare Other | Admitting: Neurology

## 2016-02-23 VITALS — BP 123/85 | HR 62 | Ht 65.0 in | Wt 207.8 lb

## 2016-02-23 DIAGNOSIS — R269 Unspecified abnormalities of gait and mobility: Secondary | ICD-10-CM

## 2016-02-23 DIAGNOSIS — G35 Multiple sclerosis: Secondary | ICD-10-CM | POA: Diagnosis not present

## 2016-02-23 DIAGNOSIS — N319 Neuromuscular dysfunction of bladder, unspecified: Secondary | ICD-10-CM

## 2016-02-23 MED ORDER — PREDNISONE 10 MG PO TABS: ORAL_TABLET | ORAL | Status: DC

## 2016-02-23 NOTE — Patient Instructions (Signed)
Fall Prevention in the Home  Falls can cause injuries and can affect people from all age groups. There are many simple things that you can do to make your home safe and to help prevent falls. WHAT CAN I DO ON THE OUTSIDE OF MY HOME?  Regularly repair the edges of walkways and driveways and fix any cracks.  Remove high doorway thresholds.  Trim any shrubbery on the main path into your home.  Use bright outdoor lighting.  Clear walkways of debris and clutter, including tools and rocks.  Regularly check that handrails are securely fastened and in good repair. Both sides of any steps should have handrails.  Install guardrails along the edges of any raised decks or porches.  Have leaves, snow, and ice cleared regularly.  Use sand or salt on walkways during winter months.  In the garage, clean up any spills right away, including grease or oil spills. WHAT CAN I DO IN THE BATHROOM?  Use night lights.  Install grab bars by the toilet and in the tub and shower. Do not use towel bars as grab bars.  Use non-skid mats or decals on the floor of the tub or shower.  If you need to sit down while you are in the shower, use a plastic, non-slip stool..  Keep the floor dry. Immediately clean up any water that spills on the floor.  Remove soap buildup in the tub or shower on a regular basis.  Attach bath mats securely with double-sided non-slip rug tape.  Remove throw rugs and other tripping hazards from the floor. WHAT CAN I DO IN THE BEDROOM?  Use night lights.  Make sure that a bedside light is easy to reach.  Do not use oversized bedding that drapes onto the floor.  Have a firm chair that has side arms to use for getting dressed.  Remove throw rugs and other tripping hazards from the floor. WHAT CAN I DO IN THE KITCHEN?   Clean up any spills right away.  Avoid walking on wet floors.  Place frequently used items in easy-to-reach places.  If you need to reach for something  above you, use a sturdy step stool that has a grab bar.  Keep electrical cables out of the way.  Do not use floor polish or wax that makes floors slippery. If you have to use wax, make sure that it is non-skid floor wax.  Remove throw rugs and other tripping hazards from the floor. WHAT CAN I DO IN THE STAIRWAYS?  Do not leave any items on the stairs.  Make sure that there are handrails on both sides of the stairs. Fix handrails that are broken or loose. Make sure that handrails are as long as the stairways.  Check any carpeting to make sure that it is firmly attached to the stairs. Fix any carpet that is loose or worn.  Avoid having throw rugs at the top or bottom of stairways, or secure the rugs with carpet tape to prevent them from moving.  Make sure that you have a light switch at the top of the stairs and the bottom of the stairs. If you do not have them, have them installed. WHAT ARE SOME OTHER FALL PREVENTION TIPS?  Wear closed-toe shoes that fit well and support your feet. Wear shoes that have rubber soles or low heels.  When you use a stepladder, make sure that it is completely opened and that the sides are firmly locked. Have someone hold the ladder while you   are using it. Do not climb a closed stepladder.  Add color or contrast paint or tape to grab bars and handrails in your home. Place contrasting color strips on the first and last steps.  Use mobility aids as needed, such as canes, walkers, scooters, and crutches.  Turn on lights if it is dark. Replace any light bulbs that burn out.  Set up furniture so that there are clear paths. Keep the furniture in the same spot.  Fix any uneven floor surfaces.  Choose a carpet design that does not hide the edge of steps of a stairway.  Be aware of any and all pets.  Review your medicines with your healthcare provider. Some medicines can cause dizziness or changes in blood pressure, which increase your risk of falling. Talk  with your health care provider about other ways that you can decrease your risk of falls. This may include working with a physical therapist or trainer to improve your strength, balance, and endurance.   This information is not intended to replace advice given to you by your health care provider. Make sure you discuss any questions you have with your health care provider.   Document Released: 07/14/2002 Document Revised: 12/08/2014 Document Reviewed: 08/28/2014 Elsevier Interactive Patient Education 2016 [employer]  

## 2016-02-23 NOTE — Progress Notes (Signed)
Reason for visit: multiple sclerosis  Connie Robertson is an 44 y.o. female  History of present illness:  Connie Robertson is a 45 year old right-handed black female with a history of multiple sclerosis associated with a gait disorder and cognitive decline. The patient has reported a several week history of increasing fatigue, worsening gait, slurring of speech, tingling in the legs. The patient was last seen on July 5, she had gone to the emergency room the day before and gotten IV Solu-Medrol. The patient got a dose of Solu-Medrol in our office, she was to return the next day but did not. She never went on the prednisone taper. The patient is confused about what medication she is taking, she has gone off of her blood pressure medications. She indicates that she has had persistent symptoms, she has not gotten better with the Solu-Medrol. The patient continues to have ongoing fatigue, she has had urgency of the bladder with occasional incontinence. She reports no falls, and no changes in vision have been noted. The patient returns this office for another urgent work in evaluation.  Past Medical History  Diagnosis Date  . MS (multiple sclerosis) (HCC) 1992    Dr. Sandria Manly  . Depression   . Chronic daily headache 2003    [facility]  . Multiple sclerosis (HCC) 11/21/2013  . Neurogenic bladder 11/21/2013  . Abnormality of gait 11/25/2014    Past Surgical History  Procedure Laterality Date  . Tubal ligation    . Ablation colpoclesis  2004  . Cesarean section      Family History  Problem Relation Age of Onset  . Heart failure Father   . Multiple sclerosis Sister   . Cancer Neg Hx   . Diabetes Neg Hx   . Hyperlipidemia Neg Hx   . Hypertension Neg Hx   . Kidney disease Neg Hx   . Stroke Neg Hx     Social history:  reports that she has never smoked. She has never used smokeless tobacco. She reports that she drinks about 1.2 oz of alcohol per week. She reports that she does not use illicit  drugs.   No Known Allergies  Medications:  Prior to Admission medications   Medication Sig Start Date End Date Taking? Authorizing Provider  acyclovir (ZOVIRAX) 400 MG tablet Take 1 tablet (400 mg total) by mouth 1 day or 1 dose. 09/15/13  Yes Lonia Skinner Sofia, PA-C  ALPRAZolam Prudy Feeler) 0.5 MG tablet Take 2 tablets approximately 45 minutes prior to the MRI study, take a third tablet if needed. 02/09/16  Yes York Spaniel, MD  amitriptyline (ELAVIL) 25 MG tablet Take 2 tablets (50 mg total) by mouth at bedtime. 06/07/15  Yes Anson Fret, MD  amLODipine (NORVASC) 5 MG tablet Take 5 mg by mouth daily. 11/14/13  Yes Historical Provider, MD  benazepril (LOTENSIN) 20 MG tablet Take 20 mg by mouth daily. 11/14/13  Yes Historical Provider, MD  Fingolimod HCl (GILENYA) 0.5 MG CAPS Take 1 capsule (0.5 mg total) by mouth daily. 02/22/16  Yes Porfirio Mylar Dohmeier, MD  phentermine 37.5 MG capsule Take 1 capsule (37.5 mg total) by mouth every morning. 10/06/15  Yes Tharon Aquas, PA  solifenacin (VESICARE) 5 MG tablet Take 1 tablet (5 mg total) by mouth daily. 01/25/16  Yes Butch Penny, NP  SUMAtriptan (IMITREX) 100 MG tablet Take 1 tablet (100 mg total) by mouth 2 (two) times daily as needed for migraine. 09/01/15  Yes York Spaniel, MD  topiramate (TOPAMAX)  50 MG tablet Take 4 tablets (200 mg total) by mouth at bedtime. 12/01/15  Yes Butch Penny, NP  traZODone (DESYREL) 100 MG tablet  05/27/15  Yes Historical Provider, MD  valACYclovir (VALTREX) 1000 MG tablet  11/02/15  Yes Historical Provider, MD  zaleplon (SONATA) 10 MG capsule Take 1 capsule (10 mg total) by mouth at bedtime as needed for sleep. 06/28/15  Yes Melvyn Novas, MD    ROS:  Out of a complete 14 system review of symptoms, the patient complains only of the following symptoms, and all other reviewed systems are negative.  Gait disturbance Sensory alteration  Blood pressure 123/85, pulse 62, height  (1.651 m), weight 207 lb 12 oz  (94.235 kg).  Physical Exam  General: The patient is alert and cooperative at the time of the examination. The patient is moderately obese.  Skin: No significant peripheral edema is noted.   Neurologic Exam  Mental status: The patient is alert and oriented x 3 at the time of the examination. The patient has apparent normal recent and remote memory, with an apparently normal attention span and concentration ability.   Cranial nerves: Facial symmetry is present. Speech is minimally dysarthric, not aphasic. Extraocular movements are full. Visual fields are full. Pupils are equal, round, and reactive to light. Discs are flat bilaterally.  Motor: The patient has good strength in all 4 extremities.  Sensory examination: Soft touch sensation is reported to the decreased on the left face, arm, and leg.  Coordination: The patient has good finger-nose-finger and heel-to-shin bilaterally. Apraxia with the use of the extremities is noted.  Gait and station: The patient has a wide-based, unsteady gait. Tandem gait is poor. Romberg is negative.  Reflexes: Deep tendon reflexes are symmetric.   Assessment/Plan:  1. Multiple sclerosis  2. Gait disturbance  3. Neurogenic bladder  4. 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 The patient is unable to relate what medication she is taking. She is unable to follow directions on taking medications, she never returned for her third Solu-Medrol injection, and she never went on the prednisone taper. A prescription was written today for a prednisone Dosepak, 10 mg 12 day pack. The patient will get another injection of Solu-Medrol today. She will be set up for physical therapy for gait training, a prescription was given for a cane. She was given a handicapped sticker request form for [employer]. She will follow-up in 3 months, sooner if needed. The patient has not yet had MRI of the brain, this is pending.  The patient is to check her medications at home and call us if there are any discrepancies as compared to the list that we have in the computer.  Marlan Palau MD 02/23/2016 8:39 PM  [facility] [address] [place], [place] [address]  Phone [phone] Fax [phone]

## 2016-02-28 ENCOUNTER — Telehealth: Payer: Self-pay | Admitting: Neurology

## 2016-02-28 NOTE — Telephone Encounter (Signed)
Spoke to pt. She reports that she is still not feeling well despite OV and Solu-medrol infusion last Thursday. Says that she is still taking steroid dose pack as prescribed. However, her gait is "off" and she c/o double vision, dizziness and headache. Reviewed current meds w/ pt. Says that she is only taking... -Benazepril 20 mg daily -Gilenya 0.5 mg daily and -Women's MVI -Had Norvasc refilled but has not yet picked up.  States that she does not feel the need to continue medications if they don't help. She is currently NOT taking amitriptyline, sumatriptan or topiramate.

## 2016-02-28 NOTE — Telephone Encounter (Signed)
I called the patient. The patient continues to do poorly, she has now developed double vision that has gotten severe today, she has increased problems with walking because of this. The MRI of the brain is to be done on the 31st of this month. We may need to consider switching to Tysabri or some other more aggressive treatment for multiple sclerosis. We will need to check blood work in this regard. I will try to get a revisit with hers later this week.

## 2016-02-28 NOTE — Telephone Encounter (Signed)
Pt called states she woke up with double vision. She says that she has had an steroid infusion in the past. She says that her balance is off as well. While on the phone she seemed to have a hard time forming sentences and words. Please call and advise

## 2016-02-29 ENCOUNTER — Ambulatory Visit (INDEPENDENT_AMBULATORY_CARE_PROVIDER_SITE_OTHER): Payer: Medicare Other | Admitting: Neurology

## 2016-02-29 ENCOUNTER — Telehealth: Payer: Self-pay

## 2016-02-29 ENCOUNTER — Encounter: Payer: Self-pay | Admitting: Neurology

## 2016-02-29 VITALS — BP 139/85 | HR 58 | Ht 64.5 in | Wt 211.0 lb

## 2016-02-29 DIAGNOSIS — R413 Other amnesia: Secondary | ICD-10-CM | POA: Diagnosis not present

## 2016-02-29 DIAGNOSIS — H539 Unspecified visual disturbance: Secondary | ICD-10-CM | POA: Insufficient documentation

## 2016-02-29 DIAGNOSIS — G44221 Chronic tension-type headache, intractable: Secondary | ICD-10-CM

## 2016-02-29 DIAGNOSIS — G4701 Insomnia due to medical condition: Secondary | ICD-10-CM

## 2016-02-29 DIAGNOSIS — R269 Unspecified abnormalities of gait and mobility: Secondary | ICD-10-CM

## 2016-02-29 DIAGNOSIS — G4761 Periodic limb movement disorder: Secondary | ICD-10-CM

## 2016-02-29 DIAGNOSIS — G43711 Chronic migraine without aura, intractable, with status migrainosus: Secondary | ICD-10-CM

## 2016-02-29 DIAGNOSIS — H532 Diplopia: Secondary | ICD-10-CM | POA: Diagnosis not present

## 2016-02-29 DIAGNOSIS — G35 Multiple sclerosis: Secondary | ICD-10-CM | POA: Diagnosis not present

## 2016-02-29 DIAGNOSIS — H81399 Other peripheral vertigo, unspecified ear: Secondary | ICD-10-CM

## 2016-02-29 DIAGNOSIS — N319 Neuromuscular dysfunction of bladder, unspecified: Secondary | ICD-10-CM | POA: Diagnosis not present

## 2016-02-29 DIAGNOSIS — H8109 Meniere's disease, unspecified ear: Secondary | ICD-10-CM

## 2016-02-29 DIAGNOSIS — F09 Unspecified mental disorder due to known physiological condition: Secondary | ICD-10-CM | POA: Insufficient documentation

## 2016-02-29 HISTORY — DX: Other amnesia: R41.3

## 2016-02-29 MED ORDER — ALPRAZOLAM 0.5 MG PO TABS: ORAL_TABLET | ORAL | 0 refills | Status: DC

## 2016-02-29 NOTE — Telephone Encounter (Signed)
[facility] pick-up conf # [id number]

## 2016-02-29 NOTE — Telephone Encounter (Signed)
Patient has been worked-in w/ Dr. Anne Hahn today.

## 2016-02-29 NOTE — Telephone Encounter (Signed)
Left message for a return call - need to schedule her for an appt this week w/ Dr. Anne Hahn.

## 2016-02-29 NOTE — Progress Notes (Signed)
Reason for visit: Multiple sclerosis  Connie Robertson is an 45 y.o. female  History of present illness:  Connie Robertson is a 45 year old right-handed black female with a history of multiple sclerosis. The patient has had significant issues with symptoms of worsening physical and cognitive deficits over the last several weeks. The patient has been on Gilenya, she has done well on the medication several years. The patient has had ongoing issues with balance, she has not had any falls, she has developed some double vision over the last several days, worse yesterday. The patient has also reports some worsening of headache. Her migraine has been more active over the last several weeks, she remains on Topamax for this. The patient is having daily headaches. The patient has had problems with memory and cognitive processing. She has had a neurogenic bladder. She comes back in today for reevaluation. The patient continues to get worse even on an oral prednisone taper.  Past Medical History:  Diagnosis Date  . Abnormality of gait 11/25/2014  . Chronic daily headache 2003   [facility]  . Depression   . MS (multiple sclerosis) (HCC) 1992   Dr. Sandria Manly  . Multiple sclerosis (HCC) 11/21/2013  . Neurogenic bladder 11/21/2013    Past Surgical History:  Procedure Laterality Date  . ABLATION COLPOCLESIS  2004  . CESAREAN SECTION    . TUBAL LIGATION      Family History  Problem Relation Age of Onset  . Heart failure Father   . Multiple sclerosis Sister   . Cancer Neg Hx   . Diabetes Neg Hx   . Hyperlipidemia Neg Hx   . Hypertension Neg Hx   . Kidney disease Neg Hx   . Stroke Neg Hx     Social history:  reports that she has never smoked. She has never used smokeless tobacco. She reports that she drinks about 1.2 oz of alcohol per week . She reports that she does not use drugs.   No Known Allergies  Medications:  Prior to Admission medications   Medication Sig Start Date End Date Taking?  Authorizing Provider  ALPRAZolam Prudy Feeler) 0.5 MG tablet Take 2 tablets approximately 45 minutes prior to the MRI study, take a third tablet if needed. 02/29/16  Yes York Spaniel, MD  amitriptyline (ELAVIL) 25 MG tablet Take 2 tablets (50 mg total) by mouth at bedtime. 06/07/15  Yes Anson Fret, MD  amLODipine (NORVASC) 5 MG tablet Take 5 mg by mouth daily. 11/14/13  Yes Historical Provider, MD  benazepril (LOTENSIN) 20 MG tablet Take 20 mg by mouth daily. 11/14/13  Yes Historical Provider, MD  Fingolimod HCl (GILENYA) 0.5 MG CAPS Take 1 capsule (0.5 mg total) by mouth daily. 02/22/16  Yes Carmen Dohmeier, MD  predniSONE (DELTASONE) 10 MG tablet Begin taking 6 tablets daily, taper by one tablet every other day until off the medication. 02/23/16  Yes York Spaniel, MD  solifenacin (VESICARE) 5 MG tablet Take 1 tablet (5 mg total) by mouth daily. 01/25/16  Yes Butch Penny, NP  SUMAtriptan (IMITREX) 100 MG tablet Take 1 tablet (100 mg total) by mouth 2 (two) times daily as needed for migraine. 09/01/15  Yes York Spaniel, MD  topiramate (TOPAMAX) 50 MG tablet Take 4 tablets (200 mg total) by mouth at bedtime. 12/01/15  Yes Butch Penny, NP  valACYclovir (VALTREX) 1000 MG tablet  11/02/15  Yes Historical Provider, MD  zaleplon (SONATA) 10 MG capsule Take 1 capsule (10 mg total) by  mouth at bedtime as needed for sleep. 06/28/15  Yes Melvyn Novas, MD    ROS:  Out of a complete 14 system review of symptoms, the patient complains only of the following symptoms, and all other reviewed systems are negative.  Double vision Gait disorder  Blood pressure 139/85, pulse (!) 58, height 5' 4.5" (1.638 m), weight 211 lb (95.7 kg).  Physical Exam  General: The patient is alert and cooperative at the time of the examination.  Skin: No significant peripheral edema is noted.   Neurologic Exam  Mental status: The patient is alert and oriented x 3 at the time of the examination. The patient has  apparent normal recent and remote memory, with an apparently normal attention span and concentration ability.   Cranial nerves: Facial symmetry is present. Speech is normal, no aphasia or dysarthria is noted. Extraocular movements are full. Visual fields are full. The patient has coarse nystagmus when looking to the left. The patient has nystagmus with primary gaze, fast phase to the right. Pupils are equal, round, and reactive to light. Discs are flat bilaterally.  Motor: The patient has good strength in all 4 extremities.  Sensory examination: Soft touch sensation is symmetric on the face, somewhat decreased on the left arm and leg..  Coordination: The patient has good finger-nose-finger and heel-to-shin bilaterally.  Gait and station: The patient has a wide-based gait. The patient will tend to stumble with turns. Tandem gait was not attempted. Romberg is negative.  Reflexes: Deep tendon reflexes are symmetric.   Assessment/Plan:  1. Multiple sclerosis  2. Migraine headache  3. Recent exacerbation of MS, double vision and gait disorder  4. Cognitive decline  The patient continues to worsen with her clinical deficits on prednisone. The patient has been set up for MRI evaluation of the brain which will be done in the near future. The patient remains on Gilenya, we will consider a switch to another agent such as Tysabri or Zinbryta. The patient clearly has cognitive deficits associated with multiple sclerosis. She has difficulty keeping up with the medication regimen. I have given her a prescription for a cane, we will set her up for physical therapy for gait training. Blood work will be done today. The blood work will help [place] decide what next step to take with medication therapy. I will contact her regarding the results of this. She will have Solu-Medrol injections over the next 4 days, 500 mg each day.  Marlan Palau MD 02/29/2016 2:01 PM  [facility] [address] [place], [place] [address]  Phone [phone] Fax [phone]

## 2016-03-01 ENCOUNTER — Other Ambulatory Visit: Payer: Self-pay

## 2016-03-01 ENCOUNTER — Ambulatory Visit: Payer: Self-pay | Admitting: Neurology

## 2016-03-01 DIAGNOSIS — G35 Multiple sclerosis: Secondary | ICD-10-CM

## 2016-03-01 DIAGNOSIS — G43711 Chronic migraine without aura, intractable, with status migrainosus: Secondary | ICD-10-CM

## 2016-03-01 DIAGNOSIS — G4701 Insomnia due to medical condition: Secondary | ICD-10-CM

## 2016-03-01 DIAGNOSIS — H8109 Meniere's disease, unspecified ear: Secondary | ICD-10-CM

## 2016-03-01 DIAGNOSIS — G4761 Periodic limb movement disorder: Secondary | ICD-10-CM

## 2016-03-01 LAB — HEPATITIS B CORE ANTIBODY, TOTAL: HEP B C TOTAL AB: NEGATIVE

## 2016-03-01 LAB — VARICELLA ZOSTER ANTIBODY, IGG: VARICELLA: 3797 {index} (ref >165)

## 2016-03-01 LAB — HEPATITIS B SURFACE ANTIGEN: Hepatitis B Surface Ag: NEGATIVE

## 2016-03-01 LAB — HEPATITIS C ANTIBODY: Hep C Virus Ab: 0.1 s/co ratio (ref 0.0–0.9)

## 2016-03-01 MED ORDER — ZALEPLON 10 MG PO CAPS: 10.0000 mg | ORAL_CAPSULE | Freq: Every evening | ORAL | 0 refills | Status: DC | PRN

## 2016-03-01 NOTE — Telephone Encounter (Signed)
Received a faxed refill request for pt's sonata. Dr. Vickey Huger last prescribed in 06/2015. Pt's sleep study results from Dr. Vickey Huger instructed pt to follow up with primary neurologist.  Will send refill request to Dr. Anne Hahn for him to review.

## 2016-03-01 NOTE — Telephone Encounter (Signed)
RX for sonata was faxed to [facility]. Received a receipt of confirmation.

## 2016-03-02 ENCOUNTER — Ambulatory Visit: Payer: Self-pay | Admitting: Neurology

## 2016-03-03 ENCOUNTER — Ambulatory Visit: Payer: Self-pay | Admitting: Neurology

## 2016-03-05 LAB — QUANTIFERON IN TUBE
QFT TB AG MINUS NIL VALUE: 0 [IU]/mL
QUANTIFERON TB AG VALUE: 0.03 IU/mL
QUANTIFERON TB GOLD: NEGATIVE
Quantiferon Nil Value: 0.03 IU/mL

## 2016-03-05 LAB — QUANTIFERON TB GOLD ASSAY (BLOOD)

## 2016-03-06 ENCOUNTER — Telehealth: Payer: Self-pay | Admitting: Neurology

## 2016-03-06 ENCOUNTER — Ambulatory Visit
Admission: RE | Admit: 2016-03-06 | Discharge: 2016-03-06 | Disposition: A | Discharge status: Home / Self Care | Payer: Medicare Other | Source: Ambulatory Visit | Attending: Neurology | Admitting: Neurology

## 2016-03-06 DIAGNOSIS — G35 Multiple sclerosis: Secondary | ICD-10-CM

## 2016-03-06 MED ORDER — GADOBENATE DIMEGLUMINE 529 MG/ML IV SOLN
17.0000 mL | Freq: Once | INTRAVENOUS | Status: AC | PRN
  Administered 2016-03-06: 17 mL via INTRAVENOUS

## 2016-03-06 NOTE — Telephone Encounter (Signed)
I called the patient. The blood work so far is unremarkable, the JC virus antibody panel is not back yet.  The patient is to have her MRI the brain done today, I will call her when I get the results.

## 2016-03-07 ENCOUNTER — Telehealth: Payer: Self-pay | Admitting: Neurology

## 2016-03-07 NOTE — Telephone Encounter (Signed)
Patient is returning a call. °

## 2016-03-07 NOTE — Telephone Encounter (Signed)
Patient called back to leave different phone number to reach her [phone].

## 2016-03-07 NOTE — Telephone Encounter (Signed)
   I called the patient. The MRI shows ongoing MS activity. There are multiple enhancing lesions, the patient likely is symptomatic from the left middle cerebellar peduncle lesion. The patient is to stop the Gilenya, we will plan on switching to another medication such as Tysabri or [place]. The JC virus antibody panel is still pending. This may help our decision on treatment.  MRI brain 03/06/16:  IMPRESSION:  This MRI of the brain with and without contrast shows multiple T2/FLAIR hyperintense foci in the cerebellum, left middle cerebellar peduncle, pons and bilateral cerebral hemispheres in a pattern and configuration consistent with demyelinating plaques associated with multiple sclerosis. A large left middle cerebellar peduncle plaque and at least 8 smaller hemispheric foci enhance after gadolinium administration consistent with acute demyelination.

## 2016-03-07 NOTE — Telephone Encounter (Signed)
I called patient. I went over the MRI results with her again, she has already gone off of the Gilenya. We will consider another medication, I am waiting on the JC virus antibody panel.

## 2016-03-13 ENCOUNTER — Telehealth: Payer: Self-pay | Admitting: Neurology

## 2016-03-13 DIAGNOSIS — R269 Unspecified abnormalities of gait and mobility: Secondary | ICD-10-CM

## 2016-03-13 DIAGNOSIS — G35 Multiple sclerosis: Secondary | ICD-10-CM

## 2016-03-13 MED ORDER — GABAPENTIN 300 MG PO CAPS: 300.0000 mg | ORAL_CAPSULE | Freq: Two times a day (BID) | ORAL | 3 refills | Status: DC

## 2016-03-13 NOTE — Telephone Encounter (Signed)
Rx for cane re-printed, signed, up front for pick-up.

## 2016-03-13 NOTE — Telephone Encounter (Signed)
I called patient. The patient started having pain in the legs, she has also lost the prescription for the cane that she was given, I'll rewrite the prescription, I will start her on gabapentin for the discomfort. We are awaiting the results of the JC virus antibody. The patient is off of Gilenya.

## 2016-03-13 NOTE — Telephone Encounter (Signed)
Patient called requesting prescription for cane be left at the front desk so she can come by and pick up. Patient also states, Dr. Anne Hahn is weaning her off of Gilenya, wants to know if there is anything she can do in the interim, "my legs are in such pain".

## 2016-03-16 NOTE — Telephone Encounter (Signed)
I called the patient, we are still waiting on the JC viral antibody panel, hopefully will get the report seen.

## 2016-03-16 NOTE — Telephone Encounter (Signed)
Patient called to inquire how much longer it will take to get results of labwork and then how long it will take to be able to switch over to another medication after getting those results. Please call to advise [phone].

## 2016-03-17 ENCOUNTER — Telehealth: Payer: Self-pay | Admitting: Neurology

## 2016-03-17 ENCOUNTER — Encounter (HOSPITAL_COMMUNITY): Payer: Self-pay | Admitting: Emergency Medicine

## 2016-03-17 ENCOUNTER — Ambulatory Visit (HOSPITAL_COMMUNITY)
Admission: EM | Admit: 2016-03-17 | Discharge: 2016-03-17 | Disposition: A | Discharge status: Home / Self Care | Payer: Medicare Other | Attending: Family Medicine | Admitting: Family Medicine

## 2016-03-17 DIAGNOSIS — H6691 Otitis media, unspecified, right ear: Secondary | ICD-10-CM

## 2016-03-17 MED ORDER — HYDROCODONE-ACETAMINOPHEN 5-325 MG PO TABS: 2.0000 | ORAL_TABLET | ORAL | 0 refills | Status: DC | PRN

## 2016-03-17 MED ORDER — AMOXICILLIN 500 MG PO CAPS
500.0000 mg | ORAL_CAPSULE | Freq: Three times a day (TID) | ORAL | 0 refills | Status: DC
Start: 2016-03-17 — End: 2016-11-09

## 2016-03-17 NOTE — Telephone Encounter (Signed)
I called patient. The blood work indicated that the JC virus antibody panel was negative. I will have the patient come in to sign the forms to initiate Tysabri. She has gone off of the Gilenya.

## 2016-03-17 NOTE — Telephone Encounter (Signed)
Had not yet gotten results from labs drawn 03/01/16. Contacted [facility] to request results be faxed. Received final JCV result: NEGATIVE.

## 2016-03-17 NOTE — ED Provider Notes (Signed)
CSN: [id number]     Arrival date & time 03/17/16  1036 History   First MD Initiated Contact with Patient 03/17/16 1100     Chief Complaint  Patient presents with  . Otalgia   (Consider location/radiation/quality/duration/timing/severity/associated sxs/prior Treatment) HPI Patient is a 45 year old female known to myself with a history of muscular sclerosis presents with right ear pain for the last 3 days. She states that she had some mild cold symptoms over the weekend but it seems that has settled in her right ear now. She has been using ibuprofen at home for discomfort without any relief of symptoms. She also states that her MS medicine is not working any longer and she just had an MRI several days ago which is shown that her MS is progressing. She states that she is quite sad about this. She also states that she is awaiting neurology follow-up to determine which medicine she can start to take. Past Medical History:  Diagnosis Date  . Abnormality of gait 11/25/2014  . Chronic daily headache 2003   Love  . Depression   . Memory disorder 02/29/2016  . MS (multiple sclerosis) (HCC) 1992   Dr. Sandria Manly  . Multiple sclerosis (HCC) 11/21/2013  . Neurogenic bladder 11/21/2013   Past Surgical History:  Procedure Laterality Date  . ABLATION COLPOCLESIS  2004  . CESAREAN SECTION    . TUBAL LIGATION     Family History  Problem Relation Age of Onset  . Heart failure Father   . Multiple sclerosis Sister   . Cancer Neg Hx   . Diabetes Neg Hx   . Hyperlipidemia Neg Hx   . Hypertension Neg Hx   . Kidney disease Neg Hx   . Stroke Neg Hx    Social History  Substance Use Topics  . Smoking status: Never Smoker  . Smokeless tobacco: Never Used  . Alcohol use 1.2 oz/week    2 Standard drinks or equivalent per week   OB History    Gravida Para Term Preterm AB Living   3 2 2   1 2    SAB TAB Ectopic Multiple Live Births   1             Review of Systems  Denies: HEADACHE, NAUSEA, ABDOMINAL  PAIN, CHEST PAIN, CONGESTION, DYSURIA, SHORTNESS OF BREATH  Allergies  Review of patient's allergies indicates no known allergies.  Home Medications   Prior to Admission medications   Medication Sig Start Date End Date Taking? Authorizing Provider  amLODipine (NORVASC) 5 MG tablet Take 5 mg by mouth daily. 11/14/13  Yes Historical Provider, MD  benazepril (LOTENSIN) 20 MG tablet Take 20 mg by mouth daily. 11/14/13  Yes Historical Provider, MD  ALPRAZolam Prudy Feeler) 0.5 MG tablet Take 2 tablets approximately 45 minutes prior to the MRI study, take a third tablet if needed. 02/29/16   York Spaniel, MD  amitriptyline (ELAVIL) 25 MG tablet Take 2 tablets (50 mg total) by mouth at bedtime. 06/07/15   Anson Fret, MD  amoxicillin (AMOXIL) 500 MG capsule Take 1 capsule (500 mg total) by mouth 3 (three) times daily. 03/17/16   Tharon Aquas, PA  Fingolimod HCl (GILENYA) 0.5 MG CAPS Take 1 capsule (0.5 mg total) by mouth daily. 02/22/16   Porfirio Mylar Dohmeier, MD  gabapentin (NEURONTIN) 300 MG capsule Take 1 capsule (300 mg total) by mouth 2 (two) times daily. 03/13/16   York Spaniel, MD  HYDROcodone-acetaminophen (NORCO/VICODIN) 5-325 MG tablet Take 2 tablets by mouth  every 4 (four) hours as needed. 03/17/16   Tharon Aquas, PA  predniSONE (DELTASONE) 10 MG tablet Begin taking 6 tablets daily, taper by one tablet every other day until off the medication. 02/23/16   York Spaniel, MD  solifenacin (VESICARE) 5 MG tablet Take 1 tablet (5 mg total) by mouth daily. 01/25/16   Butch Penny, NP  SUMAtriptan (IMITREX) 100 MG tablet Take 1 tablet (100 mg total) by mouth 2 (two) times daily as needed for migraine. 09/01/15   York Spaniel, MD  topiramate (TOPAMAX) 50 MG tablet Take 4 tablets (200 mg total) by mouth at bedtime. 12/01/15   Butch Penny, NP  valACYclovir (VALTREX) 1000 MG tablet  11/02/15   Historical Provider, MD  zaleplon (SONATA) 10 MG capsule Take 1 capsule (10 mg total) by mouth at  bedtime as needed for sleep. 03/01/16   York Spaniel, MD   Meds Ordered and Administered this Visit  Medications - No data to display  BP 121/86 (BP Location: Left Arm)   Pulse 67   Temp 99.4 F (37.4 C) (Oral)   Resp 18   SpO2 99%  No data found.   Physical Exam NURSES NOTES AND VITAL SIGNS REVIEWED. CONSTITUTIONAL: Well developed, well nourished, no acute distress HEENT: normocephalic, atraumatic, right TM is red bulging with poor light reflex and no motion. Left TM has small effusion but has good reflex of light and good motion. Throat is clear. EYES: Conjunctiva normal NECK:normal ROM, supple, no adenopathy PULMONARY:No respiratory distress, normal effort ABDOMINAL: Soft, ND, NT BS+, No CVAT MUSCULOSKELETAL: Normal ROM of all extremities,  SKIN: warm and dry without rash PSYCHIATRIC: Mood and affect, behavior are normal  Urgent Care Course   Clinical Course    Procedures (including critical care time)  Labs Review Labs Reviewed - No data to display  Imaging Review No results found.   Visual Acuity Review  Right Eye Distance:   Left Eye Distance:   Bilateral Distance:    Right Eye Near:   Left Eye Near:    Bilateral Near:        Amoxicillin and 2 days treatment regimen of hydrocodone MDM   1. Acute right otitis media, recurrence not specified, unspecified otitis media type     Patient is reassured that there are no issues that require transfer to higher level of care at this time or additional tests. Patient is advised to continue home symptomatic treatment. Patient is advised that if there are new or worsening symptoms to attend the emergency department, contact primary care provider, or return to UC. Instructions of care provided discharged home in stable condition.    THIS NOTE WAS GENERATED USING A VOICE RECOGNITION SOFTWARE PROGRAM. ALL REASONABLE EFFORTS  WERE MADE TO PROOFREAD THIS DOCUMENT FOR ACCURACY.  I have verbally reviewed the  discharge instructions with the patient. A printed AVS was given to the patient.  All questions were answered prior to discharge.      Tharon Aquas, PA 03/17/16 1342

## 2016-03-17 NOTE — ED Triage Notes (Signed)
C/o bilateral ear pain onset x2 days... Reports she just got over a cold.... A&O x4... NAD

## 2016-03-20 NOTE — Telephone Encounter (Signed)
MS Touch/Tysabri start forms completed, awaiting patient signature. Verbal and printed notification given to  [facility].

## 2016-03-20 NOTE — Telephone Encounter (Signed)
Pt came into office today to sign Tysabri start forms. Questions answered. Signed copy to [facility].

## 2016-03-22 ENCOUNTER — Encounter: Payer: Self-pay | Admitting: Physical Therapy

## 2016-03-22 ENCOUNTER — Encounter (HOSPITAL_COMMUNITY): Payer: Self-pay | Admitting: Emergency Medicine

## 2016-03-22 ENCOUNTER — Ambulatory Visit (HOSPITAL_COMMUNITY)
Admission: EM | Admit: 2016-03-22 | Discharge: 2016-03-22 | Disposition: A | Discharge status: Home / Self Care | Payer: Medicare Other | Attending: Family Medicine | Admitting: Family Medicine

## 2016-03-22 ENCOUNTER — Ambulatory Visit: Payer: Medicare Other | Attending: Neurology | Admitting: Physical Therapy

## 2016-03-22 DIAGNOSIS — R42 Dizziness and giddiness: Secondary | ICD-10-CM

## 2016-03-22 DIAGNOSIS — H66003 Acute suppurative otitis media without spontaneous rupture of ear drum, bilateral: Secondary | ICD-10-CM | POA: Diagnosis not present

## 2016-03-22 DIAGNOSIS — R2681 Unsteadiness on feet: Secondary | ICD-10-CM | POA: Insufficient documentation

## 2016-03-22 DIAGNOSIS — H65193 Other acute nonsuppurative otitis media, bilateral: Secondary | ICD-10-CM

## 2016-03-22 DIAGNOSIS — H8113 Benign paroxysmal vertigo, bilateral: Secondary | ICD-10-CM

## 2016-03-22 DIAGNOSIS — R2689 Other abnormalities of gait and mobility: Secondary | ICD-10-CM | POA: Diagnosis present

## 2016-03-22 MED ORDER — HYDROCODONE-ACETAMINOPHEN 5-325 MG PO TABS: 1.0000 | ORAL_TABLET | Freq: Four times a day (QID) | ORAL | 0 refills | Status: AC | PRN

## 2016-03-22 NOTE — ED Provider Notes (Signed)
CSN: [id number]     Arrival date & time 03/22/16  8101 History   First MD Initiated Contact with Patient 03/22/16 1058     Chief Complaint  Patient presents with  . Otalgia   (Consider location/radiation/quality/duration/timing/severity/associated sxs/prior Treatment) Connie Robertson was recently seem 4 days ago in our Urgent Care for an ear infection. She was given amoxicillin for the infection and hydrocodone for her pain.  She was given 6 tabs of hydrocodone, states that she is here mainly requesting for another refill of hydrocodone for her ear pain. She continues to have ear pain. Ear pain is bilateral; right worst than left.        Past Medical History:  Diagnosis Date  . Abnormality of gait 11/25/2014  . Chronic daily headache 2003   Love  . Depression   . Memory disorder 02/29/2016  . MS (multiple sclerosis) (HCC) 1992   Dr. Sandria Manly  . Multiple sclerosis (HCC) 11/21/2013  . Neurogenic bladder 11/21/2013   Past Surgical History:  Procedure Laterality Date  . ABLATION COLPOCLESIS  2004  . CESAREAN SECTION    . TUBAL LIGATION     Family History  Problem Relation Age of Onset  . Heart failure Father   . Multiple sclerosis Sister   . Cancer Neg Hx   . Diabetes Neg Hx   . Hyperlipidemia Neg Hx   . Hypertension Neg Hx   . Kidney disease Neg Hx   . Stroke Neg Hx    Social History  Substance Use Topics  . Smoking status: Never Smoker  . Smokeless tobacco: Never Used  . Alcohol use 1.2 oz/week    2 Standard drinks or equivalent per week   OB History    Gravida Para Term Preterm AB Living   3 2 2   1 2    SAB TAB Ectopic Multiple Live Births   1             Review of Systems  Constitutional: Positive for fatigue. Negative for chills and fever.  HENT: Positive for congestion, ear discharge, ear pain, rhinorrhea, sneezing and sore throat.        Ear discharge is clear fluid, more in right ear than left  Respiratory: Positive for cough. Negative for shortness of breath.    Cardiovascular: Negative for chest pain, palpitations and leg swelling.    Allergies  Review of patient's allergies indicates no known allergies.  Home Medications   Prior to Admission medications   Medication Sig Start Date End Date Taking? Authorizing Provider  ALPRAZolam Prudy Feeler) 0.5 MG tablet Take 2 tablets approximately 45 minutes prior to the MRI study, take a third tablet if needed. 02/29/16  Yes York Spaniel, MD  amitriptyline (ELAVIL) 25 MG tablet Take 2 tablets (50 mg total) by mouth at bedtime. 06/07/15  Yes Anson Fret, MD  amLODipine (NORVASC) 5 MG tablet Take 5 mg by mouth daily. 11/14/13  Yes Historical Provider, MD  amoxicillin (AMOXIL) 500 MG capsule Take 1 capsule (500 mg total) by mouth 3 (three) times daily. 03/17/16  Yes Tharon Aquas, PA  benazepril (LOTENSIN) 20 MG tablet Take 20 mg by mouth daily. 11/14/13  Yes Historical Provider, MD  Fingolimod HCl (GILENYA) 0.5 MG CAPS Take 1 capsule (0.5 mg total) by mouth daily. Patient not taking: Reported on 03/22/2016 02/22/16  Yes Porfirio Mylar Dohmeier, MD  gabapentin (NEURONTIN) 300 MG capsule Take 1 capsule (300 mg total) by mouth 2 (two) times daily. 03/13/16  Yes Hennie Duos  Anne HahnWillis, MD  solifenacin (VESICARE) 5 MG tablet Take 1 tablet (5 mg total) by mouth daily. 01/25/16  Yes Butch PennyMegan Millikan, NP  SUMAtriptan (IMITREX) 100 MG tablet Take 1 tablet (100 mg total) by mouth 2 (two) times daily as needed for migraine. 09/01/15  Yes York Spanielharles K Willis, MD  topiramate (TOPAMAX) 50 MG tablet Take 4 tablets (200 mg total) by mouth at bedtime. 12/01/15  Yes Butch PennyMegan Millikan, NP  valACYclovir (VALTREX) 1000 MG tablet  11/02/15  Yes Historical Provider, MD  zaleplon (SONATA) 10 MG capsule Take 1 capsule (10 mg total) by mouth at bedtime as needed for sleep. 03/01/16  Yes York Spanielharles K Willis, MD  HYDROcodone-acetaminophen (NORCO/VICODIN) 5-325 MG tablet Take 1-2 tablets by mouth every 6 (six) hours as needed. 03/22/16 03/24/16  Lucia EstelleFeng Teren Zurcher, NP  predniSONE  (DELTASONE) 10 MG tablet Begin taking 6 tablets daily, taper by one tablet every other day until off the medication. 02/23/16   York Spanielharles K Willis, MD   Meds Ordered and Administered this Visit  Medications - No data to display  BP 133/84 (BP Location: Left Arm)   Pulse 68   Temp 98.7 F (37.1 C)   Resp 20   SpO2 100%  No data found.   Physical Exam  Constitutional: She is oriented to person, place, and time. She appears well-developed and well-nourished.  HENT:  Head: Normocephalic and atraumatic.  Mouth/Throat: Oropharynx is clear and moist. No oropharyngeal exudate.  Ear canals are clear bilaterally. Erythema noted in right TM, mild redness also noted in left TM.  Right worst than left.   Eyes: EOM are normal. Pupils are equal, round, and reactive to light.  Neck: Normal range of motion.  Cardiovascular: Normal rate and regular rhythm.   Pulmonary/Chest: Effort normal and breath sounds normal.  Lymphadenopathy:    She has no cervical adenopathy.  Neurological: She is alert and oriented to person, place, and time.  Skin: Skin is warm and dry.  Psychiatric: She has a normal mood and affect.  Vitals reviewed.   Urgent Care Course   Clinical Course    Procedures (including critical care time)  Labs Review Labs Reviewed - No data to display  Imaging Review No results found.      MDM   1. Acute suppurative otitis media of both ears without spontaneous rupture of tympanic membranes, recurrence not specified    Diagnosis: Acute Otitis Media; Bilaterally, non recurrent  Prescription for hydrocodone 12 tabs given; [place] controlled substances reviewed and is appropriate. Continue to finish the amoxicillin. RTC if ear pain persist or worsen despite taking antibiotic.    Lucia EstelleFeng Maaz Spiering, NP 03/22/16 1253

## 2016-03-22 NOTE — Discharge Instructions (Signed)
Please follow up with ENT if ear infection continues or if you continues to experience ear pain after finishing the antibiotic

## 2016-03-22 NOTE — ED Triage Notes (Signed)
The patient presented to the [facility] with a complaint of bilateral ear pain that started 03/15/2016. The patient was evaluated at the [facility] on 03/17/2016 and prescribed an oral antibiotic and a pain medication...she stated that neither are working.

## 2016-03-23 ENCOUNTER — Telehealth: Payer: Self-pay | Admitting: Neurology

## 2016-03-23 ENCOUNTER — Emergency Department (HOSPITAL_COMMUNITY): Payer: Medicare Other

## 2016-03-23 ENCOUNTER — Emergency Department (HOSPITAL_COMMUNITY)
Admission: EM | Admit: 2016-03-23 | Discharge: 2016-03-24 | Disposition: A | Discharge status: Home / Self Care | Payer: Medicare Other | Attending: Emergency Medicine | Admitting: Emergency Medicine

## 2016-03-23 ENCOUNTER — Encounter (HOSPITAL_COMMUNITY): Payer: Self-pay | Admitting: *Deleted

## 2016-03-23 DIAGNOSIS — M6281 Muscle weakness (generalized): Secondary | ICD-10-CM | POA: Insufficient documentation

## 2016-03-23 DIAGNOSIS — R200 Anesthesia of skin: Secondary | ICD-10-CM | POA: Insufficient documentation

## 2016-03-23 DIAGNOSIS — I10 Essential (primary) hypertension: Secondary | ICD-10-CM | POA: Diagnosis not present

## 2016-03-23 DIAGNOSIS — R531 Weakness: Secondary | ICD-10-CM

## 2016-03-23 LAB — CBC
HEMATOCRIT: 36.9 % (ref 36.0–46.0)
HEMOGLOBIN: 12.2 g/dL (ref 12.0–15.0)
MCH: 30.8 pg (ref 26.0–34.0)
MCHC: 33.1 g/dL (ref 30.0–36.0)
MCV: 93.2 fL (ref 78.0–100.0)
Platelets: 258 10*3/uL (ref 150–400)
RBC: 3.96 MIL/uL (ref 3.87–5.11)
RDW: 12.5 % (ref 11.5–15.5)
WBC: 3 10*3/uL — AB (ref 4.0–10.5)

## 2016-03-23 LAB — BASIC METABOLIC PANEL
ANION GAP: 5 (ref 5–15)
BUN: 15 mg/dL (ref 6–20)
CALCIUM: 9.5 mg/dL (ref 8.9–10.3)
CO2: 25 mmol/L (ref 22–32)
Chloride: 111 mmol/L (ref 101–111)
Creatinine, Ser: 0.95 mg/dL (ref 0.44–1.00)
GLUCOSE: 93 mg/dL (ref 65–99)
POTASSIUM: 3.5 mmol/L (ref 3.5–5.1)
Sodium: 141 mmol/L (ref 135–145)

## 2016-03-23 LAB — I-STAT BETA HCG BLOOD, ED (MC, WL, AP ONLY)

## 2016-03-23 LAB — URINALYSIS, ROUTINE W REFLEX MICROSCOPIC
BILIRUBIN URINE: NEGATIVE
Glucose, UA: NEGATIVE mg/dL
Hgb urine dipstick: NEGATIVE
Ketones, ur: NEGATIVE mg/dL
LEUKOCYTES UA: NEGATIVE
NITRITE: NEGATIVE
PH: 6 (ref 5.0–8.0)
Protein, ur: NEGATIVE mg/dL
SPECIFIC GRAVITY, URINE: 1.025 (ref 1.005–1.030)

## 2016-03-23 MED ORDER — METHYLPREDNISOLONE SODIUM SUCC 1000 MG IJ SOLR
250.0000 mg | Freq: Once | INTRAMUSCULAR | Status: AC
  Administered 2016-03-24: 250 mg via INTRAVENOUS
  Filled 2016-03-23: qty 2

## 2016-03-23 MED ORDER — ALPRAZOLAM 0.25 MG PO TABS
1.0000 mg | ORAL_TABLET | Freq: Once | ORAL | Status: AC | PRN
  Administered 2016-03-23: 1 mg via ORAL
  Filled 2016-03-23: qty 4

## 2016-03-23 MED ORDER — GADOBENATE DIMEGLUMINE 529 MG/ML IV SOLN
20.0000 mL | Freq: Once | INTRAVENOUS | Status: AC | PRN
  Administered 2016-03-23: 20 mL via INTRAVENOUS

## 2016-03-23 MED ORDER — CORTICOTROPIN 80 UNIT/ML IJ GEL: 80.0000 [IU] | Freq: Every day | INTRAMUSCULAR | 0 refills | Status: AC

## 2016-03-23 NOTE — Telephone Encounter (Signed)
Pt called in with some questions about some side effects she is having. She says she is in a transition period where she is not taking in meds . Her face is numb, tongue is numb, trouble speaking , right hand numb. No facial drooping. Please call and advise [phone]

## 2016-03-23 NOTE — ED Provider Notes (Signed)
Emergency Department Provider Note   I have reviewed the triage vital signs and the nursing notes.   HISTORY  Chief Complaint Numbness   HPI Connie Robertson is a 45 y.o. female with PMH of MS presents to the ED with relatively acute onset RUE and RLE weakness. Patient states she is currently working with her outpatient neurologist Dr. Anne Hahn regarding her MS symptoms. She states in July her symptoms are primarily speech related. Yesterday the patient noticed some sudden difficulty walking and tingling sensation on the right side of the body. The symptoms have continued and and the patient appreciates right side body weakness. She feels that her speech is not completely back to normal. She also has some tingling sensation in her tongue and lips. Apical deep breathing. No chest pain. No recent fevers or chills. The patient has discontinued her MS medication under the supervision of her neurologist. Delene Loll been doing steroid infusions with her last infusion 2 weeks ago. The plan is to transition the patient to [place] per the telephone encounter note from Dr. Anne Hahn today.    Past Medical History:  Diagnosis Date  . Abnormality of gait 11/25/2014  . Chronic daily headache 2003   Love  . Depression   . Memory disorder 02/29/2016  . MS (multiple sclerosis) (HCC) 1992   Dr. Sandria Manly  . Multiple sclerosis (HCC) 11/21/2013  . Neurogenic bladder 11/21/2013    Patient Active Problem List   Diagnosis Date Noted  . Diplopia 02/29/2016  . Memory disorder 02/29/2016  . Abnormality of gait 11/25/2014  . Multiple sclerosis (HCC) 11/21/2013  . Neurogenic bladder 11/21/2013  . Headache, tension type, chronic 11/17/2011  . Hot flashes 11/17/2011  . Essential hypertension, benign 09/20/2011  . Other screening mammogram 08/18/2011  . Routine general medical examination at a health care facility 08/18/2011    Past Surgical History:  Procedure Laterality Date  . ABLATION COLPOCLESIS  2004  .  CESAREAN SECTION    . TUBAL LIGATION      Current Outpatient Rx  . Order #: [id number] Class: Print  . Order #: [id number] Class: Normal  . Order #: [id number] Class: Historical Med  . Order #: [id number] Class: Normal  . Order #: [id number] Class: Historical Med  . Order #: [id number] Class: Normal  . Order #: [id number] Class: Print  . Order #: [id number] Class: Normal  . Order #: [id number] Class: Normal  . Order #: [id number] Class: Normal  . Order #: [id number] Class: Historical Med  . Order #: [id number] Class: Print  . Order #: [id number] Class: Print  . Order #: [id number] Class: Normal    Allergies Review of patient's allergies indicates no known allergies.  Family History  Problem Relation Age of Onset  . Heart failure Father   . Multiple sclerosis Sister   . Cancer Neg Hx   . Diabetes Neg Hx   . Hyperlipidemia Neg Hx   . Hypertension Neg Hx   . Kidney disease Neg Hx   . Stroke Neg Hx     Social History Social History  Substance Use Topics  . Smoking status: Never Smoker  . Smokeless tobacco: Never Used  . Alcohol use 1.2 oz/week    2 Standard drinks or equivalent per week    Review of Systems  Constitutional: No fever/chills Eyes: No visual changes. ENT: No sore throat. Cardiovascular: Denies chest pain. Respiratory: Denies shortness of breath. Gastrointestinal: No abdominal pain.  No nausea, no vomiting.  No diarrhea.  No constipation. Genitourinary: Negative for dysuria. Musculoskeletal: Negative for back pain. Skin: Negative for rash. Neurological:  Negative for headaches. Patient with RUE and RLE weakness and numbness.   10-point ROS otherwise negative.  ____________________________________________   PHYSICAL EXAM:  VITAL SIGNS: ED Triage Vitals  Enc Vitals Group     BP 03/23/16 1357 148/70     Pulse Rate 03/23/16 1357 65     Resp 03/23/16 1357 18     Temp 03/23/16 1357 98.4 F (36.9 C)     Temp Source 03/23/16 1357 Oral     SpO2 03/23/16 1357 100 %     Pain  Score 03/23/16 1923 8   Constitutional: Alert and oriented. Well appearing and in no acute distress. Eyes: Conjunctivae are normal. PERRL. EOMI. Head: Atraumatic. Nose: No congestion/rhinnorhea. Mouth/Throat: Mucous membranes are moist.  Oropharynx non-erythematous. Neck: No stridor.  Cardiovascular: Normal rate, regular rhythm. Good peripheral circulation. Grossly normal heart sounds.   Respiratory: Normal respiratory effort.  No retractions. Lungs CTAB. Gastrointestinal: Soft and nontender. No distention.  Musculoskeletal: No lower extremity tenderness nor edema. No gross deformities of extremities. Neurologic:  Speech is slightly slurred. No focal CN deficits 2-12. 4/5 RUE strength in biceps/triceps with positive pronator drift. 4+/5 RLE strength. Decreased sensation to light touch in RUE/RLE.  Skin:  Skin is warm, dry and intact. No rash noted. Psychiatric: Mood and affect are normal. Speech and behavior are normal.  ____________________________________________   LABS (all labs ordered are listed, but only abnormal results are displayed)  Labs Reviewed  CBC - Abnormal; Notable for the following:       Result Value   WBC 3.0 (*)    All other components within normal limits  URINALYSIS, ROUTINE W REFLEX MICROSCOPIC (NOT AT [facility]) - Abnormal; Notable for the following:    APPearance CLOUDY (*)    All other components within normal limits  BASIC METABOLIC PANEL  CBG MONITORING, ED  I-STAT BETA HCG BLOOD, ED ([facility], [facility], [facility] ONLY)   ____________________________________________  EKG  Reviewed in MUSE. No STEMI.  ____________________________________________  RADIOLOGY  Mr Laqueta Jean Or Wo Contrast  Result Date: 03/23/2016 CLINICAL DATA:  45 y/o F; history of multiple sclerosis with increasing numbness to the mouth and right hand. Speech difficulties and bilateral ear infection. EXAM: MRI HEAD WITHOUT AND WITH CONTRAST TECHNIQUE: Multiplanar, multiecho pulse sequences of the brain and  surrounding structures were obtained without and with intravenous contrast. CONTRAST:  20mL MULTIHANCE GADOBENATE DIMEGLUMINE 529 MG/ML IV SOLN COMPARISON:  None. FINDINGS: Brain: There are innumerable foci of T2 FLAIR hyperintensity throughout subcortical and periventricular white matter with multiple juxta cortical foci, lesions throughout the corpus callosum, lesions within the bilateral genu and posterior limbs of the internal capsule, lesions within subinsular white matter, lesions within the left midbrain and pons, lesions within the bilateral cerebellar white matter, left brachium pontis, and right posterior medulla. Multiple lesions demonstrate enhancement (approximately 5-10) scattered throughout the subcortical, juxta cortical, and periventricular white matter of the supratentorial brain as well as within the left aspect of pons and left brachium pontis. Lesions within the frontal periventricular white matter and within the left pons demonstrate diffusion restriction. There is no diffusion restriction in a pattern to suggest acute or early subacute infarct. Condition partially visualized are cervical cord lesions at the C2-3 and C4 levels. There is mild generalized volume loss. No evidence of intracranial hemorrhage or focal mass effect. Extra-axial space: No midline shift. No effacement of basilar cisterns. No extra-axial collection is identified. Proximal intracranial flow voids are maintained. No abnormality of the cervical medullary junction. Other: Moderate  mucosal thickening of the right maxillary sinus and ethmoid air cells. Right greater than left bilateral mastoid effusions which demonstrate mild enhancement. Orbits are unremarkable. Calvarium is unremarkable. IMPRESSION: 1. Extensive white matter lesions of both supra and infratentorial brain in a pattern consistent with severe demyelination and multiple sclerosis. Several lesions enhance and/or demonstrate diffusion restriction indicating active  demyelination. The dominant active lesion is within the left pons. 2. No evidence of acute infarct, hemorrhage, or focal mass effect. 3. Bilateral mastoid effusions with mild enhancement which may represent granulation tissue or infection. 4. Mild paranasal sinus disease. Electronically Signed   By: Mitzi HansenLance  Furusawa-Stratton M.D.   On: 03/23/2016 22:29    ____________________________________________   PROCEDURES  Procedure(s) performed:   Procedures  None ____________________________________________   INITIAL IMPRESSION / ASSESSMENT AND PLAN / ED COURSE  Pertinent labs & imaging results that were available during my care of the patient were reviewed by me and considered in my medical decision making (see chart for details).  Patient resents to the emergency department for evaluation of right upper and lower extremity weakness and numbness. No cranial nerve findings with the patient does have some slurred speech. Neurological exam is abnormal and noted below. Patient has a history of recurrent migraines with no worsening in migraine symptoms. I reviewed labs drawn in triage. We'll contact neurology to decide on steroid therapy and to give guidance on further head imaging.   08:20 PM Spoke with Neurologist on call Dr. Roxy Mannsster. Concerned regarding the sudden onset symptoms and feels this is somewhat atypical for MS flare. Will obtain MR brain w/wo contrast for further evaluation. Would admit if ischemia. If flare related would give steroids and have her follow with Dr. Anne HahnWillis as outpatient. He will follow along with MR findings.   11:34 PM Spoke with neurology Dr. Arlyce DiceAster. Will give Solumedrol 250 mg IV here in the ED and discharge home with f/u with Dr. Anne HahnWillis for further treatment. Discussed this plan with patient and family who are in agreement.    Updated patient and family who are in agreement with plan and will call Dr. Anne HahnWillis for outpatient f/u in the AM.    ____________________________________________  FINAL CLINICAL IMPRESSION(S) / ED DIAGNOSES  Final diagnoses:  Right sided weakness  Numbness     MEDICATIONS GIVEN DURING THIS VISIT:  Medications  ALPRAZolam (XANAX) tablet 1 mg (1 mg Oral Given 03/23/16 2107)  gadobenate dimeglumine (MULTIHANCE) injection 20 mL (20 mLs Intravenous Contrast Given 03/23/16 2158)  methylPREDNISolone sodium succinate (SOLU-MEDROL) 250 mg in sodium chloride 0.9 % 50 mL IVPB (250 mg Intravenous Given 03/24/16 0043)     NEW OUTPATIENT MEDICATIONS STARTED DURING THIS VISIT:  None   Note:  This document was prepared using Dragon voice recognition software and may include unintentional dictation errors.  Alona BeneJoshua Long, MD Emergency Medicine   Maia PlanJoshua G Long, MD 03/24/16 [phone]

## 2016-03-23 NOTE — Discharge Instructions (Signed)
You were seen in the ED to[place] with new weakness in your arm and leg. We performed an MRI of the brain which showed MS lesions and no stroke. We gave steroids in the ED and will have you call Dr. Anne Hahn in the AM to discuss next steps.   Return to the ED with any sudden worsening weakness, severe sudden headache, fever, chills, chest pain, or difficulty breathing.

## 2016-03-23 NOTE — ED Notes (Signed)
Patient transported to MRI. Victorino Dike

## 2016-03-23 NOTE — Therapy (Signed)
[facility] [address] [place], [place], [address] Phone: [phone]   Fax:  [phone]  Physical Therapy Evaluation  Patient Details  Name: Connie Robertson MRN: [id number] Date of Birth: 07-02-1971 Referring Provider: Hennie Duos. Anne Hahn, MD  Encounter Date: 03/22/2016      PT End of Session - 03/22/16 1532    Visit Number 1   Number of Visits 17   Date for PT Re-Evaluation 05/22/06   Authorization Type [employer] G-Code & Progress report   PT Start Time 1233   PT Stop Time 1322   PT Time Calculation (min) 49 min   Activity Tolerance Other (comment)  limited by vertigo   Behavior During Therapy WFL for tasks assessed/performed      Past Medical History:  Diagnosis Date  . Abnormality of gait 11/25/2014  . Chronic daily headache 2003   Love  . Depression   . Memory disorder 02/29/2016  . MS (multiple sclerosis) (HCC) 1992   Dr. Sandria Manly  . Multiple sclerosis (HCC) 11/21/2013  . Neurogenic bladder 11/21/2013    Past Surgical History:  Procedure Laterality Date  . ABLATION COLPOCLESIS  2004  . CESAREAN SECTION    . TUBAL LIGATION      There were no vitals filed for this visit.       Subjective Assessment - 03/22/16 1240    Subjective (P)  This 45yo female was diagnosed with Multiple Sclerosis in 1992 at the age of 49. She had first exacerbation 02/08/2016 with double vision, gait disorder and weakness. She reports previous issues with vertigo but significantly worse with exacerbation. She presents for PT evaluation by referral from Dr. Hennie Duos. Willis.    Pertinent History HTN,    Limitations Standing;Walking   Patient Stated Goals To get back to previous to exacerbation level including exercise class   Currently in Pain? No/denies            [facility] PT Assessment - 03/22/16 1230      Assessment   Medical Diagnosis MS exacerbation   Referring Provider Hennie Duos. Anne Hahn, MD   Onset  Date/Surgical Date 02/08/16   Hand Dominance Right     Precautions   Precautions Fall     Restrictions   Weight Bearing Restrictions No     Balance Screen   Has the patient fallen in the past 6 months Yes   How many times? 2  legs gave out   Has the patient had a decrease in activity level because of a fear of falling?  No   Is the patient reluctant to leave their home because of a fear of falling?  No     Home Environment   Living Environment Private residence   Living Arrangements Children  15yo & 18yo(college)   Type of Home Apartment   Home Access Stairs to enter  2nd entrance is level but longer distance   Entrance Stairs-Number of Steps 3   Entrance Stairs-Rails None   Home Layout One level   Home Equipment [place] - single point     Prior Function   Level of Independence Independent;Independent with household mobility without device;Independent with community mobility without device   Vocation On disability   Leisure spin class/gym, parenting (dancer & band)     Posture/Postural Control   Posture/Postural Control Postural limitations   Postural Limitations Rounded Shoulders;Forward head  wide stance     ROM / Strength   AROM / PROM / Strength AROM;Strength  AROM   Overall AROM  Within functional limits for tasks performed     Strength   Overall Strength Within functional limits for tasks performed     Transfers   Transfers Sit to Stand;Stand to Sit   Sit to Stand 5: Supervision;With upper extremity assist;With armrests;From chair/3-in-1  uses back of legs against chair to stabilize   Stand to Sit 5: Supervision;With upper extremity assist;With armrests;To chair/3-in-1  uses back of legs against chair to control descent     Ambulation/Gait   Ambulation/Gait Yes   Ambulation/Gait Assistance 4: Min assist;5: Supervision  SBA cane, MinA without device   Ambulation Distance (Feet) 150 Feet   Assistive device Straight cane;None   Gait Pattern Step-through  pattern;Decreased arm swing - left;Decreased step length - right;Decreased stride length;Decreased weight shift to left;Trunk flexed;Wide base of support   Ambulation Surface Indoor;Level   Gait velocity 1.71 ft/sec  <1.8 ft/sec indicates fall risk     Standardized Balance Assessment   Standardized Balance Assessment Berg Balance Test;Timed Up and Go Test     Berg Balance Test   Sit to Stand Needs minimal aid to stand or to stabilize   Standing Unsupported Able to stand 2 minutes with supervision   Sitting with Back Unsupported but Feet Supported on Floor or Stool Able to sit safely and securely 2 minutes   Stand to Sit Uses backs of legs against chair to control descent   Transfers Able to transfer safely, definite need of hands   Standing Unsupported with Eyes Closed Needs help to keep from falling   Standing Ubsupported with Feet Together Needs help to attain position but able to stand for 30 seconds with feet together   From Standing, Reach Forward with Outstretched Arm Reaches forward but needs supervision   From Standing Position, Pick up Object from Floor Unable to try/needs assist to keep balance   From Standing Position, Turn to Look Behind Over each Shoulder Needs assist to keep from losing balance and falling   Turn 360 Degrees Needs assistance while turning   Standing Unsupported, Alternately Place Feet on Step/Stool Needs assistance to keep from falling or unable to try   Standing Unsupported, One Foot in Front Needs help to step but can hold 15 seconds   Standing on One Leg Unable to try or needs assist to prevent fall   Total Score 16     Timed Up and Go Test   Normal TUG (seconds) 13.86  no device minA; >13.5sec indicates fall risk            Vestibular Assessment - 03/22/16 1230      Symptom Behavior   Type of Dizziness Spinning   Aggravating Factors Looking up to the ceiling;Moving eyes;Turning body quickly;Turning head quickly;Turning head sideways;Supine to  sit;Sit to stand;Rolling to right;Rolling to left;Forward bending   Relieving Factors Head stationary;Dark room;Rest     Occulomotor Exam   Occulomotor Alignment Normal   Gaze-induced Absent   Smooth Pursuits Intact   Saccades Intact     Positional Testing   Dix-Hallpike Dix-Hallpike Right   Sidelying Test Sidelying Left     Dix-Hallpike Right   Dix-Hallpike Right Duration >45 seconds    Dix-Hallpike Right Symptoms Upbeat, right rotatory nystagmus     Sidelying Left   Sidelying Left Duration <10 seconds, minimal symptoms   Sidelying Left Symptoms Upbeat, left rotatory nystagmus     Positional Sensitivities   Sit to Supine Moderate dizziness   Supine to Sitting  Severe dizziness                Vestibular Treatment/Exercise - 03/23/16 0001      Vestibular Treatment/Exercise   Vestibular Treatment Provided Canalith Repositioning   Canalith Repositioning Epley Manuever Right      EPLEY MANUEVER RIGHT   Number of Reps  1   Overall Response Symptoms Worsened   Response Details  significant nausea, disequilibrium after maneuver                 PT Short Term Goals - 03/22/16 1552      PT SHORT TERM GOAL #1   Title Patient verbalizes understanding of energy conservation with activities with MS. (Target Date: 04/21/2016)   Time 4   Period Weeks   Status New     PT SHORT TERM GOAL #2   Title Patient ambulates 300' with single point cane modified independent. (Target Date: 04/21/2016)   Time 4   Period Weeks   Status New     PT SHORT TERM GOAL #3   Title Patient negotiates ramps, curbs and stairs (1 rail) with single point cane with supervision. (Target Date: 04/21/2016)   Time 4   Period Weeks   Status New     PT SHORT TERM GOAL #4   Title Patient reaches 10" without balance loss. (Target Date: 04/21/2016)   Time 4   Period Weeks   Status New     PT SHORT TERM GOAL #5   Title Patient reports 25% improvement in vertigo with sit to stand and supine to  sit. (Target Date: 04/21/2016)   Time 4   Period Weeks   Status New           PT Long Term Goals - 03/22/16 1546      PT LONG TERM GOAL #1   Title Berg Balance >36/56 to indicate lower fall risk. (Target Date: 05/19/2016)   Time 8   Period Weeks   Status New     PT LONG TERM GOAL #2   Title Patient ambulates 500' including grass, paved surfaces without device modified independent. (Target Date: 05/19/2016)   Time 8   Period Weeks   Status New     PT LONG TERM GOAL #3   Title Patient negotiates ramps, curbs & stairs (1 rail) without device modified independent. (Target Date: 05/19/2016)   Time 8   Period Weeks   Status New     PT LONG TERM GOAL #4   Title Timed Up-Go <12 seconds without device independently. (Target Date: 05/19/2016)   Time 8   Period Weeks   Status New     PT LONG TERM GOAL #5   Title Patient reports 50% improvement in dizziness with sit to/from stand, supine to/from sit and rolling in bed. (Target Date: 05/19/2016)   Time 8   Period Weeks   Status New     Additional Long Term Goals   Additional Long Term Goals Yes     PT LONG TERM GOAL #6   Title Patient verbalizes understanding of energy conservation & exercising with MS / independent in HEP - ongoing fitness plan. (Target Date: 05/19/2016)   Time 8   Period Weeks               Plan - 03/22/16 1535    Clinical Impression Statement This 45yo female was diagnosed with MS 13yrs ago and first exacerbation on 02/08/2016. She has dizziness and vertigo with transitional movements. Based on presentation,  unable to rule out R posterior cupulolithiasis and L posterior canalithasis. Patient's balance is impaired with Sharlene MottsBerg Balance of 16/56 and TImed Up-Go 13.86sec without device with minA indicates fall risk. Her gait is impaired with deviations & gait velocity of 1.71 ft/sec indicating fall risk. Patient's condition is evolving and plan of care is moderate.    Rehab Potential Good   PT Frequency 2x /  week   PT Duration 8 weeks   PT Treatment/Interventions ADLs/Self Care Home Management;DME Instruction;Gait training;Stair training;Functional mobility training;Therapeutic activities;Therapeutic exercise;Balance training;Neuromuscular re-education;Patient/family education;Vestibular;Canalith Repostioning   PT Next Visit Plan Vestibular PT: reassess for PC BPPV (R > L) and treat prn. Consider Semont maneuver for R PC. After vertigo clears, assess oculomotor, central vestib impairment   Consulted and Agree with Plan of Care Patient      Patient will benefit from skilled therapeutic intervention in order to improve the following deficits and impairments:  Abnormal gait, Decreased activity tolerance, Decreased balance, Decreased knowledge of use of DME, Decreased mobility, Dizziness  Visit Diagnosis: Other abnormalities of gait and mobility  Unsteadiness on feet  Dizziness and giddiness  BPPV (benign paroxysmal positional vertigo), bilateral      G-Codes - 03/22/16 1556    Functional Assessment Tool Used Berg Balance 16/56   Functional Limitation Mobility: Walking and moving around   Mobility: Walking and Moving Around Current Status [phone]([id number]) At least 60 percent but less than 80 percent impaired, limited or restricted   Mobility: Walking and Moving Around Goal Status [phone]([id number]) At least 20 percent but less than 40 percent impaired, limited or restricted       Problem List Patient Active Problem List   Diagnosis Date Noted  . Diplopia 02/29/2016  . Memory disorder 02/29/2016  . Abnormality of gait 11/25/2014  . Multiple sclerosis (HCC) 11/21/2013  . Neurogenic bladder 11/21/2013  . Headache, tension type, chronic 11/17/2011  . Hot flashes 11/17/2011  . Essential hypertension, benign 09/20/2011  . Other screening mammogram 08/18/2011  . Routine general medical examination at a health care facility 08/18/2011    [facility] Sew[facility]leyWALDRON,Jesaiah Fabiano PT, DPT 03/23/2016, 4:02 PM  [facility] [address] [place], [place], [address] Phone: [phone]   Fax:  [phone]  Name: Marlise Evesatricia Shiplett MRN: [id number] Date of Birth: 1970-12-13

## 2016-03-23 NOTE — Telephone Encounter (Signed)
Pt called back said she can not grip with her cane now. Numbness has gotten worse since she called earlier around her mouth and tongue, throat. She said to breath and swallow is not comfortable. Pt was advised to go to ED. She declined. Please call

## 2016-03-23 NOTE — Addendum Note (Signed)
Addended by: Stephanie AcreWILLIS, CHARLES on: 03/23/2016 06:52 PM   Modules accepted: Orders

## 2016-03-23 NOTE — Telephone Encounter (Signed)
I called patient, within the last day or so, she is had new symptoms of right sided facial and mouth numbness, increasing problems with gait and using the right arm. Speaking is getting more difficult. The patient is in the emergency room for an evaluation at this time. She has not responded to oral prednisone or to IV Solu-Medrol. I will try to get ACTHAR set up for her use, but this may take several days. The patient has been approved for Tysabri, hopefully we can get this started soon.

## 2016-03-23 NOTE — Telephone Encounter (Signed)
Pt has been approved for Tysabri through [employer] Program. Spoke to [facility] earlier this morning and they are in the process of scheduling.   Talked to pt. Says that she was seen again in Urgent Care yesterday and they gave her additional antibx for ear infection. C/o sore throat esp w/ swallowing, right hand numbness, more difficulty w/ walking and feels as if her "lips are swollen although they're not." Has taken current antibx in the past w/o issues. Went for [employer] yesterday and says that "it was very uncomfortable and didn't help. I don't really want to go back but they've scheduled me until October." Plans on going to the ER this afternoon to "be checked out" unless Dr. Anne Hahn has additional recommendations.  If unable to reach her @ [phone], please call daughter's cell @ [phone].

## 2016-03-23 NOTE — ED Triage Notes (Signed)
Pt reports having MS and recent flare up. Woke up with increase in numbness to mouth and right hand. Grips are equal, no facial droop noted.

## 2016-03-24 DIAGNOSIS — M6281 Muscle weakness (generalized): Secondary | ICD-10-CM | POA: Diagnosis not present

## 2016-03-24 NOTE — Telephone Encounter (Signed)
Pt called to advise she was seen at the ED last night and rec'd steroid infusion and MRI. She said Dr Lacretia Nicks was contacted while she was there and talked about changing on e her medications until she starts the [place]. She is wanting to discuss this. Please call

## 2016-03-24 NOTE — ED Notes (Signed)
Pt states that she did not injure self and only slid and requested discharge. Pt observed reaching over and almost tipping out of wheelchair. Nurse reminded pt that she has to ask for assistance with ADL's.

## 2016-03-24 NOTE — Telephone Encounter (Signed)
Talked to pt. Reports that she continues to have symptoms of MS exacerbation despite PO and IV steroids. Questions answered about new rx Acthar. Pt verbalized understanding and appreciation for call. Start form faxed into [employer].

## 2016-03-24 NOTE — Telephone Encounter (Signed)
Acthar rx faxed to pharmacy. Will initiate PA as required.

## 2016-03-24 NOTE — ED Notes (Signed)
Nurse was informed by [facility] that patient had fell after discharge. Pt was still located in room. Mother attempted to transport patient to bathroom. Bedside commode was still in room. However pt wanted to transport to bathroom before leaving. Pt family says that pt feet slid as she was getting out of bed. Pt states that she is not hurt. Charge nurse informed.

## 2016-03-27 NOTE — Telephone Encounter (Signed)
I called patient. She continues to get worse with right-sided numbness and weakness, difficulty walking. We will set her up for 3 more days of Solu-Medrol, the Tysabri should be started soon, it may take another week to get the ACTHAR.

## 2016-03-27 NOTE — Addendum Note (Signed)
Addended by: Stephanie Acre on: 03/27/2016 05:36 PM   Modules accepted: Orders

## 2016-03-27 NOTE — Telephone Encounter (Signed)
Pt called to advise the numbness in the hand is still bad. sts her walking has gotten a little better. She is wanting a steroid infusion. Please call

## 2016-03-27 NOTE — Telephone Encounter (Signed)
Pt called in complaining of numbness in her hand. She cannot hold anything in right hand. Right foot numb. Walking if difficult for her. She says after her therapy session it has worsened. She states she went to hospital 8/17 and says they gave her infusion there. Pt wants to know if she can come in for another . Please call and advise [phone]

## 2016-03-27 NOTE — Telephone Encounter (Signed)
Pt called back again, sts she has "not heard from RN or anybody". She was advised the msg had been routed to Dr W and waiting his plan of treatment, then she would be called. Pt understood

## 2016-03-28 NOTE — Telephone Encounter (Signed)
Dr.Willis just FYI.Connie Robertson is aware of the patient thanks.

## 2016-03-28 NOTE — Telephone Encounter (Signed)
Pt called in to check on status of her infusion appt. I called Connie Robertson who stated she will call Connie pt today before she leaves.While speaking with Connie pt she wanted to just show up and hope for an opening for an infusion. I explained to Connie pt she would not be seen that way there is a protocol as well as other pts who have made appts who will be here. Pt then stated that she may just go to [facility] ER that she should be able to walk and she cannot. I told Connie pt that if she felt she needed to go to ER to please do so. At that time pt's Robertson got on Connie phone asking if pt could come in today for an infusion. Again I said no. I told Connie Robertson Connie same thing I told Connie pt about showing up with out appt.I also reiterated to Connie Robertson that Connie Robertson will call before endo of day to make appt. She also stated she may take Connie pt to ER. At that time I told Connie Robertson that if she needed to have treatment urgently to please go to ER, if not Connie Robertson will call for an appt. Robertson then said, " I guess we will see what type of appt Connie Robertson offers before going to ER." I thanked Connie Robertson for Connie call and asked if there was anything else I could do to help. Pt's Robertson said no and we said our goodbyes.

## 2016-03-29 ENCOUNTER — Ambulatory Visit: Payer: Medicare Other | Admitting: Physical Therapy

## 2016-03-29 DIAGNOSIS — R2689 Other abnormalities of gait and mobility: Secondary | ICD-10-CM | POA: Diagnosis not present

## 2016-03-29 DIAGNOSIS — H8113 Benign paroxysmal vertigo, bilateral: Secondary | ICD-10-CM

## 2016-03-29 DIAGNOSIS — R2681 Unsteadiness on feet: Secondary | ICD-10-CM

## 2016-03-29 DIAGNOSIS — R42 Dizziness and giddiness: Secondary | ICD-10-CM

## 2016-03-30 ENCOUNTER — Telehealth: Payer: Self-pay | Admitting: Physical Therapy

## 2016-03-30 ENCOUNTER — Telehealth: Payer: Self-pay | Admitting: Neurology

## 2016-03-30 DIAGNOSIS — R269 Unspecified abnormalities of gait and mobility: Secondary | ICD-10-CM

## 2016-03-30 DIAGNOSIS — G35 Multiple sclerosis: Secondary | ICD-10-CM

## 2016-03-30 NOTE — Telephone Encounter (Signed)
Returned call to [employer] re: [id number] for Acthar. ID # [id number]. Received approval through 08/06/16. Will also fax confirmation.

## 2016-03-30 NOTE — Telephone Encounter (Signed)
I will write a prescription for rolling walker for the patient.

## 2016-03-30 NOTE — Therapy (Signed)
[facility] [address] [place], [place], [address] Phone: [phone]   Fax:  [phone]  Physical Therapy Treatment  Patient Details  Name: Connie Robertson MRN: [id number] Date of Birth: Apr 14, 1971 Referring Provider: Hennie Duos. Anne Hahn, MD  Encounter Date: 03/29/2016      PT End of Session - 03/30/16 0803    Visit Number 2   Number of Visits 17   Date for PT Re-Evaluation 05/22/06   Authorization Type [employer] G-Code & Progress report   PT Start Time 0811  Pt arrived late to session   PT Stop Time 0850   PT Time Calculation (min) 39 min   Activity Tolerance Other (comment)  Limited by dizziness   Behavior During Therapy [facility] for tasks assessed/performed      Past Medical History:  Diagnosis Date  . Abnormality of gait 11/25/2014  . Chronic daily headache 2003   Love  . Depression   . Memory disorder 02/29/2016  . MS (multiple sclerosis) (HCC) 1992   Dr. Sandria Manly  . Multiple sclerosis (HCC) 11/21/2013  . Neurogenic bladder 11/21/2013    Past Surgical History:  Procedure Laterality Date  . ABLATION COLPOCLESIS  2004  . CESAREAN SECTION    . TUBAL LIGATION      There were no vitals filed for this visit.      Subjective Assessment - 03/30/16 0750    Subjective Pt arrived in w/c today. Pt expresssing anxiety due to recent decline in mobility due to current MS exacerbation. Reports decreased room-spinning dizziness when getting into/OOB, but perceives ongoing imbalance and difficulty walking.   Pertinent History HTN   Limitations Standing;Walking   Patient Stated Goals To get back to previous to exacerbation level including exercise class   Currently in Pain? No/denies                         [facility] Adult PT Treatment/Exercise - 03/30/16 0001      Transfers   Transfers Sit to Stand;Stand to Sit   Sit to Stand 4: Min assist   Sit to Stand Details (indicate cue type and reason)  with RW   Stand to Sit 4: Min assist   Stand to Sit Details with RW   Squat Pivot Transfers 4: Min assist     Ambulation/Gait   Ambulation/Gait --   Ambulation/Gait Assistance --     Neuro Re-ed    Neuro Re-ed Details  Explained and demonstrated compensatory strategies for vestibular impairments (gaze fixation, turning eyes/head then body and use of visual spotting during transfers). Pt gave effective return demo but did require verbal cueing. Will likely need reinforcement.                PT Education - 03/30/16 0753    Education provided Yes   Education Details Vestibular impairments: central impairments associated with MS as well as BPPV. Explained the nature of BPPV with emphasis on cupulolithiasis, treatment, and what to expect after today. Educated pt on compensatory strategies for central vestib impairments. Provided education (verbal and written) on resources offered by the [facility] MS Society.   Person(s) Educated Patient   Methods Explanation;Demonstration;Handout;Verbal cues   Comprehension Verbalized understanding;Need further instruction          PT Short Term Goals - 03/22/16 1552      PT SHORT TERM GOAL #1   Title Patient verbalizes understanding of energy conservation with activities with MS. (Target Date: 04/21/2016)  Time 4   Period Weeks   Status New     PT SHORT TERM GOAL #2   Title Patient ambulates 300' with single point cane modified independent. (Target Date: 04/21/2016)   Time 4   Period Weeks   Status New     PT SHORT TERM GOAL #3   Title Patient negotiates ramps, curbs and stairs (1 rail) with single point cane with supervision. (Target Date: 04/21/2016)   Time 4   Period Weeks   Status New     PT SHORT TERM GOAL #4   Title Patient reaches 10" without balance loss. (Target Date: 04/21/2016)   Time 4   Period Weeks   Status New     PT SHORT TERM GOAL #5   Title Patient reports 25% improvement in vertigo with sit to stand and supine to  sit. (Target Date: 04/21/2016)   Time 4   Period Weeks   Status New           PT Long Term Goals - 03/22/16 1546      PT LONG TERM GOAL #1   Title Berg Balance >36/56 to indicate lower fall risk. (Target Date: 05/19/2016)   Time 8   Period Weeks   Status New     PT LONG TERM GOAL #2   Title Patient ambulates 500' including grass, paved surfaces without device modified independent. (Target Date: 05/19/2016)   Time 8   Period Weeks   Status New     PT LONG TERM GOAL #3   Title Patient negotiates ramps, curbs & stairs (1 rail) without device modified independent. (Target Date: 05/19/2016)   Time 8   Period Weeks   Status New     PT LONG TERM GOAL #4   Title Timed Up-Go <12 seconds without device independently. (Target Date: 05/19/2016)   Time 8   Period Weeks   Status New     PT LONG TERM GOAL #5   Title Patient reports 50% improvement in dizziness with sit to/from stand, supine to/from sit and rolling in bed. (Target Date: 05/19/2016)   Time 8   Period Weeks   Status New     Additional Long Term Goals   Additional Long Term Goals Yes     PT LONG TERM GOAL #6   Title Patient verbalizes understanding of energy conservation & exercising with MS / independent in HEP - ongoing fitness plan. (Target Date: 05/19/2016)   Time 8   Period Weeks               Plan - 03/30/16 8938    Clinical Impression Statement Skilled session focused on assessing vestibular/oculomotor impairments, reassessing for BPPV, and on providing patient education on BPPV (nature, treatment, and what to expect). Vestibular assessment reveals the following: saccadic smooth pursuits, L gaze-evoked nystagmus (very symptomatic), impaired convergence, and impaired VOR. Vestibular assessment somewhat limited by significant symptoms with eye/head movement. Reassessment of R Sidelying Test reveals R upbeating, torsional nystagmus x30 seconds (until position changed) accompanied by vertigo. Based on  findings, unable to rule out R posterior cupulolithiasis, vestibular hypofunction, and impaired visual-vestibular integration. Pt elected to forgo treatment for BPPV during this session; planned to perform R PC Semont maneuver at next session (8/25).   Rehab Potential Good   PT Frequency 2x / week   PT Duration 8 weeks   PT Treatment/Interventions ADLs/Self Care Home Management;DME Instruction;Gait training;Stair training;Functional mobility training;Therapeutic activities;Therapeutic exercise;Balance training;Neuromuscular re-education;Patient/family education;Vestibular;Canalith Repostioning   PT Next Visit Plan  Perform R PC Semont Maneuver. Educate pt on habituation (if tolerated).   Consulted and Agree with Plan of Care Patient      Patient will benefit from skilled therapeutic intervention in order to improve the following deficits and impairments:  Abnormal gait, Decreased activity tolerance, Decreased balance, Decreased knowledge of use of DME, Decreased mobility, Dizziness  Visit Diagnosis: Other abnormalities of gait and mobility  Unsteadiness on feet  Dizziness and giddiness  BPPV (benign paroxysmal positional vertigo), bilateral     Problem List Patient Active Problem List   Diagnosis Date Noted  . Diplopia 02/29/2016  . Memory disorder 02/29/2016  . Abnormality of gait 11/25/2014  . Multiple sclerosis (HCC) 11/21/2013  . Neurogenic bladder 11/21/2013  . Headache, tension type, chronic 11/17/2011  . Hot flashes 11/17/2011  . Essential hypertension, benign 09/20/2011  . Other screening mammogram 08/18/2011  . Routine general medical examination at a health care facility 08/18/2011    Jorje GuildBlair Ikea Demicco, PT, DPT [facility] [address] [place], [place], [address] Phone: [phone]   Fax:  [phone] 03/30/16, 8:20 AM  Name: Marlise Evesatricia Fritsche MRN: [id number] Date of Birth: [birth year]

## 2016-03-30 NOTE — Telephone Encounter (Signed)
Dr. Anne Hahn,  Vladimir Faster and I have been seeing Connie Robertson for PT at outpatient neuro. The patient would benefit from having a personal rolling walker to prevent falls.  If you agree, please place an order for a rolling walker.  Thanks so much,  Jorje Guild, PT, DPT [facility] [address] [place], [place], [address] Phone: [phone]   Fax:  [phone] 03/30/16, 8:49 AM

## 2016-03-30 NOTE — Telephone Encounter (Signed)
[facility] [phone] called regarding PA for corticotropin (HP ACTHAR) 80 UNIT/ML injectable gel Reference# [id number].

## 2016-03-31 ENCOUNTER — Ambulatory Visit: Payer: Medicare Other | Admitting: Physical Therapy

## 2016-03-31 DIAGNOSIS — R2681 Unsteadiness on feet: Secondary | ICD-10-CM

## 2016-03-31 DIAGNOSIS — R42 Dizziness and giddiness: Secondary | ICD-10-CM

## 2016-03-31 DIAGNOSIS — R2689 Other abnormalities of gait and mobility: Secondary | ICD-10-CM | POA: Diagnosis not present

## 2016-03-31 DIAGNOSIS — H8113 Benign paroxysmal vertigo, bilateral: Secondary | ICD-10-CM

## 2016-03-31 NOTE — Patient Instructions (Addendum)
Tip Card 1.The goal of habituation training is to assist in decreasing symptoms of vertigo, dizziness, or nausea provoked by specific head and body motions. 2.These exercises may initially increase symptoms; however, be persistent and work through symptoms. With repetition and time, the exercises will assist in reducing or eliminating symptoms. 3.Exercises should be stopped and discussed with the therapist if you experience any of the following: - Sudden change or fluctuation in hearing - New onset of ringing in the ears, or increase in current intensity - Any fluid discharge from the ear - Severe pain in neck or back - Extreme nausea  Copyright  VHI. All rights reserved.  Sit to Side-Lying   Sit on edge of bed. Lie down onto the right side and hold until dizziness stops, plus 20 seconds.  Return to sitting and wait until dizziness stops, plus 20 seconds.  Repeat to the left side. Repeat sequence 5 times per session. Do 2 sessions per day.  Overview of Fatigue (from [employer] site) Fatigue is one of the most common symptoms of MS, occurring in about 80 percent of people. It can significantly interfere with a person's ability to function at home and work, and is one of the primary causes of early departure from the workforce. Fatigue may be the most prominent symptom in a person who otherwise has minimal activity limitations. The cause of MS fatigue is currently unknown. Ongoing studies seek an objective test that can be used as a marker for fatigue, and for precise ways to measure it. Some people with MS say that family members, friends, co-workers or employers sometimes misinterpret their fatigue and think the person is depressed or just not trying hard enough. Fatigue & lassitude Several different kinds of fatigue occur in MS. For example, people who have bladder dysfunction (producing night-time awakenings) or nocturnal muscle spasms, may be sleep deprived and suffer from fatigue  as a result. People who are depressed may also suffer fatigue. And anyone who needs to expend considerable effort just to accomplish daily tasks (e.g., dressing, brushing teeth, bathing, preparing meals) may suffer from additional fatigue as a result.  In addition to these sources of fatigue, there is another kind of fatigue - referred to as lassitude - that is unique to people with MS. Researchers are beginning to outline the characteristics of this so-called "MS fatigue" that make it different from fatigue experienced by persons without MS. . Generally occurs on a daily basis . May occur early in the morning, even after a restful night's sleep . Tends to worsen as the day progresses . Tends to be aggravated by heat and humidity . Comes on easily and suddenly . Is generally more severe than normal fatigue . Is more likely to interfere with daily responsibilities MS-related fatigue does not appear to be directly correlated with either depression or the degree of physical impairment. Managing fatigue Because fatigue can also be caused by treatable medical conditions such as depression, thyroid disease or anemia, may occur as a side effect of various medications or be the result of inactivity, persons with MS should consult a physician if fatigue becomes a problem. A comprehensive evaluation can help identify the factors contributing to fatigue and make it possible to develop an approach suited to the individual's needs. Options for dealing with fatigue include: . Occupational therapy to simplify tasks at work and home. Marland Kitchen Physical therapy to learn energy-saving ways of walking (with or without assistive devices) and performing other daily tasks, and  to develop a regular exercise program. . Sleep regulation, which might involve treating other MS symptoms that interfere with sleep (e.g., spasticity, urinary problems) and using sleep medications on a short-term basis. Marland Kitchen. Psychological interventions, such  as stress management, relaxation training, membership in a support group, or psychotherapy. . Heat management - strategies to avoid overheating and to cool down. . Medications - amantadine hydrochloride (Symmetrel) and modafinil (Provigil) - are the most commonly prescribed. While neither is approved specifically by the U.[place] Food and Drug Administration (FDA) for the treatment of MS-related fatigue, each has demonstrated some benefit in clinical trials. The most recent trial of modafinil, however, reported no difference between modafinil and placebo in relieving fatigue.

## 2016-03-31 NOTE — Therapy (Signed)
[facility] [address] [place], [place], [address] Phone: [phone]   Fax:  [phone]  Physical Therapy Treatment  Patient Details  Name: Connie Robertson MRN: [id number] Date of Birth: 1970-12-08 Referring Provider: Hennie Duosharles K. Anne HahnWillis, MD  Encounter Date: 03/31/2016      PT End of Session - 03/31/16 1255    Visit Number 3   Number of Visits 17   Date for PT Re-Evaluation 05/22/06   Authorization Type [employer] Medicare G-Code & Progress report   PT Start Time 1149   PT Stop Time 1230   PT Time Calculation (min) 41 min   Activity Tolerance Other (comment)  Limited by dizziness   Behavior During Therapy WFL for tasks assessed/performed      Past Medical History:  Diagnosis Date  . Abnormality of gait 11/25/2014  . Chronic daily headache 2003   Love  . Depression   . Memory disorder 02/29/2016  . MS (multiple sclerosis) (HCC) 1992   Dr. Sandria ManlyLove  . Multiple sclerosis (HCC) 11/21/2013  . Neurogenic bladder 11/21/2013    Past Surgical History:  Procedure Laterality Date  . ABLATION COLPOCLESIS  2004  . CESAREAN SECTION    . TUBAL LIGATION      There were no vitals filed for this visit.      Subjective Assessment - 03/31/16 1244    Subjective Pt arrived to session but attempted to cancel this appt due to fear of canalith repositioning maneuver impacting function. Pt states, "My daughter starts school on Monday and she has dance, and I need to be there for her."   Pertinent History HTN   Limitations Standing;Walking   Patient Stated Goals To get back to previous to exacerbation level including exercise class   Currently in Pain? No/denies  No c/o pain                Vestibular Assessment - 03/31/16 0001      Symptom Behavior   Type of Dizziness Imbalance   Frequency of Dizziness daily   Aggravating Factors Looking up to the ceiling;Moving eyes;Turning body quickly;Turning head quickly;Turning  head sideways;Supine to sit;Sit to stand;Rolling to right;Rolling to left;Forward bending   Relieving Factors Head stationary;Dark room;Rest     Positional Testing   Sidelying Test Sidelying Right;Sidelying Left     Sidelying Right   Sidelying Right Duration Approx 45 seconds   Sidelying Right Symptoms Upbeat, right rotatory nystagmus     Sidelying Left   Sidelying Left Duration NA   Sidelying Left Symptoms No nystagmus                 [facility] Adult PT Treatment/Exercise - 03/31/16 0001      Transfers   Transfers Sit to Stand;Stand to Sit   Sit to Stand 5: Supervision   Sit to Stand Details (indicate cue type and reason) with RW; cueing for hand placement, foot placement with effective return demo   Stand to Sit 5: Supervision   Stand to Sit Details with RW; cueing for hand placement     Ambulation/Gait   Ambulation/Gait Yes   Ambulation/Gait Assistance 5: Supervision   Ambulation Distance (Feet) 130 Feet  +60'   Assistive device Rolling walker   Gait Pattern Step-through pattern;Decreased arm swing - left;Decreased step length - right;Decreased stride length;Decreased weight shift to left;Trunk flexed;Wide base of support   Ambulation Surface Level;Indoor;Outdoor;Paved     Self-Care   Self-Care Other Self-Care Comments   Other Self-Care Comments  MD for RW received; therefore, provided pt with personal RW from facility supply closet. Adjusted/fitted walker to pt and added tennis balls to facilitate safety with negotiation of door sills.         Vestibular Treatment/Exercise - 03/31/16 0001      Vestibular Treatment/Exercise   Vestibular Treatment Provided Habituation   Habituation Exercises Gari Crown Daroff   Number of Reps  2   Symptom Description  Sit > R sidelying: duration and severity of symptoms/nystagmus decreased on second trial. R sidelying > sit:: initially with 8/10 dizziness, > 60 seconds required for symptoms to settle; but on second  trial, symptoms 6/10 and lasted ~20-30 seconds. Sit > L sidelying: minimal motion sensitivity on initial trial; no symptoms on second trial.                PT Education - 03/31/16 1240    Education provided Yes   Education Details Provided pt with personal RW from facility supply closet and fit walker to pt; see Self Care tab for additional details. Addressed pt questions regarding MS-related fatigue; see Pt Instructions for information provided from [facility] MS Society web site. Pt with significant anxiety regarding canalith repositioning manuevers to treat BPPV. Therefore, educated pt on the role of habituation in treating vertigo. Explained and provided handout on Austin Miles for habituation.    Person(s) Educated Patient   Methods Explanation;Demonstration;Handout;Verbal cues;Tactile cues   Comprehension Verbalized understanding;Returned demonstration;Need further instruction  May need to review at future session          PT Short Term Goals - 03/22/16 1552      PT SHORT TERM GOAL #1   Title Patient verbalizes understanding of energy conservation with activities with MS. (Target Date: 04/21/2016)   Time 4   Period Weeks   Status New     PT SHORT TERM GOAL #2   Title Patient ambulates 300' with single point cane modified independent. (Target Date: 04/21/2016)   Time 4   Period Weeks   Status New     PT SHORT TERM GOAL #3   Title Patient negotiates ramps, curbs and stairs (1 rail) with single point cane with supervision. (Target Date: 04/21/2016)   Time 4   Period Weeks   Status New     PT SHORT TERM GOAL #4   Title Patient reaches 10" without balance loss. (Target Date: 04/21/2016)   Time 4   Period Weeks   Status New     PT SHORT TERM GOAL #5   Title Patient reports 25% improvement in vertigo with sit to stand and supine to sit. (Target Date: 04/21/2016)   Time 4   Period Weeks   Status New           PT Long Term Goals - 03/22/16 1546      PT LONG TERM  GOAL #1   Title Berg Balance >36/56 to indicate lower fall risk. (Target Date: 05/19/2016)   Time 8   Period Weeks   Status New     PT LONG TERM GOAL #2   Title Patient ambulates 500' including grass, paved surfaces without device modified independent. (Target Date: 05/19/2016)   Time 8   Period Weeks   Status New     PT LONG TERM GOAL #3   Title Patient negotiates ramps, curbs & stairs (1 rail) without device modified independent. (Target Date: 05/19/2016)   Time 8   Period Weeks   Status New  PT LONG TERM GOAL #4   Title Timed Up-Go <12 seconds without device independently. (Target Date: 05/19/2016)   Time 8   Period Weeks   Status New     PT LONG TERM GOAL #5   Title Patient reports 50% improvement in dizziness with sit to/from stand, supine to/from sit and rolling in bed. (Target Date: 05/19/2016)   Time 8   Period Weeks   Status New     Additional Long Term Goals   Additional Long Term Goals Yes     PT LONG TERM GOAL #6   Title Patient verbalizes understanding of energy conservation & exercising with MS / independent in HEP - ongoing fitness plan. (Target Date: 05/19/2016)   Time 8   Period Weeks               Plan - 03/31/16 1256    Clinical Impression Statement Pt arrived to PT session, then attempted to cancel due to significant anxiety regarding treatment for vertigo. With education and coaxing, pt did agree to session. Focused on habituation Austin Miles); pt responded favorably, as duration//intensity of nystagmus and symptoms decreased. MD order for RW received; therefore provided pt with personal RW from facility supply closet.   Rehab Potential Good   PT Frequency 2x / week   PT Duration 8 weeks   PT Treatment/Interventions ADLs/Self Care Home Management;DME Instruction;Gait training;Stair training;Functional mobility training;Therapeutic activities;Therapeutic exercise;Balance training;Neuromuscular re-education;Patient/family  education;Vestibular;Canalith Repostioning   PT Next Visit Plan Briefly review HEP for habituation (make sure head positioning correct). Strengthening/balance and gait.   PT Home Exercise Plan Please add seated head turns to HEP (for habituation; make sur dizziness doesn't increase by more than 2/10 points).    Consulted and Agree with Plan of Care Patient      Patient will benefit from skilled therapeutic intervention in order to improve the following deficits and impairments:  Abnormal gait, Decreased activity tolerance, Decreased balance, Decreased knowledge of use of DME, Decreased mobility, Dizziness  Visit Diagnosis: Other abnormalities of gait and mobility  Unsteadiness on feet  Dizziness and giddiness  BPPV (benign paroxysmal positional vertigo), bilateral     Problem List Patient Active Problem List   Diagnosis Date Noted  . Diplopia 02/29/2016  . Memory disorder 02/29/2016  . Abnormality of gait 11/25/2014  . Multiple sclerosis (HCC) 11/21/2013  . Neurogenic bladder 11/21/2013  . Headache, tension type, chronic 11/17/2011  . Hot flashes 11/17/2011  . Essential hypertension, benign 09/20/2011  . Other screening mammogram 08/18/2011  . Routine general medical examination at a health care facility 08/18/2011   Jorje Guild, PT, DPT [facility] [address] [place], [place], [address] Phone: [phone]   Fax:  [phone] 03/31/16, 1:08 PM  Name: Connie Robertson MRN: [id number] Date of Birth: 04-Jan-1971

## 2016-04-03 ENCOUNTER — Telehealth: Payer: Self-pay | Admitting: Neurology

## 2016-04-03 NOTE — Telephone Encounter (Signed)
Returned pt TC. Let her know that [facility] has been approved and that [facility] said that they would contact her later today prior to shipment of medication. Pt may also call [facility] @ [phone]. Call was dropped but pt called back, said that she would have her mom call back shortly in order to write down pharmacy tele #. C/o right hand pain interfering w/ her ability to write, also having a hard time finding a pen. Pt also to call back if discomfort is not improved w/ use of OTC pain relievers such as Tylenol or ibuprofen.

## 2016-04-03 NOTE — Telephone Encounter (Signed)
Called [facility] to follow-up. Received confirmation that they did get rx and that PA was approved for new medication on Friday. Said that they sent all info to [facility]. Also spoke to [facility] who said that they had not yet received PA approval. During 3-way call w/ [facility] and [facility], was able to get 5 days of Acthar approved and shipment to pt initiated. [facility] agreed to contact pt by phone as well.

## 2016-04-03 NOTE — Telephone Encounter (Signed)
Pt called in to give correct phone number for her. I looked at her records and what we have is corrects. I gave her phone # to [facility] again.

## 2016-04-03 NOTE — Telephone Encounter (Signed)
Pt came by the office earlier this afternoon saying that she was confused about the pharmacy and her niew medication. Called [facility] w/ pt sitting next to phone. [facility] reported that since rx was approved for 5 rather than 10 days, the pharmacist would have to re-check the rx w/ the qty requested. Verified pt's telephone number and let pt know that [facility] would have pharmacy re-check rx and then call her w/in a few minutes to confirm shipment of the med to her home. She verbalized understanding, placed her cell phone w/in reach and agreed to answer when pharmacy called to give her OK for Acthar shipment. Also spoke to [place] in [place] and she is still waiting on approval of Tysabri. Hopefully, pt will be able to start MS meds soon. She has recently had more cognitive difficulties.

## 2016-04-03 NOTE — Telephone Encounter (Signed)
Pt called back and was given phone # to [facility].

## 2016-04-03 NOTE — Telephone Encounter (Signed)
Patient called to advise, "she is having MS exacerbation, numbness in right hand is starting to hurt, doesn't want this to become a permanent fixture,wonders if Dr. Anne Hahn could give her something until Donnamarie Poag is approved".

## 2016-04-03 NOTE — Telephone Encounter (Signed)
If/when pt or her mother calls back, please give tele # for [facility] pharmacy [phone]. Thanks!

## 2016-04-04 ENCOUNTER — Ambulatory Visit: Payer: Medicare Other | Admitting: Physical Therapy

## 2016-04-05 NOTE — Telephone Encounter (Signed)
Returned call to patient - she has heard back from the [employer] and they should be able to send a nurse out to her home today.

## 2016-04-05 NOTE — Telephone Encounter (Signed)
Pt called in after receiving medication. She was told the MS society is to send a nurse out to administer the medication however , it will not be until tomorrow before she gets a call back. he wants to know  if she can come in to have the nurse here do it. Please call and advise [phone]

## 2016-04-07 ENCOUNTER — Ambulatory Visit: Payer: Medicare Other | Attending: Neurology | Admitting: Physical Therapy

## 2016-04-07 DIAGNOSIS — R42 Dizziness and giddiness: Secondary | ICD-10-CM | POA: Insufficient documentation

## 2016-04-07 DIAGNOSIS — R2681 Unsteadiness on feet: Secondary | ICD-10-CM | POA: Insufficient documentation

## 2016-04-07 DIAGNOSIS — H8113 Benign paroxysmal vertigo, bilateral: Secondary | ICD-10-CM | POA: Insufficient documentation

## 2016-04-07 DIAGNOSIS — R2689 Other abnormalities of gait and mobility: Secondary | ICD-10-CM | POA: Insufficient documentation

## 2016-04-07 DIAGNOSIS — R269 Unspecified abnormalities of gait and mobility: Secondary | ICD-10-CM | POA: Insufficient documentation

## 2016-04-12 ENCOUNTER — Encounter: Payer: Self-pay | Admitting: Physical Therapy

## 2016-04-12 ENCOUNTER — Telehealth: Payer: Self-pay

## 2016-04-12 ENCOUNTER — Ambulatory Visit: Payer: Medicare Other | Admitting: Physical Therapy

## 2016-04-12 DIAGNOSIS — R2689 Other abnormalities of gait and mobility: Secondary | ICD-10-CM | POA: Diagnosis not present

## 2016-04-12 DIAGNOSIS — H8113 Benign paroxysmal vertigo, bilateral: Secondary | ICD-10-CM | POA: Diagnosis present

## 2016-04-12 DIAGNOSIS — R42 Dizziness and giddiness: Secondary | ICD-10-CM | POA: Diagnosis present

## 2016-04-12 DIAGNOSIS — R2681 Unsteadiness on feet: Secondary | ICD-10-CM

## 2016-04-12 DIAGNOSIS — R269 Unspecified abnormalities of gait and mobility: Secondary | ICD-10-CM | POA: Diagnosis present

## 2016-04-12 NOTE — Therapy (Signed)
[facility] [address] [place], [place], [address] Phone: [phone]   Fax:  [phone]  Physical Therapy Treatment  Patient Details  Name: Connie Robertson MRN: [id number] Date of Birth: 1971-03-02 Referring Provider: Hennie Duos. Anne Hahn, MD  Encounter Date: 04/12/2016      PT End of Session - 04/12/16 1315    Visit Number 4   Number of Visits 17   Date for PT Re-Evaluation 05/22/06   Authorization Type [employer] Medicare G-Code & Progress report   PT Start Time 1315   PT Stop Time 1400   PT Time Calculation (min) 45 min   Activity Tolerance Patient tolerated treatment well  Limited by dizziness   Behavior During Therapy WFL for tasks assessed/performed      Past Medical History:  Diagnosis Date  . Abnormality of gait 11/25/2014  . Chronic daily headache 2003   Love  . Depression   . Memory disorder 02/29/2016  . MS (multiple sclerosis) (HCC) 1992   Dr. Sandria Manly  . Multiple sclerosis (HCC) 11/21/2013  . Neurogenic bladder 11/21/2013    Past Surgical History:  Procedure Laterality Date  . ABLATION COLPOCLESIS  2004  . CESAREAN SECTION    . TUBAL LIGATION      There were no vitals filed for this visit.      Subjective Assessment - 04/12/16 1315    Subjective Pt reports having a "hard time" unlocking the RW that was issued to her in the previous session and has a hard time placing it in the car which is why she did not bring it to therapy today.  She reports using the RW while at home with no issues other than unlocking it to fold it up.   Pertinent History HTN   Limitations Standing;Walking   Patient Stated Goals To get back to previous to exacerbation level including exercise class   Currently in Pain? Yes  HA   Pain Score 8    Pain Location Head   Pain Descriptors / Indicators Aching   Pain Onset Today   Pain Frequency Intermittent   Multiple Pain Sites No        Objective:  Gait Training: Pt  ambulated 100' from lobby to gym with single point cane with minA for balance issues. Pt ambulated 120' with standard RW with supervision with step -thru pattern with flexed posture. PT demo, instructed in proper use of rollator walker including wheel lock safety. Pt ambulated 250' with rollator walker with supervision & verbal cues on posture and position within rollator. Pt performed sit to/from stand mat table to rollator and stand-pivot to sit on rollator walker with supervision.  PT demo, instructed in proper technique to negotiate ramps & curbs with standard RW & rollator walker. Pt negotiated ramp & curb with std RW 1 rep and with rollator walker 1 rep with min guard and verbal cues. PT demo how to use rollator walker to transport items with rollator walker. Pt able to move basket of laundry 50' safely with supervision, verbal cues on safety.  Pt performed gaze stabilization exercises including eyes focused on object while moving head up/down and side to side.  Pt also maintained a neutral head position while shifting her eyes side to side focusing on an object in each direction.  Exercises were performed in order to decr dizziness experienced when moving her head and eyes throughout the day.  Pt initially was shaking her head side to side without focusing her eyes on  an object which exacerbated her vertigo/dizzy sx.                          PT Education - 04/12/16 1315    Education provided Yes   Education Details Pt educated for use of rolator walker as well as RW in order for pt to determine which AD she prefers.  Pt also educated in proper use of AD when getting in and out of her car (how to fold it up and where to put it in the car) and how to properly perform gaze stabilization exercises.   Person(s) Educated Patient   Methods Explanation;Demonstration;Verbal cues   Comprehension Verbalized understanding;Returned demonstration          PT Short Term Goals - 03/22/16  1552      PT SHORT TERM GOAL #1   Title Patient verbalizes understanding of energy conservation with activities with MS. (Target Date: 04/21/2016)   Time 4   Period Weeks   Status New     PT SHORT TERM GOAL #2   Title Patient ambulates 300' with single point cane modified independent. (Target Date: 04/21/2016)   Time 4   Period Weeks   Status New     PT SHORT TERM GOAL #3   Title Patient negotiates ramps, curbs and stairs (1 rail) with single point cane with supervision. (Target Date: 04/21/2016)   Time 4   Period Weeks   Status New     PT SHORT TERM GOAL #4   Title Patient reaches 10" without balance loss. (Target Date: 04/21/2016)   Time 4   Period Weeks   Status New     PT SHORT TERM GOAL #5   Title Patient reports 25% improvement in vertigo with sit to stand and supine to sit. (Target Date: 04/21/2016)   Time 4   Period Weeks   Status New           PT Long Term Goals - 03/22/16 1546      PT LONG TERM GOAL #1   Title Berg Balance >36/56 to indicate lower fall risk. (Target Date: 05/19/2016)   Time 8   Period Weeks   Status New     PT LONG TERM GOAL #2   Title Patient ambulates 500' including grass, paved surfaces without device modified independent. (Target Date: 05/19/2016)   Time 8   Period Weeks   Status New     PT LONG TERM GOAL #3   Title Patient negotiates ramps, curbs & stairs (1 rail) without device modified independent. (Target Date: 05/19/2016)   Time 8   Period Weeks   Status New     PT LONG TERM GOAL #4   Title Timed Up-Go <12 seconds without device independently. (Target Date: 05/19/2016)   Time 8   Period Weeks   Status New     PT LONG TERM GOAL #5   Title Patient reports 50% improvement in dizziness with sit to/from stand, supine to/from sit and rolling in bed. (Target Date: 05/19/2016)   Time 8   Period Weeks   Status New     Additional Long Term Goals   Additional Long Term Goals Yes     PT LONG TERM GOAL #6   Title Patient  verbalizes understanding of energy conservation & exercising with MS / independent in HEP - ongoing fitness plan. (Target Date: 05/19/2016)   Time 8   Period Weeks  Plan - 04/12/16 1315    Clinical Impression Statement Pt was not yet educated on how to properly place RW in her car in order to transfer it to therapy and other places in the community.  Because of the safety issue, PT educated her on how to manage the RW in order to have it with her throughout the day.  Pt also was educated on use of a rolater for incr mobility to incr her functional status.  She continues to present with decr strength globally and verbalizes concern for her health status and fear due to this being her first exacerbation.  Pt would continue from skilled therapy to address her deficits and education to decr fear.   Rehab Potential Good   Clinical Impairments Affecting Rehab Potential Fear, first exacerbation of MS   PT Frequency 2x / week   PT Duration 8 weeks   PT Treatment/Interventions ADLs/Self Care Home Management;DME Instruction;Gait training;Stair training;Functional mobility training;Therapeutic activities;Therapeutic exercise;Balance training;Neuromuscular re-education;Patient/family education;Vestibular;Canalith Repostioning   PT Next Visit Plan Switch RW to allow pt to be able to unlock the device when folding it up for transportation and determine whether she would benefit more from the RW or rolator.   PT Home Exercise Plan Please add seated head turns to HEP (for habituation; make sur dizziness doesn't increase by more than 2/10 points).    Consulted and Agree with Plan of Care Patient      Patient will benefit from skilled therapeutic intervention in order to improve the following deficits and impairments:  Abnormal gait, Decreased activity tolerance, Decreased balance, Decreased knowledge of use of DME, Decreased mobility, Dizziness  Visit Diagnosis: Other abnormalities of gait  and mobility  Unsteadiness on feet     Problem List Patient Active Problem List   Diagnosis Date Noted  . Diplopia 02/29/2016  . Memory disorder 02/29/2016  . Abnormality of gait 11/25/2014  . Multiple sclerosis (HCC) 11/21/2013  . Neurogenic bladder 11/21/2013  . Headache, tension type, chronic 11/17/2011  . Hot flashes 11/17/2011  . Essential hypertension, benign 09/20/2011  . Other screening mammogram 08/18/2011  . Routine general medical examination at a health care facility 08/18/2011    Vilinda Flake, SPT 04/12/2016, 4:52 PM  Vladimir Faster, PT, DPT PT Specializing in Prosthetics & Orthotics 04/12/16 8:39 PM Phone:  [phone]  Fax:  [phone] [facility] [address] [place], [place] [address]  [facility] [address] [place], [place], [address] Phone: [phone]   Fax:  [phone]  Name: Jenavi Beedle MRN: [id number] Date of Birth: 1971/01/07

## 2016-04-12 NOTE — Telephone Encounter (Signed)
Pt stopped by the office this morning. Asked for directions on how to change needle to a smaller gauge for Acthar administration. Instructed pt on intramuscular injection. Demonstrated changing the needle on her syringe. She c/o facial twitching and bilateral leg pain so asked about starting Tysabri. York Spaniel that she spoke to her insurance company and they have approved her for the infusions. Spoke to [place] in [place]. She is now waiting on [facility] pharmacy for medication and then will be able to schedule pt for treatment. Pt verbalized understanding.

## 2016-04-13 NOTE — Telephone Encounter (Signed)
Donnamarie Poagysabri [id number] is approved through 08/06/16 under Medicare Part D benefit.

## 2016-04-14 ENCOUNTER — Ambulatory Visit: Payer: Medicare Other | Admitting: Physical Therapy

## 2016-04-14 DIAGNOSIS — R2681 Unsteadiness on feet: Secondary | ICD-10-CM

## 2016-04-14 DIAGNOSIS — R2689 Other abnormalities of gait and mobility: Secondary | ICD-10-CM

## 2016-04-14 DIAGNOSIS — R42 Dizziness and giddiness: Secondary | ICD-10-CM

## 2016-04-16 NOTE — Therapy (Signed)
[facility] [address] [place], [place], [address] Phone: [phone]   Fax:  [phone]  Physical Therapy Treatment  Patient Details  Name: Connie Robertson MRN: [id number] Date of Birth: July 27, 1971 Referring Provider: Hennie Duos. Anne Hahn, MD  Encounter Date: 04/14/2016      PT End of Session - 04/16/16 0730    Visit Number 5   Number of Visits 17   Date for PT Re-Evaluation 05/22/06   Authorization Type [employer] Medicare G-Code & Progress report   PT Start Time 0932   PT Stop Time 1016   PT Time Calculation (min) 44 min      Past Medical History:  Diagnosis Date  . Abnormality of gait 11/25/2014  . Chronic daily headache 2003   Love  . Depression   . Memory disorder 02/29/2016  . MS (multiple sclerosis) (HCC) 1992   Dr. Sandria Manly  . Multiple sclerosis (HCC) 11/21/2013  . Neurogenic bladder 11/21/2013    Past Surgical History:  Procedure Laterality Date  . ABLATION COLPOCLESIS  2004  . CESAREAN SECTION    . TUBAL LIGATION      There were no vitals filed for this visit.      Subjective Assessment - 04/16/16 0727    Subjective Pt using RW today - states it has gotten easier to lock and unlock it: requests not to do the treatment for the vertigo today - states "I have to be able to function for my kids"   Pertinent History HTN   Limitations Standing;Walking   Patient Stated Goals To get back to previous to exacerbation level including exercise class   Currently in Pain? Yes   Pain Score 6    Pain Location Head   Pain Orientation Posterior   Pain Descriptors / Indicators Aching   Pain Type Acute pain   Pain Onset Today   Pain Frequency Intermittent      Neuro Re-ed: vestibular exercises in seated position - x 1 viewing horizontal and vertical x approx. 20 secs each direction due to Limited tolerance due to vertigo; saccades horizontal and vertical x 8-10 reps each direction Seated position - looking at  targets on either side - horizontal and vertical x 10 reps each, progressed this to standing position in corner With CGA - 10 reps each direction with specific targets on R/L sides and on ceiling/floor for improved gaze stabilization  Gait training: rollator used for assistance with ambulation - 120' x 2 reps - with horizontal head turns for environmental scanning as  Tolerated; pt rested after 1st lap of 120'; cues given for posture and step length  TherEx: Scifit level 1.7 x 5" with UE's and LE's - with head turns as tolerated                              PT Short Term Goals - 03/22/16 1552      PT SHORT TERM GOAL #1   Title Patient verbalizes understanding of energy conservation with activities with MS. (Target Date: 04/21/2016)   Time 4   Period Weeks   Status New     PT SHORT TERM GOAL #2   Title Patient ambulates 300' with single point cane modified independent. (Target Date: 04/21/2016)   Time 4   Period Weeks   Status New     PT SHORT TERM GOAL #3   Title Patient negotiates ramps, curbs and stairs (1 rail) with single  point cane with supervision. (Target Date: 04/21/2016)   Time 4   Period Weeks   Status New     PT SHORT TERM GOAL #4   Title Patient reaches 10" without balance loss. (Target Date: 04/21/2016)   Time 4   Period Weeks   Status New     PT SHORT TERM GOAL #5   Title Patient reports 25% improvement in vertigo with sit to stand and supine to sit. (Target Date: 04/21/2016)   Time 4   Period Weeks   Status New           PT Long Term Goals - 03/22/16 1546      PT LONG TERM GOAL #1   Title Berg Balance >36/56 to indicate lower fall risk. (Target Date: 05/19/2016)   Time 8   Period Weeks   Status New     PT LONG TERM GOAL #2   Title Patient ambulates 500' including grass, paved surfaces without device modified independent. (Target Date: 05/19/2016)   Time 8   Period Weeks   Status New     PT LONG TERM GOAL #3   Title  Patient negotiates ramps, curbs & stairs (1 rail) without device modified independent. (Target Date: 05/19/2016)   Time 8   Period Weeks   Status New     PT LONG TERM GOAL #4   Title Timed Up-Go <12 seconds without device independently. (Target Date: 05/19/2016)   Time 8   Period Weeks   Status New     PT LONG TERM GOAL #5   Title Patient reports 50% improvement in dizziness with sit to/from stand, supine to/from sit and rolling in bed. (Target Date: 05/19/2016)   Time 8   Period Weeks   Status New     Additional Long Term Goals   Additional Long Term Goals Yes     PT LONG TERM GOAL #6   Title Patient verbalizes understanding of energy conservation & exercising with MS / independent in HEP - ongoing fitness plan. (Target Date: 05/19/2016)   Time 8   Period Weeks               Plan - 04/16/16 0730    Clinical Impression Statement Pt's primary limitation appears to be vertigo/visual disturbance with head movement; pt's gait with rollator is improved compared to that with use of standard RW but gait speed is very slow with no head turns for environmental scanning   Rehab Potential Good   Clinical Impairments Affecting Rehab Potential Fear, first exacerbation of MS   PT Frequency 2x / week   PT Duration 8 weeks   PT Treatment/Interventions ADLs/Self Care Home Management;DME Instruction;Gait training;Stair training;Functional mobility training;Therapeutic activities;Therapeutic exercise;Balance training;Neuromuscular re-education;Patient/family education;Vestibular;Canalith Repostioning   PT Next Visit Plan Switch RW to allow pt to be able to unlock the device when folding it up for transportation and determine whether she would benefit more from the RW or rolator.  is this still needed?? pt able to lock and unlock on 04-14-16 session; she would definitely benefit from rollator rather than RW which she currently has   PT Home Exercise Plan Please add seated head turns to HEP (for  habituation; make sur dizziness doesn't increase by more than 2/10 points).    Consulted and Agree with Plan of Care Patient      Patient will benefit from skilled therapeutic intervention in order to improve the following deficits and impairments:  Abnormal gait, Decreased activity tolerance, Decreased balance, Decreased knowledge  of use of DME, Decreased mobility, Dizziness  Visit Diagnosis: Other abnormalities of gait and mobility  Dizziness and giddiness  Unsteadiness on feet     Problem List Patient Active Problem List   Diagnosis Date Noted  . Diplopia 02/29/2016  . Memory disorder 02/29/2016  . Abnormality of gait 11/25/2014  . Multiple sclerosis (HCC) 11/21/2013  . Neurogenic bladder 11/21/2013  . Headache, tension type, chronic 11/17/2011  . Hot flashes 11/17/2011  . Essential hypertension, benign 09/20/2011  . Other screening mammogram 08/18/2011  . Routine general medical examination at a health care facility 08/18/2011    Lucelia Lacey, Donavan Burnet, PT 04/16/2016, 7:36 AM  [facility] [address] [place], [place], [address] Phone: [phone]   Fax:  [phone]  Name: Latonya Tauer MRN: [id number] Date of Birth: 09/02/1970

## 2016-04-17 ENCOUNTER — Encounter: Payer: Self-pay | Admitting: Physical Therapy

## 2016-04-17 ENCOUNTER — Ambulatory Visit: Payer: Medicare Other | Admitting: Physical Therapy

## 2016-04-17 DIAGNOSIS — R42 Dizziness and giddiness: Secondary | ICD-10-CM

## 2016-04-17 DIAGNOSIS — R2681 Unsteadiness on feet: Secondary | ICD-10-CM

## 2016-04-17 DIAGNOSIS — R2689 Other abnormalities of gait and mobility: Secondary | ICD-10-CM

## 2016-04-17 NOTE — Therapy (Signed)
[facility] [address] [place], [place], [address] Phone: [phone]   Fax:  [phone]  Physical Therapy Treatment  Patient Details  Name: Connie Robertson MRN: [id number] Date of Birth: Nov 09, 1970 Referring Provider: Elon Alas. Jannifer Franklin, MD  Encounter Date: 04/17/2016      PT End of Session - 04/17/16 1241    Visit Number 6   Number of Visits 17   Date for PT Re-Evaluation 05/22/06   Authorization Type [employer] Medicare G-Code & Progress report   PT Start Time 1232   PT Stop Time 1315   PT Time Calculation (min) 43 min   Activity Tolerance Patient tolerated treatment well   Behavior During Therapy WFL for tasks assessed/performed      Past Medical History:  Diagnosis Date  . Abnormality of gait 11/25/2014  . Chronic daily headache 2003   Love  . Depression   . Memory disorder 02/29/2016  . MS (multiple sclerosis) (HCC) 1992   Dr. Erling Cruz  . Multiple sclerosis ([place]) 11/21/2013  . Neurogenic bladder 11/21/2013    Past Surgical History:  Procedure Laterality Date  . ABLATION COLPOCLESIS  2004  . CESAREAN SECTION    . TUBAL LIGATION      There were no vitals filed for this visit.      Subjective Assessment - 04/17/16 1238    Subjective A little improvement in dizziness, rates it 6-7/10 today. No falls.    Pertinent History HTN   Limitations Standing;Walking   Patient Stated Goals To get back to previous to exacerbation level including exercise class   Currently in Pain? Yes   Pain Score 8   with walking only, 0/10 sitting   Pain Location Leg   Pain Orientation Right;Left   Pain Descriptors / Indicators Heaviness   Pain Type Chronic pain   Pain Onset More than a month ago   Pain Frequency Intermittent   Aggravating Factors  walking   Pain Relieving Factors resting, sitting           [facility] Adult PT Treatment/Exercise - 04/17/16 1248      Transfers   Transfers Sit to Stand;Stand to Sit   Sit  to Stand 5: Supervision;With upper extremity assist;From bed   Sit to Stand Details Verbal cues for safe use of DME/AE;Verbal cues for precautions/safety   Stand to Sit 5: Supervision;With upper extremity assist;To bed   Stand to Sit Details (indicate cue type and reason) Verbal cues for precautions/safety;Verbal cues for safe use of DME/AE     Ambulation/Gait   Ambulation/Gait Yes   Ambulation/Gait Assistance 5: Supervision;4: Min guard   Ambulation/Gait Assistance Details cues on hand placement on brakes of rollator for safety   Ambulation Distance (Feet) 220 Feet  x2   Assistive device Rollator   Gait Pattern Step-through pattern;Decreased stride length;Wide base of support   Ambulation Surface Level;Indoor   Ramp 4: Min assist   Ramp Details (indicate cue type and reason) with rollator, cues on sequencing and technique   Curb 4: Min assist   Curb Details (indicate cue type and reason) with rollator, cues on sequencing and technique     Self-Care   Other Self-Care Comments  energy conservation reviewed, pt able to recall most of them and has implemented most as well;AD preference established after discussion with pt and comparrison of RW vs rollator. Pt preferres rollator at this time.              PT Short Term Goals -  04/17/16 1245      PT SHORT TERM GOAL #1   Title Patient verbalizes understanding of energy conservation with activities with MS. (Target Date: 04/21/2016)   Baseline met on 04/17/16   Time --   Period --   Status Achieved     PT SHORT TERM GOAL #2   Title Patient ambulates 300' with single point cane modified independent. (Target Date: 04/21/2016)   Baseline 04/17/16: pt able to ambulate up to 220 feet consecutively with rollator (no longer trying for cane use)   Time --   Period --   Status Partially Met     PT SHORT TERM GOAL #3   Title Patient negotiates ramps, curbs and stairs (1 rail) with single point cane with supervision. (Target Date: 04/21/2016)    Baseline 04/17/16: min guard to min assist with rollator on ramp and curb; NEED TO ADDRESS STAIRS   Time --   Period --   Status On-going     PT SHORT TERM GOAL #4   Title Patient reaches 10" without balance loss. (Target Date: 04/21/2016)   Time --   Period --   Status On-going     PT SHORT TERM GOAL #5   Title Patient reports 25% improvement in vertigo with sit to stand and supine to sit. (Target Date: 04/21/2016)   Time 4   Period Weeks   Status On-going           PT Long Term Goals - 03/22/16 1546      PT LONG TERM GOAL #1   Title Berg Balance >36/56 to indicate lower fall risk. (Target Date: 05/19/2016)   Time 8   Period Weeks   Status New     PT LONG TERM GOAL #2   Title Patient ambulates 500' including grass, paved surfaces without device modified independent. (Target Date: 05/19/2016)   Time 8   Period Weeks   Status New     PT LONG TERM GOAL #3   Title Patient negotiates ramps, curbs & stairs (1 rail) without device modified independent. (Target Date: 05/19/2016)   Time 8   Period Weeks   Status New     PT LONG TERM GOAL #4   Title Timed Up-Go <12 seconds without device independently. (Target Date: 05/19/2016)   Time 8   Period Weeks   Status New     PT LONG TERM GOAL #5   Title Patient reports 50% improvement in dizziness with sit to/from stand, supine to/from sit and rolling in bed. (Target Date: 05/19/2016)   Time 8   Period Weeks   Status New     Additional Long Term Goals   Additional Long Term Goals Yes     PT LONG TERM GOAL #6   Title Patient verbalizes understanding of energy conservation & exercising with MS / independent in HEP - ongoing fitness plan. (Target Date: 05/19/2016)   Time 8   Period Weeks            Plan - 04/17/16 1244    Clinical Impression Statement Today's session addressed progress toward STGs and continued to address use of rollator. Pt has met 1 STG and parially met most others. Pt reports feeling more  comfortable, less pain and more stable with rollator. Discussed with primary PT and she will call [facility] to see about switching out pt's RW for a rollator as we are still within the 30 day window to do so. Will also need to practice with rollator outside and  with loading in/out of car prior to issuing one to pt (not able to today due to weather and time contraints). Pt is progressing well towards goals.                                                              Rehab Potential Good   Clinical Impairments Affecting Rehab Potential Fear, first exacerbation of MS   PT Frequency 2x / week   PT Duration 8 weeks   PT Treatment/Interventions ADLs/Self Care Home Management;DME Instruction;Gait training;Stair training;Functional mobility training;Therapeutic activities;Therapeutic exercise;Balance training;Neuromuscular re-education;Patient/family education;Vestibular;Canalith Repostioning   PT Next Visit Plan rollator on outdoor surfaces, try loading in/out of car;assess remaining STGs as well.  is this still needed?? pt able to lock and unlock on 04-14-16 session; she would definitely benefit from rollator rather than RW which she currently has   PT Home Exercise Plan Please add seated head turns to HEP (for habituation; make sur dizziness doesn't increase by more than 2/10 points).    Consulted and Agree with Plan of Care Patient      Patient will benefit from skilled therapeutic intervention in order to improve the following deficits and impairments:  Abnormal gait, Decreased activity tolerance, Decreased balance, Decreased knowledge of use of DME, Decreased mobility, Dizziness  Visit Diagnosis: Other abnormalities of gait and mobility  Dizziness and giddiness  Unsteadiness on feet     Problem List Patient Active Problem List   Diagnosis Date Noted  . Diplopia 02/29/2016  . Memory disorder 02/29/2016  . Abnormality of gait 11/25/2014  . Multiple sclerosis ([place]) 11/21/2013  . Neurogenic  bladder 11/21/2013  . Headache, tension type, chronic 11/17/2011  . Hot flashes 11/17/2011  . Essential hypertension, benign 09/20/2011  . Other screening mammogram 08/18/2011  . Routine general medical examination at a health care facility 08/18/2011    Willow Ora, PTA, [facility] [address] [place], [place] [address] [phone] 04/18/16, 10:02 AM   Name: Connie Robertson MRN: [id number] Date of Birth: 07-Apr-1971

## 2016-04-19 ENCOUNTER — Ambulatory Visit: Payer: Medicare Other | Admitting: Physical Therapy

## 2016-04-19 ENCOUNTER — Telehealth: Payer: Self-pay | Admitting: Neurology

## 2016-04-19 NOTE — Telephone Encounter (Signed)
Connie Robertson/Connie Robertson called needing clarification on Acthar medication. Please call [phone] , ref # [id number]

## 2016-04-19 NOTE — Telephone Encounter (Signed)
Called and spoke to pt. She has her 1st Tysabri treatment scheduled @ 10 am today. Completed 10 day course of Acthar and is feeling much better. Let her know that per Dr. Anne Hahn, refill on Acthar was faxed in this morning. However, pt is not to use the medication now but store it in the fridge for use during MS exacerbation in the future. Verbalized understanding and appreciation for call.

## 2016-04-19 NOTE — Telephone Encounter (Signed)
[phone], [facility] called would like to schedule shipment. Please call

## 2016-04-19 NOTE — Telephone Encounter (Signed)
Pt's daughter, Ronne Binning called, to report side effects of Tysabri infusion. Also spoke to pt for a couple of minutes but she tnen became tired and handed phone to her dtr to speak for her. Says that she's having R sided facial twitching, headache, dizziness and her legs feel like jello. Spoke to [employer] in [place] who reports that pt c/o twitching in her face prior to administration of med. Let pt/daughter know that common side effects of Tysabri can include headache, tired feeling, joint/muscle pain/weaknes, redness/irritation at the injection site, swelling of hands/feet/ankles and skin rash. Advised, per [facility], that she may take OTC Benadryl for comfort. Will also notify Dr. Anne Hahn of complaints.

## 2016-04-19 NOTE — Telephone Encounter (Signed)
I called patient. The patient has felt some fatigue and tremulousness following the Tysabri infusion, otherwise she is doing relatively well. I do not think that she has had a severe reaction to the Tysabri. We will follow.

## 2016-04-20 ENCOUNTER — Ambulatory Visit: Payer: Medicare Other | Admitting: Physical Therapy

## 2016-04-20 NOTE — Telephone Encounter (Signed)
This call was for the patient Acthar. They need clarification on the medication. Victorino Dike would you mind calling them back?

## 2016-04-21 ENCOUNTER — Telehealth: Payer: Self-pay | Admitting: Physical Therapy

## 2016-04-21 ENCOUNTER — Ambulatory Visit: Payer: Medicare Other | Admitting: Physical Therapy

## 2016-04-21 ENCOUNTER — Encounter: Payer: Self-pay | Admitting: Physical Therapy

## 2016-04-21 DIAGNOSIS — R42 Dizziness and giddiness: Secondary | ICD-10-CM

## 2016-04-21 DIAGNOSIS — R2681 Unsteadiness on feet: Secondary | ICD-10-CM

## 2016-04-21 DIAGNOSIS — R269 Unspecified abnormalities of gait and mobility: Secondary | ICD-10-CM

## 2016-04-21 DIAGNOSIS — R2689 Other abnormalities of gait and mobility: Secondary | ICD-10-CM

## 2016-04-21 DIAGNOSIS — H8113 Benign paroxysmal vertigo, bilateral: Secondary | ICD-10-CM

## 2016-04-21 NOTE — Telephone Encounter (Signed)
I will write a prescription for a rolling her.

## 2016-04-21 NOTE — Telephone Encounter (Signed)
Dr. Anne Hahn,  Noted order for rolling walker. Thanks so much for placing that.   Upon further assessment, determined rollator (4-wheeled walker with seat) would be a more appropriate assistive device for patient at this time. This will improve energy conservation and allow patient to sit, as needed.  If you agree, please place an order for a rollator (4-wheeled walker with seat). Sorry for any inconvenience.  Thanks so much,  Jorje Guild, PT, DPT [facility] [address] [place], [place], [address] Phone: [phone]   Fax:  [phone] 04/21/16, 10:17 AM

## 2016-04-21 NOTE — Therapy (Signed)
[facility] [address] [place], [place], [address] Phone: [phone]   Fax:  [phone]  Physical Therapy Treatment  Patient Details  Name: Connie Robertson MRN: [id number] Date of Birth: [birth year] Referring Provider: Elon Alas. Jannifer Franklin, MD  Encounter Date: 04/21/2016      PT End of Session - 04/21/16 1702    Visit Number 7   Number of Visits 17   Date for PT Re-Evaluation 05/22/06   Authorization Type [employer] Medicare G-Code & Progress report   PT Start Time 9295   PT Stop Time 1100   PT Time Calculation (min) 45 min   Equipment Utilized During Treatment Gait belt   Activity Tolerance Patient tolerated treatment well   Behavior During Therapy WFL for tasks assessed/performed      Past Medical History:  Diagnosis Date  . Abnormality of gait 11/25/2014  . Chronic daily headache 2003   Love  . Depression   . Memory disorder 02/29/2016  . MS (multiple sclerosis) (HCC) 1992   Dr. Erling Cruz  . Multiple sclerosis ([place]) 11/21/2013  . Neurogenic bladder 11/21/2013    Past Surgical History:  Procedure Laterality Date  . ABLATION COLPOCLESIS  2004  . CESAREAN SECTION    . TUBAL LIGATION      There were no vitals filed for this visit.      Subjective Assessment - 04/21/16 1026    Subjective Pt reports starting a new medication the day before yesterday and her legs "feel a little wobbly".  She also states that she still fells light headed at times.   Pertinent History HTN   Limitations Standing;Walking   Patient Stated Goals To get back to previous to exacerbation level including exercise class   Currently in Pain? Yes   Pain Score 7    Pain Location Head  HA   Pain Type Acute pain   Pain Onset More than a month ago   Pain Frequency Intermittent   Multiple Pain Sites No                Vestibular Assessment - 04/21/16 0001      Positional Testing   Dix-Hallpike Dix-Hallpike Right     Dix-Hallpike Right   Dix-Hallpike Right Duration 20 seconds   Dix-Hallpike Right Symptoms Upbeat, right rotatory nystagmus                 [facility] Adult PT Treatment/Exercise - 04/21/16 1015      Transfers   Transfers Sit to Stand;Stand to [employer] Transfers   Sit to Stand 5: Supervision   Sit to Stand Details (indicate cue type and reason) Verbal cuing for hand placement when standing to the rollator   Stand to Sit 5: Supervision   Stand to Sit Details Verbal cues to reach back for the seat rather than keeping hands on the rollator   Stand Pivot Transfers 5: Supervision     Ambulation/Gait   Ambulation/Gait Yes   Ambulation/Gait Assistance 5: Supervision   Ambulation/Gait Assistance Details Verbal cues for sequencing for going up and down a curb with rollator   Ambulation Distance (Feet) 500 Feet   Assistive device Rollator   Gait Pattern Step-through pattern;Decreased hip/knee flexion - right;Decreased hip/knee flexion - left;Decreased trunk rotation;Trunk flexed   Ambulation Surface Level;Unlevel;Indoor;Outdoor;Paved   Stairs Yes   Stairs Assistance 5: Supervision   Stairs Assistance Details (indicate cue type and reason) Verbal cuing to use one UE in order to meet set goal  Stair Management Technique One rail Right   Number of Stairs 4   Height of Stairs 6   Curb 5: Supervision   Curb Details (indicate cue type and reason) see above         Vestibular Treatment/Exercise - 04/21/16 0001      Vestibular Treatment/Exercise   Vestibular Treatment Provided Canalith Repositioning      EPLEY MANUEVER RIGHT   Number of Reps  1   Overall Response Improved Symptoms   Response Details  Unable to reassess due to time constraint. Post-treatment, pt reports decreased headache (from 8/1o to 6-7/10), improved dizziness, but increased nausea.               PT Education - 04/21/16 1103    Education provided Yes   Education Details Pt educated on proper use of  rollator when negotiating curbs.  She was also instructed to continue habituation exercises at home to decr vertigo symptoms.   Person(s) Educated Patient   Methods Explanation;Verbal cues   Comprehension Verbalized understanding;Returned demonstration          PT Short Term Goals - 04/21/16 1056      PT SHORT TERM GOAL #1   Title Patient verbalizes understanding of energy conservation with activities with MS. (Target Date: 04/21/2016)   Baseline met on 04/17/16   Status Achieved     PT SHORT TERM GOAL #2   Title Patient ambulates 300' with single point cane modified independent. (Target Date: 04/21/2016)   Baseline 04/17/16: pt able to ambulate up to 220 feet consecutively with rollator (no longer trying for cane use)   Status Partially Met     PT SHORT TERM GOAL #3   Title Patient negotiates ramps, curbs and stairs (1 rail) with single point cane with supervision. (Target Date: 04/21/2016) (No longer trying for cane; used rollator)   Baseline 04/17/16: min guard to min assist with rollator on ramp and curb; stairs met 04/21/16   Status Partially Met     PT SHORT TERM GOAL #4   Title Patient reaches 10" without balance loss. (Target Date: 04/21/2016)   Baseline GOAL MET: 04/21/16   Status Achieved     PT SHORT TERM GOAL #5   Title Patient reports 25% improvement in vertigo with sit to stand and supine to sit. (Target Date: 04/21/2016)   Time 4   Period Weeks   Status On-going           PT Long Term Goals - 03/22/16 1546      PT LONG TERM GOAL #1   Title Berg Balance >36/56 to indicate lower fall risk. (Target Date: 05/19/2016)   Time 8   Period Weeks   Status New     PT LONG TERM GOAL #2   Title Patient ambulates 500' including grass, paved surfaces without device modified independent. (Target Date: 05/19/2016)   Time 8   Period Weeks   Status New     PT LONG TERM GOAL #3   Title Patient negotiates ramps, curbs & stairs (1 rail) without device modified independent.  (Target Date: 05/19/2016)   Time 8   Period Weeks   Status New     PT LONG TERM GOAL #4   Title Timed Up-Go <12 seconds without device independently. (Target Date: 05/19/2016)   Time 8   Period Weeks   Status New     PT LONG TERM GOAL #5   Title Patient reports 50% improvement in dizziness with sit to/from stand, supine  to/from sit and rolling in bed. (Target Date: 05/19/2016)   Time 8   Period Weeks   Status New     Additional Long Term Goals   Additional Long Term Goals Yes     PT LONG TERM GOAL #6   Title Patient verbalizes understanding of energy conservation & exercising with MS / independent in HEP - ongoing fitness plan. (Target Date: 05/19/2016)   Time 8   Period Weeks               Plan - 04/21/16 1704    Clinical Impression Statement Pt tolerated treatment well today and was able to incr ambulation distance over outdoor terrain including pavement, inclinces, declines, and curbs with no difficulty and minor cuing.  She was also able to negotiate stairs with a decr in assistance level indicatiing an improvement in strength and balance.  Pt also agreed to canalith repositioning maneuver due to continued bouts of dizziness and nausea that has prevented her from participatig in her usual activities.  Pt reported a decr in symptoms after repositioning maneuver, however, due to time restraints PT was unable to reassess.  She was able to meet all STG's except one addressing vertigo symptoms.  Will be addressed during next session.     Rehab Potential Good   Clinical Impairments Affecting Rehab Potential Fear, first exacerbation of MS   PT Frequency 2x / week   PT Duration 8 weeks   PT Treatment/Interventions ADLs/Self Care Home Management;DME Instruction;Gait training;Stair training;Functional mobility training;Therapeutic activities;Therapeutic exercise;Balance training;Neuromuscular re-education;Patient/family education;Vestibular;Canalith Repostioning   PT Next Visit  Plan try loading rollator/RW in/out of car;assess remaining STGs as well, LE strength, balance  is this still needed?? pt able to lock and unlock on 04-14-16 session; she would definitely benefit from rollator rather than RW which she currently has   PT Home Exercise Plan Please add seated head turns to HEP (for habituation; make sur dizziness doesn't increase by more than 2/10 points).    Consulted and Agree with Plan of Care Patient      Patient will benefit from skilled therapeutic intervention in order to improve the following deficits and impairments:  Abnormal gait, Decreased activity tolerance, Decreased balance, Decreased knowledge of use of DME, Decreased mobility, Dizziness  Visit Diagnosis: Other abnormalities of gait and mobility  BPPV (benign paroxysmal positional vertigo), bilateral  Unsteadiness on feet  Dizziness and giddiness     Problem List Patient Active Problem List   Diagnosis Date Noted  . Diplopia 02/29/2016  . Memory disorder 02/29/2016  . Abnormality of gait 11/25/2014  . Multiple sclerosis ([place]) 11/21/2013  . Neurogenic bladder 11/21/2013  . Headache, tension type, chronic 11/17/2011  . Hot flashes 11/17/2011  . Essential hypertension, benign 09/20/2011  . Other screening mammogram 08/18/2011  . Routine general medical examination at a health care facility 08/18/2011    Katherine Mantle , SPT 04/21/2016, 5:20 PM  [facility] [address] [place], [place], [address] Phone: [phone]   Fax:  [phone]  Name: Connie Robertson MRN: [id number] Date of Birth: 02/25/1971

## 2016-04-24 ENCOUNTER — Encounter: Payer: Self-pay | Admitting: Physical Therapy

## 2016-04-24 ENCOUNTER — Ambulatory Visit: Payer: Medicare Other | Admitting: Physical Therapy

## 2016-04-24 DIAGNOSIS — R2689 Other abnormalities of gait and mobility: Secondary | ICD-10-CM

## 2016-04-24 DIAGNOSIS — R269 Unspecified abnormalities of gait and mobility: Secondary | ICD-10-CM

## 2016-04-24 DIAGNOSIS — R2681 Unsteadiness on feet: Secondary | ICD-10-CM

## 2016-04-24 DIAGNOSIS — H8113 Benign paroxysmal vertigo, bilateral: Secondary | ICD-10-CM

## 2016-04-24 NOTE — Telephone Encounter (Signed)
Returned call to [facility] to request dispense of 1 vial (24ml). Pt previously instructed to store vial in refrigerator for later use if needed for MS exacerbation per Dr. Anne Hahn.

## 2016-04-24 NOTE — Therapy (Signed)
[facility] [address] [place], [place], [address] Phone: [phone]   Fax:  [phone]  Physical Therapy Treatment  Patient Details  Name: Connie Robertson MRN: [id number] Date of Birth: Dec 23, 1970 Referring Provider: Elon Alas. Jannifer Franklin, MD  Encounter Date: 04/24/2016      PT End of Session - 04/24/16 1455    Visit Number 8   Number of Visits 17   Date for PT Re-Evaluation 05/22/06   Authorization Type [employer] Medicare G-Code & Progress report   PT Start Time 1145   PT Stop Time 1230   PT Time Calculation (min) 45 min   Equipment Utilized During Treatment Gait belt   Activity Tolerance Patient tolerated treatment well;Patient limited by fatigue   Behavior During Therapy WFL for tasks assessed/performed      Past Medical History:  Diagnosis Date  . Abnormality of gait 11/25/2014  . Chronic daily headache 2003   Love  . Depression   . Memory disorder 02/29/2016  . MS (multiple sclerosis) (HCC) 1992   Dr. Erling Cruz  . Multiple sclerosis ([place]) 11/21/2013  . Neurogenic bladder 11/21/2013    Past Surgical History:  Procedure Laterality Date  . ABLATION COLPOCLESIS  2004  . CESAREAN SECTION    . TUBAL LIGATION      There were no vitals filed for this visit.      Subjective Assessment - 04/24/16 1144    Subjective Pt reports some improvement in dizziness symptoms since last visit stating that "it comes and goes".  She also states she feel like her L leg "gets too straight".   Pertinent History HTN   Limitations Standing;Walking   Patient Stated Goals To get back to previous to exacerbation level including exercise class   Currently in Pain? Yes   Pain Score 6    Pain Location Head  HA   Pain Descriptors / Indicators Aching   Pain Type Chronic pain   Pain Onset More than a month ago   Pain Frequency Constant   Multiple Pain Sites No                Vestibular Assessment - 04/24/16 0001      Positional Testing   Dix-Hallpike Dix-Hallpike Right   Sidelying Test --     Dix-Hallpike Right   Dix-Hallpike Right Duration Approx 20 seconds   Dix-Hallpike Right Symptoms Upbeat, right rotatory nystagmus                 [facility] Adult PT Treatment/Exercise - 04/24/16 1145      Transfers   Transfers Sit to Stand;Stand to [employer] Transfers   Sit to Stand From chair/3-in-1;With upper extremity assist;6: Modified independent (Device/Increase time);With armrests   Stand to Sit To chair/3-in-1;6: Modified independent (Device/Increase time);With upper extremity assist;With armrests   Stand Pivot Transfers 5: Supervision     Ambulation/Gait   Ambulation/Gait Yes   Ambulation/Gait Assistance 5: Supervision   Ambulation/Gait Assistance Details Gait assessed to determine cause of L LE hyperext during gait activities.  PT recommended pt use SPC at home   Ambulation Distance (Feet) 250 Feet  1x250 indoor/outdoor, 2x185f indoor   Assistive device Rolling walker;Straight cane   Gait Pattern Step-through pattern;Decreased arm swing - right;Decreased arm swing - left;Decreased step length - right;Decreased step length - left;Decreased stance time - left;Decreased hip/knee flexion - right;Decreased hip/knee flexion - left;Wide base of support;Trunk flexed   Ambulation Surface Level;Unlevel;Indoor;Outdoor     Exercises   Exercises Knee/Hip  Knee/Hip Exercises: Standing   Other Standing Knee Exercises Performed sit to stands x10 from standard chair to incr strength in B LE.  Cuing needed to no tuse hands during stand or sit and to control sitting rather than "flopping" down.     Knee/Hip Exercises: Supine   Bridges Strengthening;Both;10 reps   Bridges Limitations Verbal and tactile cuing to prevent B LE abduction during exercise.  Pt also cued to prevent hip lateral sway during concentric portion of exercise.         Vestibular Treatment/Exercise - 04/24/16 1145       Vestibular Treatment/Exercise   Vestibular Treatment Provided Canalith Repositioning;Habituation   Canalith Repositioning Epley Manuever Right   Habituation Exercises Laruth Bouchard Daroff      EPLEY MANUEVER RIGHT   Number of Reps  2   Overall Response Improved Symptoms   Response Details  Initially pt reported no change in symptoms, however, as treatment session progressed she reported significant improvement. Reassessment of R Sidelying Test reveals <10 seconds of R upbeating, torsional nystagmus accompanied by mild symptoms.     Nestor Lewandowsky   Number of Reps  1   Symptom Description  Reviewed technique for Nestor Lewandowsky; cueing required for head positioning.               PT Education - 04/24/16 1454    Education provided Yes   Education Details Pt educated on new HEP and to use the SPC only at home and RW when out in the community. Provided patient with MD order for rollator.   Person(s) Educated Patient   Methods Explanation;Verbal cues;Handout   Comprehension Verbalized understanding          PT Short Term Goals - 04/21/16 1056      PT SHORT TERM GOAL #1   Title Patient verbalizes understanding of energy conservation with activities with MS. (Target Date: 04/21/2016)   Baseline met on 04/17/16   Status Achieved     PT SHORT TERM GOAL #2   Title Patient ambulates 300' with single point cane modified independent. (Target Date: 04/21/2016)   Baseline 04/17/16: pt able to ambulate up to 220 feet consecutively with rollator (no longer trying for cane use)   Status Partially Met     PT SHORT TERM GOAL #3   Title Patient negotiates ramps, curbs and stairs (1 rail) with single point cane with supervision. (Target Date: 04/21/2016) (No longer trying for cane; used rollator)   Baseline 04/17/16: min guard to min assist with rollator on ramp and curb; stairs met 04/21/16   Status Partially Met     PT SHORT TERM GOAL #4   Title Patient reaches 10" without balance loss. (Target Date:  04/21/2016)   Baseline GOAL MET: 04/21/16   Status Achieved     PT SHORT TERM GOAL #5   Title Patient reports 25% improvement in vertigo with sit to stand and supine to sit. (Target Date: 04/21/2016)   Time 4   Period Weeks   Status On-going           PT Long Term Goals - 03/22/16 1546      PT LONG TERM GOAL #1   Title Berg Balance >36/56 to indicate lower fall risk. (Target Date: 05/19/2016)   Time 8   Period Weeks   Status New     PT LONG TERM GOAL #2   Title Patient ambulates 500' including grass, paved surfaces without device modified independent. (Target Date: 05/19/2016)   Time 8  Period Weeks   Status New     PT LONG TERM GOAL #3   Title Patient negotiates ramps, curbs & stairs (1 rail) without device modified independent. (Target Date: 05/19/2016)   Time 8   Period Weeks   Status New     PT LONG TERM GOAL #4   Title Timed Up-Go <12 seconds without device independently. (Target Date: 05/19/2016)   Time 8   Period Weeks   Status New     PT LONG TERM GOAL #5   Title Patient reports 50% improvement in dizziness with sit to/from stand, supine to/from sit and rolling in bed. (Target Date: 05/19/2016)   Time 8   Period Weeks   Status New     Additional Long Term Goals   Additional Long Term Goals Yes     PT LONG TERM GOAL #6   Title Patient verbalizes understanding of energy conservation & exercising with MS / independent in HEP - ongoing fitness plan. (Target Date: 05/19/2016)   Time 8   Period Weeks               Plan - 04/24/16 1455    Clinical Impression Statement Pt showed improvement in her vertigo symptoms from last visit although still reporting dizzines with some movements.  Symptoms continued to improve after bouts of repositioning maneuvers and as session continued.  Pt also demonstrated minor genu recurvatum during gait that was seen when advancing weight on stance leg, particularly when ambulating outdoors.  Pt is in need of LE and core  strengthening as seen today by fatigue after exercise and poor body control.  Future session should focus on building her HEP to include core and LE strengthening.  Pt would continue to benefit from skilled PT to address these deficits.   Rehab Potential Good   Clinical Impairments Affecting Rehab Potential Fear, first exacerbation of MS   PT Frequency 2x / week   PT Duration 8 weeks   PT Treatment/Interventions ADLs/Self Care Home Management;DME Instruction;Gait training;Stair training;Functional mobility training;Therapeutic activities;Therapeutic exercise;Balance training;Neuromuscular re-education;Patient/family education;Vestibular;Canalith Repostioning   PT Next Visit Plan try loading rollator/RW in/out of car; LE and core strengthening; assess vertigo symptoms   PT Home Exercise Plan Bridging, sit to stands   Consulted and Agree with Plan of Care Patient      Patient will benefit from skilled therapeutic intervention in order to improve the following deficits and impairments:  Abnormal gait, Decreased activity tolerance, Decreased balance, Decreased knowledge of use of DME, Decreased mobility, Dizziness  Visit Diagnosis: Abnormality of gait  Other abnormalities of gait and mobility  BPPV (benign paroxysmal positional vertigo), bilateral  Unsteadiness on feet     Problem List Patient Active Problem List   Diagnosis Date Noted  . Diplopia 02/29/2016  . Memory disorder 02/29/2016  . Abnormality of gait 11/25/2014  . Multiple sclerosis ([place]) 11/21/2013  . Neurogenic bladder 11/21/2013  . Headache, tension type, chronic 11/17/2011  . Hot flashes 11/17/2011  . Essential hypertension, benign 09/20/2011  . Other screening mammogram 08/18/2011  . Routine general medical examination at a health care facility 08/18/2011    Katherine Mantle , SPT 04/24/2016, 4:59 PM  [facility] [address] [place], [place],  [address] Phone: [phone]   Fax:  [phone]  Name: Connie Robertson MRN: [id number] Date of Birth: March 15, 1971

## 2016-04-24 NOTE — Patient Instructions (Signed)
Functional Quadriceps: Sit to Stand    Sit on edge of chair, feet flat on floor. Without using your hands, stand upright, extending knees fully. Repeat __10__ times per set.  Do _1-2__ sessions per day.  Copyright  VHI. All rights reserved.    Bracing With Bridging (Hook-Lying)    With neutral spine, tighten pelvic floor and abdominals and hold. Lift bottom. Repeat _10__ times. Do _1-2__ times a day.  Tip Card 1.The goal of habituation training is to assist in decreasing symptoms of vertigo, dizziness, or nausea provoked by specific head and body motions. 2.These exercises may initially increase symptoms; however, be persistent and work through symptoms. With repetition and time, the exercises will assist in reducing or eliminating symptoms. 3.Exercises should be stopped and discussed with the therapist if you experience any of the following: - Sudden change or fluctuation in hearing - New onset of ringing in the ears, or increase in current intensity - Any fluid discharge from the ear - Severe pain in neck or back - Extreme nausea  Copyright  VHI. All rights reserved.  Sit to Side-Lying   Sit on edge of bed. Lie down onto the right side and hold until dizziness stops, plus 20 seconds.  Return to sitting and wait until dizziness stops, plus 20 seconds.  Repeat to the left side. Repeat sequence 5 times per session. Do 2 sessions per day.

## 2016-04-25 ENCOUNTER — Telehealth: Payer: Self-pay | Admitting: Neurology

## 2016-04-25 NOTE — Telephone Encounter (Signed)
Spoke to pt. She will have caregiver bring in paperwork for PCA at home for Dr. Anne Hahn to review/sign. Pt will also pick up rx for walker that is at the front desk for her. Pt also c/o nighttime urinary incontinence. She will check w/ her pharmacy to refill Vesicare that was previously ordered. May call back w/ additional questions/concerns.

## 2016-04-25 NOTE — Telephone Encounter (Signed)
Pt called in stating she is in need of assistance at home, some type of personal assistance/CNA. She wants to know about sending some papers/forms for a signature to the office. Please call and advise

## 2016-04-27 ENCOUNTER — Ambulatory Visit: Payer: Medicare Other | Admitting: Physical Therapy

## 2016-04-27 ENCOUNTER — Encounter: Payer: Self-pay | Admitting: Physical Therapy

## 2016-04-27 DIAGNOSIS — R2689 Other abnormalities of gait and mobility: Secondary | ICD-10-CM

## 2016-04-27 DIAGNOSIS — R2681 Unsteadiness on feet: Secondary | ICD-10-CM

## 2016-04-27 DIAGNOSIS — R42 Dizziness and giddiness: Secondary | ICD-10-CM

## 2016-04-27 NOTE — Therapy (Signed)
[facility] [address] [place], [place], [address] Phone: [phone]   Fax:  [phone]  Physical Therapy Treatment  Patient Details  Name: Connie Robertson MRN: [id number] Date of Birth: August 31, 1970 Referring Provider: Elon Alas. Jannifer Franklin, MD  Encounter Date: 04/27/2016      PT End of Session - 04/27/16 1107    Visit Number 9   Number of Visits 17   Date for PT Re-Evaluation 05/22/06   Authorization Type [employer] Medicare G-Code & Progress report   PT Start Time 1102   PT Stop Time 1145   PT Time Calculation (min) 43 min   Equipment Utilized During Treatment Gait belt   Activity Tolerance Patient tolerated treatment well;Patient limited by fatigue   Behavior During Therapy WFL for tasks assessed/performed      Past Medical History:  Diagnosis Date  . Abnormality of gait 11/25/2014  . Chronic daily headache 2003   Love  . Depression   . Memory disorder 02/29/2016  . MS (multiple sclerosis) (HCC) 1992   Dr. Erling Cruz  . Multiple sclerosis ([place]) 11/21/2013  . Neurogenic bladder 11/21/2013    Past Surgical History:  Procedure Laterality Date  . ABLATION COLPOCLESIS  2004  . CESAREAN SECTION    . TUBAL LIGATION      There were no vitals filed for this visit.      Subjective Assessment - 04/27/16 1106    Subjective No new complaints. No falls or pain to report other than headache.    Pertinent History HTN   Limitations Standing;Walking   Patient Stated Goals To get back to previous to exacerbation level including exercise class   Currently in Pain? Yes   Pain Score 6    Pain Location Head   Pain Descriptors / Indicators Headache   Pain Type Chronic pain   Pain Onset More than a month ago   Pain Frequency Constant   Aggravating Factors  standing up/walking   Pain Relieving Factors sitting/resting     Treatment: Exercises: Hook lying: cues and assist needed for ex form/technique  Bridge, 5 sec hold x 10  reps Bridge with pillow squeeze x 10 reps Bridge with legs over red pball x 10 reps Straight leg raises x 10 reps each leg, AA for slow, controlled movements, especially with lowering leg down With red band around legs just above knee - hip abduction/ER x 10 reps - alternating marching x 10 reps each leg  Seated on green air cushion: Lateral pelvic rocking x 10 each way Anterior/posterior pelvic rocking x 10 each way Modified curl ups with inverted chair behind pt's back, 2 sets of 10 reps with arms across chest. Lateral elbow taps to yoga blocks and back up x 10 reps each side, x 10 reps each side.         PT Education - 04/27/16 1227    Education provided Yes   Education Details Reinforced education to use cane in home only  and RW in community.   Person(s) Educated Patient   Methods Explanation;Demonstration   Comprehension Verbalized understanding;Returned demonstration;Need further instruction          PT Short Term Goals - 04/21/16 1056      PT SHORT TERM GOAL #1   Title Patient verbalizes understanding of energy conservation with activities with MS. (Target Date: 04/21/2016)   Baseline met on 04/17/16   Status Achieved     PT SHORT TERM GOAL #2   Title Patient ambulates 300' with  single point cane modified independent. (Target Date: 04/21/2016)   Baseline 04/17/16: pt able to ambulate up to 220 feet consecutively with rollator (no longer trying for cane use)   Status Partially Met     PT SHORT TERM GOAL #3   Title Patient negotiates ramps, curbs and stairs (1 rail) with single point cane with supervision. (Target Date: 04/21/2016) (No longer trying for cane; used rollator)   Baseline 04/17/16: min guard to min assist with rollator on ramp and curb; stairs met 04/21/16   Status Partially Met     PT SHORT TERM GOAL #4   Title Patient reaches 10" without balance loss. (Target Date: 04/21/2016)   Baseline GOAL MET: 04/21/16   Status Achieved     PT SHORT TERM GOAL #5    Title Patient reports 25% improvement in vertigo with sit to stand and supine to sit. (Target Date: 04/21/2016)   Time 4   Period Weeks   Status On-going           PT Long Term Goals - 03/22/16 1546      PT LONG TERM GOAL #1   Title Berg Balance >36/56 to indicate lower fall risk. (Target Date: 05/19/2016)   Time 8   Period Weeks   Status New     PT LONG TERM GOAL #2   Title Patient ambulates 500' including grass, paved surfaces without device modified independent. (Target Date: 05/19/2016)   Time 8   Period Weeks   Status New     PT LONG TERM GOAL #3   Title Patient negotiates ramps, curbs & stairs (1 rail) without device modified independent. (Target Date: 05/19/2016)   Time 8   Period Weeks   Status New     PT LONG TERM GOAL #4   Title Timed Up-Go <12 seconds without device independently. (Target Date: 05/19/2016)   Time 8   Period Weeks   Status New     PT LONG TERM GOAL #5   Title Patient reports 50% improvement in dizziness with sit to/from stand, supine to/from sit and rolling in bed. (Target Date: 05/19/2016)   Time 8   Period Weeks   Status New     Additional Long Term Goals   Additional Long Term Goals Yes     PT LONG TERM GOAL #6   Title Patient verbalizes understanding of energy conservation & exercising with MS / independent in HEP - ongoing fitness plan. (Target Date: 05/19/2016)   Time 8   Period Weeks            Plan - 04/27/16 1107    Clinical Impression Statement Today's session focused on LE and core strengthening with no issues reported. Reinforced use of RW with communtiy gait for safety due to weakness and imbalance. Pt is making steady progress toward goals and should benefit from continued PT to progress towrad unmet goals.    Rehab Potential Good   Clinical Impairments Affecting Rehab Potential Fear, first exacerbation of MS   PT Frequency 2x / week   PT Duration 8 weeks   PT Treatment/Interventions ADLs/Self Care Home  Management;DME Instruction;Gait training;Stair training;Functional mobility training;Therapeutic activities;Therapeutic exercise;Balance training;Neuromuscular re-education;Patient/family education;Vestibular;Canalith Repostioning   PT Next Visit Plan G-code next visit. try loading rollator/RW in/out of car; LE and core strengthening; assess vertigo symptoms   PT Home Exercise Plan Bridging, sit to stands   Consulted and Agree with Plan of Care Patient      Patient will benefit from skilled therapeutic intervention  in order to improve the following deficits and impairments:  Abnormal gait, Decreased activity tolerance, Decreased balance, Decreased knowledge of use of DME, Decreased mobility, Dizziness  Visit Diagnosis: Other abnormalities of gait and mobility  Unsteadiness on feet  Dizziness and giddiness     Problem List Patient Active Problem List   Diagnosis Date Noted  . Diplopia 02/29/2016  . Memory disorder 02/29/2016  . Abnormality of gait 11/25/2014  . Multiple sclerosis ([place]) 11/21/2013  . Neurogenic bladder 11/21/2013  . Headache, tension type, chronic 11/17/2011  . Hot flashes 11/17/2011  . Essential hypertension, benign 09/20/2011  . Other screening mammogram 08/18/2011  . Routine general medical examination at a health care facility 08/18/2011    Willow Ora, PTA, [facility] [address] [place], [place] [address] [phone] 04/27/16, 12:29 PM   Name: Charlot Gouin MRN: [id number] Date of Birth: [birth year]

## 2016-05-01 ENCOUNTER — Ambulatory Visit: Payer: Medicare Other | Admitting: Physical Therapy

## 2016-05-01 NOTE — Telephone Encounter (Signed)
Forms completed, awaiting MD review and signature.

## 2016-05-01 NOTE — Telephone Encounter (Signed)
Paperwork received and in progress. Dr. Anne Hahn is out of office this afternoon.

## 2016-05-01 NOTE — Telephone Encounter (Signed)
Toniann Fail (friend of pt who dropped of the paper work) called to inquire if the paper work was completed. She can be reached at [phone]

## 2016-05-02 ENCOUNTER — Ambulatory Visit: Payer: Self-pay | Admitting: Physical Therapy

## 2016-05-02 NOTE — Telephone Encounter (Signed)
Paperwork completed and signed by Dr. Anne HahnWillis. Faxed to [facility] [employer] F # [phone]. Called Bunnie DominoWanda Grimes T # [phone] to notify that forms are available and she will pick-up later today.

## 2016-05-04 ENCOUNTER — Encounter: Payer: Self-pay | Admitting: Physical Therapy

## 2016-05-04 ENCOUNTER — Ambulatory Visit: Payer: Medicare Other | Admitting: Physical Therapy

## 2016-05-04 DIAGNOSIS — R2689 Other abnormalities of gait and mobility: Secondary | ICD-10-CM | POA: Diagnosis not present

## 2016-05-04 DIAGNOSIS — R42 Dizziness and giddiness: Secondary | ICD-10-CM

## 2016-05-04 DIAGNOSIS — R2681 Unsteadiness on feet: Secondary | ICD-10-CM

## 2016-05-04 NOTE — Therapy (Addendum)
[facility] [address] [place], [place], [address] Phone: [phone]   Fax:  [phone]  Physical Therapy Treatment  Patient Details  Name: Connie Robertson MRN: [id number] Date of Birth: Oct 08, 1970 Referring Provider: Elon Alas. Jannifer Franklin, MD  Encounter Date: 05/04/2016      PT End of Session - 05/04/16 1108    Visit Number 10   Number of Visits 17   Date for PT Re-Evaluation 05/22/06   Authorization Type [employer] Medicare G-Code & Progress report   PT Start Time 1104   PT Stop Time 1145   PT Time Calculation (min) 41 min   Equipment Utilized During Treatment Gait belt   Activity Tolerance Patient tolerated treatment well;Patient limited by fatigue   Behavior During Therapy WFL for tasks assessed/performed      Past Medical History:  Diagnosis Date  . Abnormality of gait 11/25/2014  . Chronic daily headache 2003   Love  . Depression   . Memory disorder 02/29/2016  . MS (multiple sclerosis) (HCC) 1992   Dr. Erling Cruz  . Multiple sclerosis ([place]) 11/21/2013  . Neurogenic bladder 11/21/2013    Past Surgical History:  Procedure Laterality Date  . ABLATION COLPOCLESIS  2004  . CESAREAN SECTION    . TUBAL LIGATION      There were no vitals filed for this visit.      Subjective Assessment - 05/04/16 1107    Subjective No new complaints. No falls or pain to report other than headache, which is not that bad today.   Pertinent History HTN   Limitations Standing;Walking   Patient Stated Goals To get back to previous to exacerbation level including exercise class   Currently in Pain? Yes   Pain Score 6    Pain Location Head   Pain Descriptors / Indicators Headache   Pain Type Chronic pain   Pain Onset More than a month ago   Pain Frequency Constant   Aggravating Factors  standing up/walking   Pain Relieving Factors sitting/resting            [facility] PT Assessment - 05/04/16 1109      Berg Balance Test   Sit to Stand Able to stand  independently using hands   Standing Unsupported Able to stand 2 minutes with supervision   Sitting with Back Unsupported but Feet Supported on Floor or Stool Able to sit safely and securely 2 minutes   Stand to Sit Controls descent by using hands   Transfers Able to transfer safely, definite need of hands   Standing Unsupported with Eyes Closed Able to stand 10 seconds with supervision   Standing Ubsupported with Feet Together Able to place feet together independently and stand for 1 minute with supervision   From Standing, Reach Forward with Outstretched Arm Can reach forward >12 cm safely (5")  8 inches   From Standing Position, Pick up Object from Floor Able to pick up shoe, needs supervision   From Standing Position, Turn to Look Behind Over each Shoulder Needs supervision when turning   Turn 360 Degrees Needs assistance while turning   Standing Unsupported, Alternately Place Feet on Step/Stool Able to complete >2 steps/needs minimal assist   Standing Unsupported, One Foot in Front Able to take small step independently and hold 30 seconds   Standing on One Leg Tries to lift leg/unable to hold 3 seconds but remains standing independently   Total Score 33   Berg comment: 33/56= high risk for falls (was 16/56  at eval)            [facility] Adult PT Treatment/Exercise - 05/04/16 1129      Knee/Hip Exercises: Aerobic   Other Aerobic Scifit level 1.5 x 8 minutes with goal rpm >/= 30 rpm for strengthening and activity tolerance             PT Short Term Goals - 04/21/16 1056      PT SHORT TERM GOAL #1   Title Patient verbalizes understanding of energy conservation with activities with MS. (Target Date: 04/21/2016)   Baseline met on 04/17/16   Status Achieved     PT SHORT TERM GOAL #2   Title Patient ambulates 300' with single point cane modified independent. (Target Date: 04/21/2016)   Baseline 04/17/16: pt able to ambulate up to 220 feet consecutively  with rollator (no longer trying for cane use)   Status Partially Met     PT SHORT TERM GOAL #3   Title Patient negotiates ramps, curbs and stairs (1 rail) with single point cane with supervision. (Target Date: 04/21/2016) (No longer trying for cane; used rollator)   Baseline 04/17/16: min guard to min assist with rollator on ramp and curb; stairs met 04/21/16   Status Partially Met     PT SHORT TERM GOAL #4   Title Patient reaches 10" without balance loss. (Target Date: 04/21/2016)   Baseline GOAL MET: 04/21/16   Status Achieved     PT SHORT TERM GOAL #5   Title Patient reports 25% improvement in vertigo with sit to stand and supine to sit. (Target Date: 04/21/2016)   Time 4   Period Weeks   Status On-going           PT Long Term Goals - 03/22/16 1546      PT LONG TERM GOAL #1   Title Berg Balance >36/56 to indicate lower fall risk. (Target Date: 05/19/2016)   Time 8   Period Weeks   Status New     PT LONG TERM GOAL #2   Title Patient ambulates 500' including grass, paved surfaces without device modified independent. (Target Date: 05/19/2016)   Time 8   Period Weeks   Status New     PT LONG TERM GOAL #3   Title Patient negotiates ramps, curbs & stairs (1 rail) without device modified independent. (Target Date: 05/19/2016)   Time 8   Period Weeks   Status New     PT LONG TERM GOAL #4   Title Timed Up-Go <12 seconds without device independently. (Target Date: 05/19/2016)   Time 8   Period Weeks   Status New     PT LONG TERM GOAL #5   Title Patient reports 50% improvement in dizziness with sit to/from stand, supine to/from sit and rolling in bed. (Target Date: 05/19/2016)   Time 8   Period Weeks   Status New     Additional Long Term Goals   Additional Long Term Goals Yes     PT LONG TERM GOAL #6   Title Patient verbalizes understanding of energy conservation & exercising with MS / independent in HEP - ongoing fitness plan. (Target Date: 05/19/2016)   Time 8    Period Weeks            Plan - 05/04/16 1108    Clinical Impression Statement Retested Connie Robertson Balance Test today with pt scoring 33/56 (17 point improvement from evaluation). Mild dizziness reported with bending to pick up item off floor and with turing  activites that resolved with seated rest breaks. Remainder of session worked on LE strengthening and activity tolearnce. Pt needed several short rest breaks with Scifit due to fatigue. Pt is making steady progress toward goals and should benefit from continued PT to progress toward unmet goals.                              Rehab Potential Good   Clinical Impairments Affecting Rehab Potential Fear, first exacerbation of MS   PT Frequency 2x / week   PT Duration 8 weeks   PT Treatment/Interventions ADLs/Self Care Home Management;DME Instruction;Gait training;Stair training;Functional mobility training;Therapeutic activities;Therapeutic exercise;Balance training;Neuromuscular re-education;Patient/family education;Vestibular;Canalith Repostioning   PT Next Visit Plan try loading rollator/RW in/out of car; LE and core strengthening; assess vertigo symptoms as needed   PT Home Exercise Plan Bridging, sit to stands   Consulted and Agree with Plan of Care Patient      Patient will benefit from skilled therapeutic intervention in order to improve the following deficits and impairments:  Abnormal gait, Decreased activity tolerance, Decreased balance, Decreased knowledge of use of DME, Decreased mobility, Dizziness  Visit Diagnosis: Other abnormalities of gait and mobility  Unsteadiness on feet  Dizziness and giddiness       G-Codes - 05-21-2016 1135    Functional Assessment Tool Used Berg Balance test 33/56 (was 16/56 at eval)   Functional Limitation Mobility: Walking and moving around      Problem List Patient Active Problem List   Diagnosis Date Noted  . Diplopia 02/29/2016  . Memory disorder 02/29/2016  . Abnormality of gait  11/25/2014  . Multiple sclerosis ([place]) 11/21/2013  . Neurogenic bladder 11/21/2013  . Headache, tension type, chronic 11/17/2011  . Hot flashes 11/17/2011  . Essential hypertension, benign 09/20/2011  . Other screening mammogram 08/18/2011  . Routine general medical examination at a health care facility 08/18/2011    Willow Ora, PTA, [facility] [address], [place], [place] [address] [phone] 2016/05/21, 11:40 AM   Name: Connie Robertson MRN: [id number] Date of Birth: [birth year]   Addendum by Billie Ruddy, PT, DPT      G-Codes - 21-May-2016 1135    Functional Assessment Tool Used Connie Robertson Balance test [phone] (was 16/56 at eval)   Functional Limitation Mobility: Walking and moving around   Mobility: Walking and Moving Around Current Status [phone]) At least 40 percent but less than 60 percent impaired, limited or restricted   Mobility: Walking and Moving Around Goal Status [phone]) At least 20 percent but less than 40 percent impaired, limited or restricted       Physical Therapy Progress Note  Dates of Reporting Period: 03/22/16 to 2016-05-21  Objective Reports of Subjective Statement: See above.  Objective Measurements: See above. Berg = 33/56  Goal Update: See above.  Plan: Continue per POC.  Reason Skilled Services are Required: To maximize stability/independence with functional mobility and decrease fall risk.  Billie Ruddy, PT, DPT [facility] [address] [place], [place], [address] Phone: [phone]   Fax:  [phone] 05/21/2016, 3:02 PM

## 2016-05-08 ENCOUNTER — Ambulatory Visit: Payer: Medicare Other | Attending: Neurology | Admitting: Physical Therapy

## 2016-05-08 DIAGNOSIS — R2689 Other abnormalities of gait and mobility: Secondary | ICD-10-CM

## 2016-05-08 DIAGNOSIS — R2681 Unsteadiness on feet: Secondary | ICD-10-CM | POA: Diagnosis present

## 2016-05-08 DIAGNOSIS — R42 Dizziness and giddiness: Secondary | ICD-10-CM | POA: Insufficient documentation

## 2016-05-08 NOTE — Therapy (Signed)
[facility] [address] [place], [place], [address] Phone: [phone]   Fax:  [phone]  Physical Therapy Treatment  Patient Details  Name: Connie Robertson MRN: [id number] Date of Birth: [birth year] Referring Provider: Elon Alas. Jannifer Franklin, MD  Encounter Date: 05/08/2016      PT End of Session - 05/08/16 1845    Visit Number 11   Number of Visits 17   Date for PT Re-Evaluation 05/22/06   Authorization Type [employer] Medicare G-Code & Progress report   PT Start Time 1155  Pt arrived late to session   PT Stop Time 1230   PT Time Calculation (min) 35 min   Activity Tolerance Patient tolerated treatment well   Behavior During Therapy WFL for tasks assessed/performed      Past Medical History:  Diagnosis Date  . Abnormality of gait 11/25/2014  . Chronic daily headache 2003   Love  . Depression   . Memory disorder 02/29/2016  . MS (multiple sclerosis) (HCC) 1992   Dr. Erling Cruz  . Multiple sclerosis ([place]) 11/21/2013  . Neurogenic bladder 11/21/2013    Past Surgical History:  Procedure Laterality Date  . ABLATION COLPOCLESIS  2004  . CESAREAN SECTION    . TUBAL LIGATION      There were no vitals filed for this visit.      Subjective Assessment - 05/08/16 1158    Subjective Pt brought MD order to medical supply store last week. States, "I gave them the doctor's order and they said they'd call me when the order comes in." Pt also states, "Honestly, I've been walking a little bit around my house without the (single point) cane or the walker." Pt reports dizziness only occurs with head turns while standing/walking now.    Pertinent History HTN   Limitations Standing;Walking   Patient Stated Goals To get back to previous to exacerbation level including exercise class   Currently in Pain? Yes   Pain Score 6    Pain Location Head   Pain Orientation Right;Left   Pain Descriptors / Indicators Headache   Pain Type Chronic  pain   Pain Onset More than a month ago   Pain Frequency Constant   Aggravating Factors  standing up/walking   Pain Relieving Factors sitting/resting   Multiple Pain Sites No                         [facility] Adult PT Treatment/Exercise - 05/08/16 0001      Transfers   Transfers Sit to Stand;Stand to Sit   Sit to Stand 6: Modified independent (Device/Increase time)   Sit to Stand Details (indicate cue type and reason) to RW; to rollator   Stand to Sit 6: Modified independent (Device/Increase time)   Stand to Sit Details to RW; to rollator     Ambulation/Gait   Ambulation/Gait Yes   Ambulation/Gait Assistance 4: Min guard;5: Supervision;6: Modified independent (Device/Increase time)   Ambulation/Gait Assistance Details Gait 2 x225' over level, indoor and unlevel, outdoor surfaces (ambulating from clinic gym to pt's car in parking lot) with mod I using rollator. Gait 2 x115' over level, indoor surfaces with (S) to min guard using SPC during initial trial, SBQC during subsequent trial with cueing for sequencing of two-point gait patter with canes.    Ambulation Distance (Feet) 680 Feet  2 x225', 2 x115'   Assistive device Rollator;Straight cane;Small based quad cane   Gait Pattern Step-through pattern;Decreased hip/knee flexion -  right;Decreased hip/knee flexion - left;Wide base of support;Decreased stride length;Trendelenburg   Ambulation Surface Level;Indoor;Unlevel;Outdoor;Paved     Therapeutic Activites    Therapeutic Activities Other Therapeutic Activities   Other Therapeutic Activities After PT explained and demonstrated how to fold rollator and lift into car, pt attempted to do so while seated in driver's seat of personal car; however, pt unable due to weight of rollator and lack of room in front. Pt also unable to lift rollator into back seat of car (initially attempted so pt wouldn't have to ambulate as far without device). Finally, pt able to fold up rollator, lift  into trunk of car with close supervision, and ambulate to driver's side of car with close supervision.          Vestibular Treatment/Exercise - 05/08/16 0001      Vestibular Treatment/Exercise   Vestibular Treatment Provided Habituation   Habituation Exercises Standing Horizontal Head Turns   Gaze Exercises --     Standing Horizontal Head Turns   Number of Reps  3   Symptom Description  7/10 dizziness after horizontal head turns x1 per side. Cued pt to wait for symptoms to subside prior to continuing head turns.               PT Education - 05/08/16 1834    Education provided Yes   Education Details HEP: progressed seated horizontal head turns to standing head turns.    Person(s) Educated Patient   Methods Explanation;Demonstration   Comprehension Verbalized understanding;Returned demonstration          PT Short Term Goals - 04/21/16 1056      PT SHORT TERM GOAL #1   Title Patient verbalizes understanding of energy conservation with activities with MS. (Target Date: 04/21/2016)   Baseline met on 04/17/16   Status Achieved     PT SHORT TERM GOAL #2   Title Patient ambulates 300' with single point cane modified independent. (Target Date: 04/21/2016)   Baseline 04/17/16: pt able to ambulate up to 220 feet consecutively with rollator (no longer trying for cane use)   Status Partially Met     PT SHORT TERM GOAL #3   Title Patient negotiates ramps, curbs and stairs (1 rail) with single point cane with supervision. (Target Date: 04/21/2016) (No longer trying for cane; used rollator)   Baseline 04/17/16: min guard to min assist with rollator on ramp and curb; stairs met 04/21/16   Status Partially Met     PT SHORT TERM GOAL #4   Title Patient reaches 10" without balance loss. (Target Date: 04/21/2016)   Baseline GOAL MET: 04/21/16   Status Achieved     PT SHORT TERM GOAL #5   Title Patient reports 25% improvement in vertigo with sit to stand and supine to sit. (Target  Date: 04/21/2016)   Time 4   Period Weeks   Status On-going           PT Long Term Goals - 03/22/16 1546      PT LONG TERM GOAL #1   Title Berg Balance >36/56 to indicate lower fall risk. (Target Date: 05/19/2016)   Time 8   Period Weeks   Status New     PT LONG TERM GOAL #2   Title Patient ambulates 500' including grass, paved surfaces without device modified independent. (Target Date: 05/19/2016)   Time 8   Period Weeks   Status New     PT LONG TERM GOAL #3   Title Patient negotiates ramps,  curbs & stairs (1 rail) without device modified independent. (Target Date: 05/19/2016)   Time 8   Period Weeks   Status New     PT LONG TERM GOAL #4   Title Timed Up-Go <12 seconds without device independently. (Target Date: 05/19/2016)   Time 8   Period Weeks   Status New     PT LONG TERM GOAL #5   Title Patient reports 50% improvement in dizziness with sit to/from stand, supine to/from sit and rolling in bed. (Target Date: 05/19/2016)   Time 8   Period Weeks   Status New     Additional Long Term Goals   Additional Long Term Goals Yes     PT LONG TERM GOAL #6   Title Patient verbalizes understanding of energy conservation & exercising with MS / independent in HEP - ongoing fitness plan. (Target Date: 05/19/2016)   Time 8   Period Weeks               Plan - 05/08/16 1847    Clinical Impression Statement Session focused on gait training with [facility] and [facility] and practicing loading/unloading rollator from pt's personal car. Pt required close supervision for stability/balance while loading/unloading rollator to/from trunk of SUV.  During gait training, pt prefered [facility] to [facility] and required close supervision to [place] with both devices.   Rehab Potential Good   Clinical Impairments Affecting Rehab Potential Fear, first exacerbation of MS   PT Frequency 2x / week   PT Duration 8 weeks   PT Treatment/Interventions ADLs/Self Care Home Management;DME Instruction;Gait  training;Stair training;Functional mobility training;Therapeutic activities;Therapeutic exercise;Balance training;Neuromuscular re-education;Patient/family education;Vestibular;Canalith Repostioning   PT Next Visit Plan Gait training with [facility].  Continue core and B hip strengthening and standing balance. If time, review loading rollator in/out of car   PT Home Exercise Plan Bridging, sit to stands, standing horizontal head turns for habituation   Consulted and Agree with Plan of Care Patient      Patient will benefit from skilled therapeutic intervention in order to improve the following deficits and impairments:  Abnormal gait, Decreased activity tolerance, Decreased balance, Decreased knowledge of use of DME, Decreased mobility, Dizziness  Visit Diagnosis: Other abnormalities of gait and mobility  Unsteadiness on feet  Dizziness and giddiness     Problem List Patient Active Problem List   Diagnosis Date Noted  . Diplopia 02/29/2016  . Memory disorder 02/29/2016  . Abnormality of gait 11/25/2014  . Multiple sclerosis ([place]) 11/21/2013  . Neurogenic bladder 11/21/2013  . Headache, tension type, chronic 11/17/2011  . Hot flashes 11/17/2011  . Essential hypertension, benign 09/20/2011  . Other screening mammogram 08/18/2011  . Routine general medical examination at a health care facility 08/18/2011    Billie Ruddy, PT, DPT [facility] [address] [place], [place], [address] Phone: [phone]   Fax:  [phone] 05/08/16, 6:51 PM  Name: Connie Robertson MRN: [id number] Date of Birth: [birth year]

## 2016-05-08 NOTE — Patient Instructions (Signed)
Turn    Stand with your back to a corner with a stable chair (or locker rollator, rolling walker) in front of you. Slowly turn your head from right to left (once per side), then rest with head still until dizziness subsides. Perform 5 reps, __2__ times per day.  Copyright  V[employer]. All rights reserved.

## 2016-05-10 ENCOUNTER — Telehealth: Payer: Self-pay | Admitting: Neurology

## 2016-05-10 NOTE — Telephone Encounter (Signed)
Connie Robertson [phone] request refill for acthar 70ml.

## 2016-05-10 NOTE — Telephone Encounter (Signed)
Pt does not need refill at this time

## 2016-05-11 ENCOUNTER — Ambulatory Visit: Payer: Medicare Other | Admitting: Physical Therapy

## 2016-05-12 ENCOUNTER — Other Ambulatory Visit: Payer: Self-pay | Admitting: Obstetrics & Gynecology

## 2016-05-12 DIAGNOSIS — N644 Mastodynia: Secondary | ICD-10-CM

## 2016-05-15 ENCOUNTER — Telehealth: Payer: Self-pay | Admitting: *Deleted

## 2016-05-15 ENCOUNTER — Encounter: Payer: Self-pay | Admitting: Physical Therapy

## 2016-05-15 ENCOUNTER — Ambulatory Visit: Payer: Medicare Other | Admitting: Physical Therapy

## 2016-05-15 DIAGNOSIS — R42 Dizziness and giddiness: Secondary | ICD-10-CM

## 2016-05-15 DIAGNOSIS — R2689 Other abnormalities of gait and mobility: Secondary | ICD-10-CM

## 2016-05-15 DIAGNOSIS — R2681 Unsteadiness on feet: Secondary | ICD-10-CM

## 2016-05-15 MED ORDER — GABAPENTIN 300 MG PO CAPS: 300.0000 mg | ORAL_CAPSULE | Freq: Two times a day (BID) | ORAL | 3 refills | Status: DC

## 2016-05-15 NOTE — Telephone Encounter (Signed)
Returned call to pt. C/o itching and pain to breast and back. Said that she was evaluated by GYN and does have a mammogram scheduled. Reports that she has not been taking gabapentin that was prescribed for nerve pain. 90 day refill e-scribed to pharmacy. Pt agreed to go back to taking gabapentin 300 mg BID as prescribed. F/u appt scheduled for next Friday.

## 2016-05-15 NOTE — Therapy (Signed)
[facility] [address] [place], [place], [address] Phone: [phone]   Fax:  [phone]  Physical Therapy Treatment  Patient Details  Name: Connie Robertson MRN: [id number] Date of Birth: 20-May-1971 Referring Provider: Elon Alas. Jannifer Franklin, MD  Encounter Date: 05/15/2016      PT End of Session - 05/15/16 1239    Visit Number 12   Number of Visits 17   Date for PT Re-Evaluation 05/22/06   Authorization Type [employer] Medicare G-Code & Progress report   PT Start Time 1234   PT Stop Time 1316   PT Time Calculation (min) 42 min   Equipment Utilized During Treatment Gait belt   Activity Tolerance Patient tolerated treatment well   Behavior During Therapy WFL for tasks assessed/performed      Past Medical History:  Diagnosis Date  . Abnormality of gait 11/25/2014  . Chronic daily headache 2003   Love  . Depression   . Memory disorder 02/29/2016  . MS (multiple sclerosis) (HCC) 1992   Dr. Erling Cruz  . Multiple sclerosis ([place]) 11/21/2013  . Neurogenic bladder 11/21/2013    Past Surgical History:  Procedure Laterality Date  . ABLATION COLPOCLESIS  2004  . CESAREAN SECTION    . TUBAL LIGATION      There were no vitals filed for this visit.      Subjective Assessment - 05/15/16 1238    Subjective No new complaints. Reports her headache is better today. No falls to report.    Pertinent History HTN   Limitations Standing;Walking   Currently in Pain? Yes   Pain Score 4    Pain Location Head   Pain Descriptors / Indicators Headache   Pain Type Chronic pain   Pain Onset More than a month ago   Pain Frequency Constant   Aggravating Factors  standing up/walking   Pain Relieving Factors sitting/resting     Treatment: Exercises: Hook lying: cues and assist needed for ex form/technique  Bridge, 5 sec hold x 10 reps Bridge with legs over red pball x 10 reps Hamstring curls with red pball x 10 reps  Seated at edge of  mat: cues on form and technique With red thera band Hamstring curls x 10 reps each side Long arc quads x 10 reps each side Alternating marching x 10 reps each side         [facility] Adult PT Treatment/Exercise - 05/15/16 1257      Transfers   Transfers Sit to Stand;Stand to Sit   Sit to Stand 6: Modified independent (Device/Increase time);With upper extremity assist;From bed;From chair/3-in-1   Stand to Sit 6: Modified independent (Device/Increase time);With upper extremity assist;To bed;To chair/3-in-1     Ambulation/Gait   Ambulation/Gait Yes   Ambulation/Gait Assistance 4: Min guard   Ambulation/Gait Assistance Details cues on posture, step length, lateral weight shifting ot stance leg and on sequencing with cane.   Ambulation Distance (Feet) 115 Feet  x2   Assistive device Straight cane  with rubber quad tip   Gait Pattern Step-through pattern;Decreased hip/knee flexion - right;Decreased hip/knee flexion - left;Wide base of support;Decreased stride length;Trendelenburg   Ambulation Surface Level;Indoor                  PT Short Term Goals - 04/21/16 1056      PT SHORT TERM GOAL #1   Title Patient verbalizes understanding of energy conservation with activities with MS. (Target Date: 04/21/2016)   Baseline met on 04/17/16  Status Achieved     PT SHORT TERM GOAL #2   Title Patient ambulates 300' with single point cane modified independent. (Target Date: 04/21/2016)   Baseline 04/17/16: pt able to ambulate up to 220 feet consecutively with rollator (no longer trying for cane use)   Status Partially Met     PT SHORT TERM GOAL #3   Title Patient negotiates ramps, curbs and stairs (1 rail) with single point cane with supervision. (Target Date: 04/21/2016) (No longer trying for cane; used rollator)   Baseline 04/17/16: min guard to min assist with rollator on ramp and curb; stairs met 04/21/16   Status Partially Met     PT SHORT TERM GOAL #4   Title Patient reaches 10"  without balance loss. (Target Date: 04/21/2016)   Baseline GOAL MET: 04/21/16   Status Achieved     PT SHORT TERM GOAL #5   Title Patient reports 25% improvement in vertigo with sit to stand and supine to sit. (Target Date: 04/21/2016)   Time 4   Period Weeks   Status On-going           PT Long Term Goals - 03/22/16 1546      PT LONG TERM GOAL #1   Title Berg Balance >36/56 to indicate lower fall risk. (Target Date: 05/19/2016)   Time 8   Period Weeks   Status New     PT LONG TERM GOAL #2   Title Patient ambulates 500' including grass, paved surfaces without device modified independent. (Target Date: 05/19/2016)   Time 8   Period Weeks   Status New     PT LONG TERM GOAL #3   Title Patient negotiates ramps, curbs & stairs (1 rail) without device modified independent. (Target Date: 05/19/2016)   Time 8   Period Weeks   Status New     PT LONG TERM GOAL #4   Title Timed Up-Go <12 seconds without device independently. (Target Date: 05/19/2016)   Time 8   Period Weeks   Status New     PT LONG TERM GOAL #5   Title Patient reports 50% improvement in dizziness with sit to/from stand, supine to/from sit and rolling in bed. (Target Date: 05/19/2016)   Time 8   Period Weeks   Status New     Additional Long Term Goals   Additional Long Term Goals Yes     PT LONG TERM GOAL #6   Title Patient verbalizes understanding of energy conservation & exercising with MS / independent in HEP - ongoing fitness plan. (Target Date: 05/19/2016)   Time 8   Period Weeks           Plan - 05/15/16 1239    Clinical Impression Statement today's session continued to focus on gait trainging with straight cane with rubber quad tip and on LE strengthening. No issues reported with session. Pt is making steady progress toward goals and should benefit from continued PT to progress toward unmet goals.,    Rehab Potential Good   Clinical Impairments Affecting Rehab Potential Fear, first exacerbation  of MS   PT Frequency 2x / week   PT Duration 8 weeks   PT Treatment/Interventions ADLs/Self Care Home Management;DME Instruction;Gait training;Stair training;Functional mobility training;Therapeutic activities;Therapeutic exercise;Balance training;Neuromuscular re-education;Patient/family education;Vestibular;Canalith Repostioning   PT Next Visit Plan check LTGs due this week/next session with primary PT to renew,    PT Home Exercise Plan Bridging, sit to stands, standing horizontal head turns for habituation   Consulted and Agree  with Plan of Care Patient      Patient will benefit from skilled therapeutic intervention in order to improve the following deficits and impairments:  Abnormal gait, Decreased activity tolerance, Decreased balance, Decreased knowledge of use of DME, Decreased mobility, Dizziness  Visit Diagnosis: Other abnormalities of gait and mobility  Unsteadiness on feet  Dizziness and giddiness     Problem List Patient Active Problem List   Diagnosis Date Noted  . Diplopia 02/29/2016  . Memory disorder 02/29/2016  . Abnormality of gait 11/25/2014  . Multiple sclerosis ([place]) 11/21/2013  . Neurogenic bladder 11/21/2013  . Headache, tension type, chronic 11/17/2011  . Hot flashes 11/17/2011  . Essential hypertension, benign 09/20/2011  . Other screening mammogram 08/18/2011  . Routine general medical examination at a health care facility 08/18/2011    Willow Ora, PTA, [facility] [address] [place], [place] [address] [phone] 05/15/16, 2:03 PM   Name: Connie Robertson MRN: [id number] Date of Birth: 19-May-1971

## 2016-05-17 ENCOUNTER — Ambulatory Visit: Payer: Medicare Other | Admitting: Neurology

## 2016-05-17 ENCOUNTER — Ambulatory Visit: Payer: Medicare Other | Admitting: Physical Therapy

## 2016-05-18 ENCOUNTER — Other Ambulatory Visit: Payer: Self-pay | Admitting: Adult Health

## 2016-05-18 ENCOUNTER — Ambulatory Visit: Payer: Medicare Other | Admitting: Physical Therapy

## 2016-05-18 ENCOUNTER — Telehealth: Payer: Self-pay | Admitting: Neurology

## 2016-05-18 MED ORDER — SOLIFENACIN SUCCINATE 10 MG PO TABS: 10.0000 mg | ORAL_TABLET | Freq: Every day | ORAL | 3 refills | Status: DC

## 2016-05-18 NOTE — Telephone Encounter (Signed)
-----   Message from Donnelly AngelicaJennifer L Hogan, RN sent at 05/18/2016  3:01 PM EDT ----- Regarding: Med Management MS pt here today for infusion. Asking about increasing dose of Vesicare d/t difficulty w/ urinary retention and frequency. Please advise. Thank you!

## 2016-05-18 NOTE — Telephone Encounter (Signed)
I called patient. The patient may go up to the 10 mg Vesicare dosing. I will call in a prescription. She is also reporting itching on the left chest and left back, may be neurologic in nature, she will be seen on October 20.

## 2016-05-22 ENCOUNTER — Ambulatory Visit: Payer: Medicare Other | Admitting: Physical Therapy

## 2016-05-22 ENCOUNTER — Encounter: Payer: Self-pay | Admitting: Physical Therapy

## 2016-05-22 DIAGNOSIS — R42 Dizziness and giddiness: Secondary | ICD-10-CM

## 2016-05-22 DIAGNOSIS — R2689 Other abnormalities of gait and mobility: Secondary | ICD-10-CM | POA: Diagnosis not present

## 2016-05-22 DIAGNOSIS — R2681 Unsteadiness on feet: Secondary | ICD-10-CM

## 2016-05-22 NOTE — Therapy (Signed)
[facility] [address] [place], [place], [address] Phone: [phone]   Fax:  [phone]  Physical Therapy Treatment  Patient Details  Name: Connie Robertson MRN: [id number] Date of Birth: Dec 22, 1970 Referring Provider: Elon Alas. Jannifer Franklin, MD  Encounter Date: 05/22/2016      PT End of Session - 05/22/16 1014    Visit Number 13   Number of Visits 20   Date for PT Re-Evaluation 07/14/16   Authorization Type [employer] Medicare G-Code & Progress report   PT Start Time 0802   PT Stop Time 0846   PT Time Calculation (min) 44 min   Equipment Utilized During Treatment Gait belt   Activity Tolerance Patient tolerated treatment well   Behavior During Therapy WFL for tasks assessed/performed      Past Medical History:  Diagnosis Date  . Abnormality of gait 11/25/2014  . Chronic daily headache 2003   Love  . Depression   . Memory disorder 02/29/2016  . MS (multiple sclerosis) (HCC) 1992   Dr. Erling Cruz  . Multiple sclerosis ([place]) 11/21/2013  . Neurogenic bladder 11/21/2013    Past Surgical History:  Procedure Laterality Date  . ABLATION COLPOCLESIS  2004  . CESAREAN SECTION    . TUBAL LIGATION      There were no vitals filed for this visit.      Subjective Assessment - 05/22/16 0806    Subjective Pt reports that she has some pain in her right leg and that her left leg feels "like jello sometimes".   Pertinent History HTN   Limitations Standing;Walking   Patient Stated Goals To get back to previous to exacerbation level including exercise class   Currently in Pain? Yes   Pain Score 6    Pain Location Leg   Pain Orientation Right   Pain Descriptors / Indicators Tightness   Pain Type Chronic pain   Pain Onset More than a month ago   Pain Frequency Constant   Multiple Pain Sites No            [facility] PT Assessment - 05/22/16 0800      Ambulation/Gait   Ambulation Distance (Feet) 500 Feet  1x500' with cane,  2x100' without device   Ramp Details (indicate cue type and reason) MinA without SPC; close supervision with SPC due to unsteadiness   Curb Details (indicate cue type and reason) MinA without SPC; close supervision with SPC due to unsteadiness     Standardized Balance Assessment   Standardized Balance Assessment Berg Balance Test;Timed Up and Go Test     Berg Balance Test   Sit to Stand Able to stand without using hands and stabilize independently   Standing Unsupported Able to stand safely 2 minutes   Sitting with Back Unsupported but Feet Supported on Floor or Stool Able to sit safely and securely 2 minutes   Stand to Sit Sits safely with minimal use of hands   Transfers Able to transfer with verbal cueing and /or supervision   Standing Unsupported with Eyes Closed Able to stand 10 seconds with supervision   Standing Ubsupported with Feet Together Able to place feet together independently and stand for 1 minute with supervision   From Standing, Reach Forward with Outstretched Arm Can reach confidently >25 cm (10")   From Standing Position, Pick up Object from Floor Able to pick up shoe, needs supervision   From Standing Position, Turn to Look Behind Over each Shoulder Looks behind from both sides and weight  shifts well   Turn 360 Degrees Needs close supervision or verbal cueing   Standing Unsupported, Alternately Place Feet on Step/Stool Able to complete >2 steps/needs minimal assist   Standing Unsupported, One Foot in Front Able to take small step independently and hold 30 seconds   Standing on One Leg Tries to lift leg/unable to hold 3 seconds but remains standing independently   Total Score 40   Berg comment: Indicates significant fall risk     Timed Up and Go Test   Normal TUG (seconds) 14.03  Indicates high fall risk; with [facility]     Functional Gait  Assessment   Gait assessed  Yes   Gait Level Surface Walks 20 ft, slow speed, abnormal gait pattern, evidence for imbalance or  deviates 10-15 in outside of the 12 in walkway width. Requires more than 7 sec to ambulate 20 ft.   Change in Gait Speed Makes only minor adjustments to walking speed, or accomplishes a change in speed with significant gait deviations, deviates 10-15 in outside the 12 in walkway width, or changes speed but loses balance but is able to recover and continue walking.  with cane   Gait with Horizontal Head Turns Performs head turns with moderate changes in gait velocity, slows down, deviates 10-15 in outside 12 in walkway width but recovers, can continue to walk.  with cane   Gait with Vertical Head Turns Performs task with moderate change in gait velocity, slows down, deviates 10-15 in outside 12 in walkway width but recovers, can continue to walk.  with cane   Gait and Pivot Turn Turns slowly, requires verbal cueing, or requires several small steps to catch balance following turn and stop  with cane   Step Over Obstacle Is able to step over one shoe box (4.5 in total height) but must slow down and adjust steps to clear box safely. May require verbal cueing.  with cane   Gait with Narrow Base of Support Ambulates less than 4 steps heel to toe or cannot perform without assistance.   Gait with Eyes Closed Cannot walk 20 ft without assistance, severe gait deviations or imbalance, deviates greater than 15 in outside 12 in walkway width or will not attempt task.  with cane   Ambulating Backwards Walks 20 ft, slow speed, abnormal gait pattern, evidence for imbalance, deviates 10-15 in outside 12 in walkway width.  with cane   Steps Cannot do safely.  requires min guard, flucuates b/w alt. & step-to   Total Score 7                     [facility] Adult PT Treatment/Exercise - 05/22/16 0800      Transfers   Transfers Sit to Stand;Stand to Sit;Stand Pivot Transfers   Sit to Stand 6: Modified independent (Device/Increase time);With upper extremity assist;Without upper extremity assist;From  bed;With armrests;From chair/3-in-1   Stand to Sit 6: Modified independent (Device/Increase time);With upper extremity assist;Without upper extremity assist;With armrests;To bed;To chair/3-in-1   Stand Pivot Transfers 5: Supervision;4: Min guard;With armrests     Ambulation/Gait   Ambulation/Gait Yes   Ambulation/Gait Assistance 5: Supervision;4: Min guard   Assistive device Straight cane;None   Gait Pattern Step-through pattern;Decreased arm swing - left;Decreased arm swing - right;Decreased stride length;Decreased hip/knee flexion - right;Decreased hip/knee flexion - left;Trunk flexed;Wide base of support;Poor foot clearance - left;Poor foot clearance - right   Ambulation Surface Level;Unlevel;Indoor  Simulated grass due to bad weather   Gait velocity 1.89  ft/ sec  without SPC and minA; 2.09 ft/sec with SPC and min guard   Stairs Yes   Stairs Assistance 4: Min guard   Stair Management Technique One rail Right;Alternating pattern   Number of Stairs 4   Height of Stairs 6   Ramp 4: Min assist;5: Supervision   Curb 4: Min assist;5: Supervision                PT Education - 05/22/16 1013    Education provided Yes   Education Details Pt educated on how to endure [facility] is properly tightened to ensure safety and on future POC.   Person(s) Educated Patient   Methods Explanation;Demonstration;Verbal cues   Comprehension Verbalized understanding;Returned demonstration          PT Short Term Goals - 05/22/16 1357      PT SHORT TERM GOAL #1   Title Patient verbalizes understanding of energy conservation with activities with MS. (Target Date: 04/21/2016)   Baseline met on 04/17/16   Status Achieved     PT SHORT TERM GOAL #2   Title Patient ambulates 300' without AD with supervision. (Target Date: 04/21/2016) (New Target Date: 06/16/16)   Baseline 04/17/16: pt able to ambulate up to 220 feet consecutively with rollator    Status Revised     PT SHORT TERM GOAL #3   Title Patient  negotiates ramps, curbs and stairs (1 rail) with single point cane with mod I. (Target Date: 04/21/2016) (New Target Date: 06/16/16)   Baseline 04/17/16: min guard to min assist with rollator on ramp and curb; stairs met 04/21/16   Status Revised     PT SHORT TERM GOAL #4   Title Patient reaches 10" without balance loss. (Target Date: 04/21/2016)   Baseline GOAL MET: 04/21/16   Status Achieved     PT SHORT TERM GOAL #5   Title Patient reports 25% improvement in vertigo with sit to stand and supine to sit. (Target Date: 04/21/2016)   Baseline GOAL MET: 05/19/16 (LTG was 50% improvement)   Time 4   Period Weeks   Status Achieved     Additional Short Term Goals   Additional Short Term Goals Yes     PT SHORT TERM GOAL #6   Title Pt will improe Berg Balance score to > or = 43/56 to indicate improved static balance. (Target Date: 06/16/16)   Time 4   Period Weeks   Status New     PT SHORT TERM GOAL #7   Title Pt will improve TUG time to < 13.5 sec to indicate decr risk for falls. (Target Date: 06/16/16)   Time 4   Period Weeks   Status New           PT Long Term Goals - 05/22/16 1000      PT LONG TERM GOAL #1   Title Berg Balance >45/56 to indicate lower fall risk. (Target Date: 07/14/2016)   Baseline GOAL MET: 05/22/16 - 40/56   Time 8   Period Weeks   Status Revised     PT LONG TERM GOAL #2   Title Patient ambulates 500' including grass, paved surfaces, gravel, and inclines/declines without device modified independent. (Target Date: 05/19/2016) (New Target Date: 07/14/16)   Baseline Partially met: 05/22/16 - used SPC 300' and no AD 200' with min guard   Time 8   Period Weeks   Status On-going     PT LONG TERM GOAL #3   Title Patient negotiates ramps, curbs &  stairs (1 rail) without device modified independent. (Target Date: 05/19/2016) NEW TARGET DATE 07/14/2016   Baseline 05/22/16 - used SPC and minA   Time 8   Period Weeks   Status On-going     PT LONG TERM GOAL #4    Title Timed Up-Go <12 seconds without device independently. (Target Date: 05/19/2016)   Baseline 05/22/16 - 14.03 sec with SPC and min gurad   Time 8   Period Weeks   Status On-going     PT LONG TERM GOAL #5   Title Patient reports 50% improvement in dizziness with sit to/from stand, supine to/from sit and rolling in bed. (Target Date: 05/19/2016)   Baseline GOAL MET: 05/22/16   Time 8   Period Weeks   Status Achieved     Additional Long Term Goals   Additional Long Term Goals Yes     PT LONG TERM GOAL #6   Title Patient verbalizes application of energy conservation & exercising with MS / independent in HEP - ongoing fitness plan. (Target Date: 05/19/2016)  NEW TARGET DATE 07/14/2016   Baseline GOAL MET: 05/22/16 at verbalization level set at eval.  Revised to verbalizes application    Time 8   Period Weeks   Status Achieved     PT LONG TERM GOAL #7   Title Functional Gait Assessment > 14/30 to indicate lower fall risk. (Target Date: 07/14/2016)   Time 8   Period Weeks   Status New     PT LONG TERM GOAL #8   Title Timed Up-Go without device <13sec safely to indicate lower fall risk.  TARGET DATE 07/14/2016   Time 8   Period Weeks   Status New               Plan - 05/22/16 1016    Clinical Impression Statement Pt performed well in today's session and was able to meet several of her LTGs indicating improvement in her functional status.  Pt scored a 40/56 of the Berg Balance test (16/56 on 03/22/16) showing an improvement in static balance.  She also demonstrated a gait speed of 1.89 ft/ sec without SPC and 2.09 with SPC (1.71 ft/ sec on 03/22/16) indicating an improvement in functional mobility.  She continues to demonstrate a need for min A/min guard when negotiating stairs, ramps, and curbs without the [facility] with noted instabiity and postural sway that would prevent her from safely performing ADLs in the community.  She also continues to demonstrate incr fear and a decr in  confidence with walking and performing functional mobility tasks.  Due to decr functional gait, dynamic balance, and strength, pt would continue to beneift from skilled PT to address her remaining impairments.   Rehab Potential Good   Clinical Impairments Affecting Rehab Potential Fear, first exacerbation of MS   PT Frequency 1x / week   PT Duration 8 weeks   PT Treatment/Interventions ADLs/Self Care Home Management;DME Instruction;Gait training;Stair training;Functional mobility training;Therapeutic activities;Therapeutic exercise;Balance training;Neuromuscular re-education;Patient/family education;Vestibular;Canalith Repostioning;Manual techniques;Energy conservation   PT Next Visit Plan functional gait and balance, incr strength in B LE   PT Home Exercise Plan Bridging, sit to stands, standing horizontal head turns for habituation   Consulted and Agree with Plan of Care Patient      Patient will benefit from skilled therapeutic intervention in order to improve the following deficits and impairments:  Abnormal gait, Decreased activity tolerance, Decreased balance, Decreased knowledge of use of DME, Decreased mobility, Dizziness  Visit Diagnosis: Unsteadiness on feet  Other abnormalities of gait and mobility  Dizziness and giddiness     Problem List Patient Active Problem List   Diagnosis Date Noted  . Diplopia 02/29/2016  . Memory disorder 02/29/2016  . Abnormality of gait 11/25/2014  . Multiple sclerosis ([place]) 11/21/2013  . Neurogenic bladder 11/21/2013  . Headache, tension type, chronic 11/17/2011  . Hot flashes 11/17/2011  . Essential hypertension, benign 09/20/2011  . Other screening mammogram 08/18/2011  . Routine general medical examination at a health care facility 08/18/2011    Katherine Mantle, SPT 05/23/2016, 10:10 AM  Jamey Reas, PT, DPT PT Specializing in [place] 05/23/16 10:21 AM Phone:  [phone]  Fax:  [phone] [facility] [address] [place], [place] [address]  [facility] [address] [place], [place], [address] Phone: [phone]   Fax:  [phone]  Name: Connie Robertson MRN: [id number] Date of Birth: 02-21-1971

## 2016-05-23 ENCOUNTER — Ambulatory Visit: Payer: Medicare Other | Admitting: Physical Therapy

## 2016-05-25 ENCOUNTER — Ambulatory Visit
Admission: RE | Admit: 2016-05-25 | Discharge: 2016-05-25 | Disposition: A | Discharge status: Home / Self Care | Payer: Medicare Other | Source: Ambulatory Visit | Attending: Obstetrics & Gynecology | Admitting: Obstetrics & Gynecology

## 2016-05-25 DIAGNOSIS — N644 Mastodynia: Secondary | ICD-10-CM

## 2016-05-26 ENCOUNTER — Encounter: Payer: Self-pay | Admitting: Neurology

## 2016-05-26 ENCOUNTER — Ambulatory Visit (INDEPENDENT_AMBULATORY_CARE_PROVIDER_SITE_OTHER): Payer: Medicare Other | Admitting: Neurology

## 2016-05-26 VITALS — BP 131/86 | HR 59 | Ht 64.5 in | Wt 219.5 lb

## 2016-05-26 DIAGNOSIS — N319 Neuromuscular dysfunction of bladder, unspecified: Secondary | ICD-10-CM

## 2016-05-26 DIAGNOSIS — Z5181 Encounter for therapeutic drug level monitoring: Secondary | ICD-10-CM | POA: Diagnosis not present

## 2016-05-26 DIAGNOSIS — R269 Unspecified abnormalities of gait and mobility: Secondary | ICD-10-CM | POA: Diagnosis not present

## 2016-05-26 DIAGNOSIS — R413 Other amnesia: Secondary | ICD-10-CM | POA: Diagnosis not present

## 2016-05-26 DIAGNOSIS — H539 Unspecified visual disturbance: Secondary | ICD-10-CM | POA: Diagnosis not present

## 2016-05-26 DIAGNOSIS — G35 Multiple sclerosis: Secondary | ICD-10-CM | POA: Diagnosis not present

## 2016-05-26 MED ORDER — PREDNISONE 10 MG (21) PO TBPK: ORAL_TABLET | ORAL | 0 refills | Status: DC

## 2016-05-26 NOTE — Patient Instructions (Signed)
   We will get you set up for solumedrol next week.

## 2016-05-26 NOTE — Progress Notes (Signed)
Reason for visit: Multiple sclerosis  Connie Robertson is an 45 y.o. female  History of present illness:  Connie Robertson is a 45 year old right-handed black female with a history of multiple sclerosis. The patient has recently been placed on Tysabri, she has gotten her second dose of this medication, she is tolerating medication well. She has had some itching sensations on the mid chest anteriorly and posteriorly, but this is improving over time. No rash was associated with this. She is on Vesicare for her bladder with some benefit. She is using a cane for ambulation, no recent falls have been noted. The patient has noted new symptoms of visual blurring on the left eye that began about 2 weeks ago. The patient went to her eye doctor, no intraocular abnormalities were noted. The patient denies any eye discomfort with turning her eyes up or down or side-to-side. The patient believes that the visual changes been slow and gradual. She returns for an evaluation.  Past Medical History:  Diagnosis Date  . Abnormality of gait 11/25/2014  . Chronic daily headache 2003   Love  . Depression   . Memory disorder 02/29/2016  . MS (multiple sclerosis) (HCC) 1992   Dr. Sandria Manly  . Multiple sclerosis (HCC) 11/21/2013  . Neurogenic bladder 11/21/2013    Past Surgical History:  Procedure Laterality Date  . ABLATION COLPOCLESIS  2004  . CESAREAN SECTION    . TUBAL LIGATION      Family History  Problem Relation Age of Onset  . Heart failure Father   . Multiple sclerosis Sister   . Cancer Neg Hx   . Diabetes Neg Hx   . Hyperlipidemia Neg Hx   . Hypertension Neg Hx   . Kidney disease Neg Hx   . Stroke Neg Hx     Social history:  reports that she has never smoked. She has never used smokeless tobacco. She reports that she drinks about 1.2 oz of alcohol per week . She reports that she does not use drugs.   No Known Allergies  Medications:  Prior to Admission medications   Medication Sig Start Date  End Date Taking? Authorizing Provider  ALPRAZolam Prudy Feeler) 0.5 MG tablet Take 2 tablets approximately 45 minutes prior to the MRI study, take a third tablet if needed. 02/29/16  Yes York Spaniel, MD  amitriptyline (ELAVIL) 25 MG tablet Take 2 tablets (50 mg total) by mouth at bedtime. 06/07/15  Yes Anson Fret, MD  amLODipine (NORVASC) 5 MG tablet Take 5 mg by mouth daily. 11/14/13  Yes Historical Provider, MD  amoxicillin (AMOXIL) 500 MG capsule Take 1 capsule (500 mg total) by mouth 3 (three) times daily. 03/17/16  Yes Tharon Aquas, PA  benazepril (LOTENSIN) 20 MG tablet Take 20 mg by mouth daily. 11/14/13  Yes Historical Provider, MD  gabapentin (NEURONTIN) 300 MG capsule Take 1 capsule (300 mg total) by mouth 2 (two) times daily. 05/15/16  Yes York Spaniel, MD  natalizumab (TYSABRI) 300 MG/15ML injection Inject into the vein.   Yes Historical Provider, MD  solifenacin (VESICARE) 10 MG tablet Take 1 tablet (10 mg total) by mouth daily. 05/18/16  Yes York Spaniel, MD  SUMAtriptan (IMITREX) 100 MG tablet Take 1 tablet (100 mg total) by mouth 2 (two) times daily as needed for migraine. 09/01/15  Yes York Spaniel, MD  topiramate (TOPAMAX) 50 MG tablet Take 4 tablets (200 mg total) by mouth at bedtime. 12/01/15  Yes Butch Penny, NP  valACYclovir (VALTREX) 1000 MG tablet Take 500 mg by mouth daily as needed (for outbreaks for 3 days).  11/02/15  Yes Historical Provider, MD  zaleplon (SONATA) 10 MG capsule Take 1 capsule (10 mg total) by mouth at bedtime as needed for sleep. Patient taking differently: Take 10 mg by mouth at bedtime.  03/01/16  Yes York Spanielharles K Kemisha Bonnette, MD    ROS:  Out of a complete 14 system review of symptoms, the patient complains only of the following symptoms, and all other reviewed systems are negative.  Visual blurring, left eye Gait disorder  Blood pressure 131/86, pulse (!) 59, height 5' 4.5" (1.638 m), weight 219 lb 8 oz (99.6 kg).  Physical  Exam  General: The patient is alert and cooperative at the time of the examination. The patient is moderately obese.  Skin: No significant peripheral edema is noted.   Neurologic Exam  Mental status: The patient is alert and oriented x 3 at the time of the examination. The patient has apparent normal recent and remote memory, with an apparently normal attention span and concentration ability.   Cranial nerves: Facial symmetry is present. Speech is normal, no aphasia or dysarthria is noted. Extraocular movements are full. Visual fields are full. Pupils are equal, round, and reactive to light. Discs are flat bilaterally. Discs are somewhat pale bilaterally.  Motor: The patient has good strength in all 4 extremities.  Sensory examination: Soft touch sensation is symmetric on the face and arms, slightly decreased on the left leg as compared to the right.  Coordination: The patient has good finger-nose-finger and heel-to-shin bilaterally.  Gait and station: The patient has a slightly wide-based gait, the patient walks with a cane. Tandem gait was not attempted. Romberg is negative.  Reflexes: Deep tendon reflexes are symmetric.   Assessment/Plan:  1. Multiple sclerosis  2. Gait disorder  3. Neurogenic bladder  4. Left eye visual disturbance, possible optic neuritis  The patient has had a gradual change in vision the left eye over the last 2 weeks. The patient will be sent for blood work today, a visual evoked response test will be done, she will be set up for a three-day course of Solu-Medrol getting 500 mg daily. She will then go on a prednisone Dosepak, 10 mg 12 day pack. She will follow-up in 3 months, sooner if needed.  Marlan Palau. Keith Rahul Malinak MD 05/26/2016 12:26 PM  [facility] [address] [place], [place] [id number]  Phone [phone] Fax [phone]

## 2016-05-29 ENCOUNTER — Telehealth: Payer: Self-pay

## 2016-05-29 ENCOUNTER — Other Ambulatory Visit: Payer: Self-pay | Admitting: Neurology

## 2016-05-29 DIAGNOSIS — G35 Multiple sclerosis: Secondary | ICD-10-CM

## 2016-05-29 LAB — CBC WITH DIFFERENTIAL/PLATELET
BASOS: 0 %
Basophils Absolute: 0 10*3/uL (ref 0.0–0.2)
EOS (ABSOLUTE): 0.1 10*3/uL (ref 0.0–0.4)
EOS: 4 %
HEMATOCRIT: 35.8 % (ref 34.0–46.6)
HEMOGLOBIN: 12.3 g/dL (ref 11.1–15.9)
Immature Grans (Abs): 0 10*3/uL (ref 0.0–0.1)
Immature Granulocytes: 0 %
LYMPHS ABS: 1.4 10*3/uL (ref 0.7–3.1)
Lymphs: 41 %
MCH: 31.2 pg (ref 26.6–33.0)
MCHC: 34.4 g/dL (ref 31.5–35.7)
MCV: 91 fL (ref 79–97)
MONOCYTES: 12 %
Monocytes Absolute: 0.4 10*3/uL (ref 0.1–0.9)
Neutrophils Absolute: 1.5 10*3/uL (ref 1.4–7.0)
Neutrophils: 43 %
Platelets: 198 10*3/uL (ref 150–379)
RBC: 3.94 x10E6/uL (ref 3.77–5.28)
RDW: 13.4 % (ref 12.3–15.4)
WBC: 3.4 10*3/uL (ref 3.4–10.8)

## 2016-05-29 LAB — COMPREHENSIVE METABOLIC PANEL
ALBUMIN: 4.3 g/dL (ref 3.5–5.5)
ALT: 21 IU/L (ref 0–32)
AST: 21 IU/L (ref 0–40)
Albumin/Globulin Ratio: 1.9 (ref 1.2–2.2)
Alkaline Phosphatase: 52 IU/L (ref 39–117)
BILIRUBIN TOTAL: 0.3 mg/dL (ref 0.0–1.2)
BUN / CREAT RATIO: 15 (ref 9–23)
BUN: 16 mg/dL (ref 6–24)
CO2: 24 mmol/L (ref 18–29)
CREATININE: 1.1 mg/dL — AB (ref 0.57–1.00)
Calcium: 9.3 mg/dL (ref 8.7–10.2)
Chloride: 107 mmol/L — ABNORMAL HIGH (ref 96–106)
GFR calc non Af Amer: 60 mL/min/{1.73_m2} (ref >59)
GFR, EST AFRICAN AMERICAN: 69 mL/min/{1.73_m2} (ref >59)
GLUCOSE: 90 mg/dL (ref 65–99)
Globulin, Total: 2.3 g/dL (ref 1.5–4.5)
Potassium: 4 mmol/L (ref 3.5–5.2)
Sodium: 145 mmol/L — ABNORMAL HIGH (ref 134–144)
TOTAL PROTEIN: 6.6 g/dL (ref 6.0–8.5)

## 2016-05-29 LAB — NEUROMYELITIS OPTICA AUTOAB, IGG: NMO IgG Autoantibodies: <1.5 U/mL (ref 0.0–3.0)

## 2016-05-29 NOTE — Telephone Encounter (Signed)
Solumedrol orders, demographics, insurance info faxed to [facility] Stay F # [phone]. Requested that pt be scheduled this week (if possible).

## 2016-05-29 NOTE — Progress Notes (Signed)
Orders for Solu-Medrol 500 mg daily for 3 days will be entered, patient to have done at [facility].

## 2016-05-29 NOTE — Telephone Encounter (Signed)
Dee/ [facility] called asking if next week will be ok. [phone]

## 2016-05-29 NOTE — Telephone Encounter (Signed)
-----   Message from York Spaniel, MD sent at 05/29/2016  9:12 AM EDT ----- Regarding: RE: Solumedrol I have put orders in for Solu-Medrol, the patient is to take a prednisone Dosepak after the 3 day course of Solu-Medrol, please set up at [facility] ----- Message ----- From: Donnelly Angelica, RN Sent: 05/29/2016   8:40 AM To: York Spaniel, MD Subject: Solumedrol                                     [facility] says that saline is on back order so none available for solumedrol infusions this week. Would you like to place pt on high dose taper dose pack or send her to [facility] for infusions instead?

## 2016-05-30 ENCOUNTER — Ambulatory Visit: Payer: Medicare Other | Admitting: Physical Therapy

## 2016-05-30 ENCOUNTER — Telehealth: Payer: Self-pay

## 2016-05-30 NOTE — Telephone Encounter (Signed)
-----   Message from York Spanielharles K Willis, MD sent at 05/29/2016  5:47 PM EDT -----  The blood work results are unremarkable, with exception that the sodium level is slightly elevated, not clinically significant. Please call the patient. ----- Message ----- From: Nell RangeInterface, [facility] Lab Results In Sent: 05/27/2016   7:41 AM To: York Spanielharles K Willis, MD

## 2016-05-30 NOTE — Telephone Encounter (Signed)
I spoke to pt. I advised her that her labs were unremarkable except for a slightly elevated sodium which is clinically insignificant. Pt verbalized understanding of results.  Pt asked about her solu-medrol infusion. I advised her that we cannot do the infusion here at [facility] because our saline is on back order, but [facility] Stay will be able to do the infusion. I gave pt the phone number to [facility] Short Stay. Pt verbalized understanding. Pt had no further questions at this time but was encouraged to call back if questions arise.

## 2016-05-30 NOTE — Telephone Encounter (Signed)
FYI-Called patient back today to try to schedule.  She she advised that she again is not at home and will call back tomorrow, 05/30/2016, to schedule this VER study.

## 2016-05-30 NOTE — Telephone Encounter (Signed)
Pt called back request RN to call

## 2016-05-30 NOTE — Telephone Encounter (Signed)
I called this patient AFTER she cancelled her VER that I scheduled with her the other day.  I asked pt if she would like to reschedule with me on the phone.  She stated that she did not have her calendar with her and would have to call us back.  I will call her again.

## 2016-05-30 NOTE — Telephone Encounter (Signed)
I called pt. She said that she could not remember what I had said regarding her labs. I explained them to her again. She says that she is scheduled next week for her solu-medrol infusion x 3 days. She is wondering when the visual evoked potential test will be scheduled? I advised her that our office will call her to schedule the VEP. Pt verbalized understanding.

## 2016-05-31 NOTE — Telephone Encounter (Signed)
Returned call to [place] at [place]. Let her know that next week is fine if that is soonest available appt for Solumedrol infusions. May call back w/ further questions/concerns.

## 2016-05-31 NOTE — Telephone Encounter (Addendum)
Spoke to Connie [facility] at [facility] re: Solumedrol infusions. Called pt w/ appt days and times: 11/1 @ 11 am, 11/2 @ 10 am and 11/3 @ 10 am. She will call back tomorrow to r/s VEP w/ Connie Robertson.

## 2016-06-01 ENCOUNTER — Ambulatory Visit: Payer: Medicare Other | Admitting: Physical Therapy

## 2016-06-06 ENCOUNTER — Ambulatory Visit: Payer: Medicare Other | Admitting: Physical Therapy

## 2016-06-07 ENCOUNTER — Encounter (HOSPITAL_COMMUNITY)
Admission: RE | Admit: 2016-06-07 | Discharge: 2016-06-07 | Disposition: A | Discharge status: Home / Self Care | Payer: Medicare Other | Source: Ambulatory Visit | Attending: Neurology | Admitting: Neurology

## 2016-06-07 ENCOUNTER — Encounter (HOSPITAL_COMMUNITY): Payer: Self-pay

## 2016-06-07 ENCOUNTER — Other Ambulatory Visit (HOSPITAL_COMMUNITY): Payer: Self-pay | Admitting: Neurology

## 2016-06-07 DIAGNOSIS — G35 Multiple sclerosis: Secondary | ICD-10-CM

## 2016-06-07 MED ORDER — SODIUM CHLORIDE 0.9 % IV SOLN
Freq: Every day | INTRAVENOUS | Status: DC
  Administered 2016-06-07: 11:00:00 via INTRAVENOUS

## 2016-06-07 MED ORDER — SODIUM CHLORIDE 0.9 % IV SOLN
500.0000 mg | Freq: Every day | INTRAVENOUS | Status: DC
  Administered 2016-06-07: 500 mg via INTRAVENOUS
  Filled 2016-06-07 (×2): qty 4

## 2016-06-07 NOTE — Telephone Encounter (Signed)
Attempted to return pt's call but received mssg stating that she cannot except calls at this time. Unable to leave VM. Dr. Anne Hahn ordered Solu-medrol infusions x 3 days followed by a course of oral prednisone due to vision changes assessed at her last OV. Pt needs to go ahead and get infusions at [facility]. IV fluids are not currently available for administration at [facility].

## 2016-06-07 NOTE — Telephone Encounter (Signed)
Pt called to advise she is confused as to what this infusion is for.  She is wanting to possibly wait until the medication can be shipped to the clinic. Please call at  [phone]

## 2016-06-07 NOTE — Discharge Instructions (Signed)
Methylprednisolone Solution for Injection  What is this medicine?  METHYLPREDNISOLONE (meth ill pred NISS oh lone) is a corticosteroid. It is commonly used to treat inflammation of the skin, joints, lungs, and other organs. Common conditions treated include asthma, allergies, and arthritis. It is also used for other conditions, such as blood disorders and diseases of the adrenal glands.  This medicine may be used for other purposes; ask your health care provider or pharmacist if you have questions.  What should I tell my health care provider before I take this medicine?  They need to know if you have any of these conditions:  -cataracts or glaucoma  -Cushing's syndrome  -heart disease  -high blood pressure  -infection including tuberculosis  -low calcium or potassium levels in the blood  -recent surgery  -seizures  -stomach or intestinal disease, including colitis  -threadworms  -thyroid problems  -an unusual or allergic reaction to methylprednisolone, corticosteroids, benzyl alcohol, other medicines, foods, dyes, or preservatives  -pregnant or trying to get pregnant  -breast-feeding  How should I use this medicine?  This medicine is for injection or infusion into a vein. It is also for injection into a muscle. It is given by a health care professional in a hospital or clinic setting.  Talk to your pediatrician regarding the use of this medicine in children. While this drug may be prescribed for selected conditions, precautions do apply.  Overdosage: If you think you have taken too much of this medicine contact a poison control center or emergency room at once.  NOTE: This medicine is only for you. Do not share this medicine with others.  What if I miss a dose?  This does not apply.  What may interact with this medicine?  Do not take this medicine with any of the following medications:  -mifepristone  This medicine may also interact with the following medications:  -aspirin and aspirin-like  medicines  -cyclosporin  -ketoconazole  -phenobarbital  -phenytoin  -rifampin  -tacrolimus  -troleandomycin  -vaccines  -warfarin  This list may not describe all possible interactions. Give your health care provider a list of all the medicines, herbs, non-prescription drugs, or dietary supplements you use. Also tell them if you smoke, drink alcohol, or use illegal drugs. Some items may interact with your medicine.  What should I watch for while using this medicine?  Visit your doctor or health care professional for regular checks on your progress. If you are taking this medicine for a long time, carry an identification card with your name and address, the type and dose of your medicine, and your doctor's name and address.  The medicine may increase your risk of getting an infection. Stay away from people who are sick. Tell your doctor or health care professional if you are around anyone with measles or chickenpox.  You may need to avoid some vaccines. Talk to your health care provider for more information.  If you are going to have surgery, tell your doctor or health care professional that you have taken this medicine within the last twelve months.  Ask your doctor or health care professional about your diet. You may need to lower the amount of salt you eat.  The medicine can increase your blood sugar. If you are a diabetic check with your doctor if you need help adjusting the dose of your diabetic medicine.  What side effects may I notice from receiving this medicine?  Side effects that you should report to your doctor or health care   professional as soon as possible:  -allergic reactions like skin rash, itching or hives, swelling of the face, lips, or tongue  -bloody or tarry stools  -changes in vision  -eye pain or bulging eyes  -fever, sore throat, sneezing, cough, or other signs of infection, wounds that will not heal  -increased thirst  -irregular heartbeat  -muscle cramps  -pain in hips, back, ribs, arms,  shoulders, or legs  -swelling of the ankles, feet, hands  -trouble passing urine or change in the amount of urine  -unusual bleeding or bruising  -unusually weak or tired  -weight gain or weight loss  Side effects that usually do not require medical attention (report to your doctor or health care professional if they continue or are bothersome):  -changes in emotions or moods  -constipation or diarrhea  -headache  -irritation at site where injected  -nausea, vomiting  -skin problems, acne, thin and shiny skin  -trouble sleeping  -unusual hair growth on the face or body  This list may not describe all possible side effects. Call your doctor for medical advice about side effects. You may report side effects to FDA at 1-800-FDA-1088.  Where should I keep my medicine?  This drug is given in a hospital or clinic and will not be stored at home.  NOTE: This sheet is a summary. It may not cover all possible information. If you have questions about this medicine, talk to your doctor, pharmacist, or health care provider.      2016, [employer]vier/[employer] Standard. (2012-04-23 11:37:16)

## 2016-06-07 NOTE — Telephone Encounter (Signed)
Called additional # per pt and left VM mssg for pt to return call re: infusions.

## 2016-06-08 ENCOUNTER — Encounter (HOSPITAL_COMMUNITY)
Admission: RE | Admit: 2016-06-08 | Discharge: 2016-06-08 | Disposition: A | Discharge status: Home / Self Care | Payer: Medicare Other | Source: Ambulatory Visit | Attending: Neurology | Admitting: Neurology

## 2016-06-08 ENCOUNTER — Encounter (HOSPITAL_COMMUNITY): Payer: Self-pay

## 2016-06-08 ENCOUNTER — Ambulatory Visit: Payer: Medicare Other | Attending: Neurology | Admitting: Physical Therapy

## 2016-06-08 DIAGNOSIS — R269 Unspecified abnormalities of gait and mobility: Secondary | ICD-10-CM | POA: Insufficient documentation

## 2016-06-08 DIAGNOSIS — R2689 Other abnormalities of gait and mobility: Secondary | ICD-10-CM | POA: Insufficient documentation

## 2016-06-08 DIAGNOSIS — R42 Dizziness and giddiness: Secondary | ICD-10-CM | POA: Insufficient documentation

## 2016-06-08 DIAGNOSIS — G35 Multiple sclerosis: Secondary | ICD-10-CM

## 2016-06-08 DIAGNOSIS — R2681 Unsteadiness on feet: Secondary | ICD-10-CM | POA: Insufficient documentation

## 2016-06-08 MED ORDER — SODIUM CHLORIDE 0.9 % IV SOLN
500.0000 mg | Freq: Every day | INTRAVENOUS | Status: DC
  Administered 2016-06-08: 500 mg via INTRAVENOUS
  Filled 2016-06-08 (×2): qty 500

## 2016-06-08 MED ORDER — SODIUM CHLORIDE 0.9 % IV SOLN
Freq: Every day | INTRAVENOUS | Status: DC
  Administered 2016-06-08: 11:00:00 via INTRAVENOUS

## 2016-06-09 ENCOUNTER — Encounter (HOSPITAL_COMMUNITY): Payer: Self-pay

## 2016-06-09 ENCOUNTER — Encounter (HOSPITAL_COMMUNITY)
Admission: RE | Admit: 2016-06-09 | Discharge: 2016-06-09 | Disposition: A | Discharge status: Home / Self Care | Payer: Medicare Other | Source: Ambulatory Visit | Attending: Neurology | Admitting: Neurology

## 2016-06-09 DIAGNOSIS — G35 Multiple sclerosis: Secondary | ICD-10-CM | POA: Diagnosis not present

## 2016-06-09 MED ORDER — SODIUM CHLORIDE 0.9 % IV SOLN
Freq: Every day | INTRAVENOUS | Status: AC
  Administered 2016-06-09: 10:00:00 via INTRAVENOUS

## 2016-06-09 MED ORDER — SODIUM CHLORIDE 0.9 % IV SOLN
500.0000 mg | Freq: Every day | INTRAVENOUS | Status: AC
  Administered 2016-06-09: 500 mg via INTRAVENOUS
  Filled 2016-06-09: qty 500

## 2016-06-09 NOTE — Discharge Instructions (Signed)
Methylprednisolone Solution for Injection  What is this medicine?  METHYLPREDNISOLONE (meth ill pred NISS oh lone) is a corticosteroid. It is commonly used to treat inflammation of the skin, joints, lungs, and other organs. Common conditions treated include asthma, allergies, and arthritis. It is also used for other conditions, such as blood disorders and diseases of the adrenal glands.  This medicine may be used for other purposes; ask your health care provider or pharmacist if you have questions.  What should I tell my health care provider before I take this medicine?  They need to know if you have any of these conditions:  -cataracts or glaucoma  -Cushing's syndrome  -heart disease  -high blood pressure  -infection including tuberculosis  -low calcium or potassium levels in the blood  -recent surgery  -seizures  -stomach or intestinal disease, including colitis  -threadworms  -thyroid problems  -an unusual or allergic reaction to methylprednisolone, corticosteroids, benzyl alcohol, other medicines, foods, dyes, or preservatives  -pregnant or trying to get pregnant  -breast-feeding  How should I use this medicine?  This medicine is for injection or infusion into a vein. It is also for injection into a muscle. It is given by a health care professional in a hospital or clinic setting.  Talk to your pediatrician regarding the use of this medicine in children. While this drug may be prescribed for selected conditions, precautions do apply.  Overdosage: If you think you have taken too much of this medicine contact a poison control center or emergency room at once.  NOTE: This medicine is only for you. Do not share this medicine with others.  What if I miss a dose?  This does not apply.  What may interact with this medicine?  Do not take this medicine with any of the following medications:  -mifepristone  This medicine may also interact with the following medications:  -aspirin and aspirin-like  medicines  -cyclosporin  -ketoconazole  -phenobarbital  -phenytoin  -rifampin  -tacrolimus  -troleandomycin  -vaccines  -warfarin  This list may not describe all possible interactions. Give your health care provider a list of all the medicines, herbs, non-prescription drugs, or dietary supplements you use. Also tell them if you smoke, drink alcohol, or use illegal drugs. Some items may interact with your medicine.  What should I watch for while using this medicine?  Visit your doctor or health care professional for regular checks on your progress. If you are taking this medicine for a long time, carry an identification card with your name and address, the type and dose of your medicine, and your doctor's name and address.  The medicine may increase your risk of getting an infection. Stay away from people who are sick. Tell your doctor or health care professional if you are around anyone with measles or chickenpox.  You may need to avoid some vaccines. Talk to your health care provider for more information.  If you are going to have surgery, tell your doctor or health care professional that you have taken this medicine within the last twelve months.  Ask your doctor or health care professional about your diet. You may need to lower the amount of salt you eat.  The medicine can increase your blood sugar. If you are a diabetic check with your doctor if you need help adjusting the dose of your diabetic medicine.  What side effects may I notice from receiving this medicine?  Side effects that you should report to your doctor or health care   professional as soon as possible:  -allergic reactions like skin rash, itching or hives, swelling of the face, lips, or tongue  -bloody or tarry stools  -changes in vision  -eye pain or bulging eyes  -fever, sore throat, sneezing, cough, or other signs of infection, wounds that will not heal  -increased thirst  -irregular heartbeat  -muscle cramps  -pain in hips, back, ribs, arms,  shoulders, or legs  -swelling of the ankles, feet, hands  -trouble passing urine or change in the amount of urine  -unusual bleeding or bruising  -unusually weak or tired  -weight gain or weight loss  Side effects that usually do not require medical attention (report to your doctor or health care professional if they continue or are bothersome):  -changes in emotions or moods  -constipation or diarrhea  -headache  -irritation at site where injected  -nausea, vomiting  -skin problems, acne, thin and shiny skin  -trouble sleeping  -unusual hair growth on the face or body  This list may not describe all possible side effects. Call your doctor for medical advice about side effects. You may report side effects to FDA at 1-800-FDA-1088.  Where should I keep my medicine?  This drug is given in a hospital or clinic and will not be stored at home.  NOTE: This sheet is a summary. It may not cover all possible information. If you have questions about this medicine, talk to your doctor, pharmacist, or health care provider.      2016, [employer]vier/[employer] Standard. (2012-04-23 11:37:16)

## 2016-06-13 ENCOUNTER — Ambulatory Visit: Payer: Medicare Other | Admitting: Physical Therapy

## 2016-06-15 ENCOUNTER — Ambulatory Visit: Payer: Medicare Other | Admitting: Physical Therapy

## 2016-06-15 DIAGNOSIS — R2689 Other abnormalities of gait and mobility: Secondary | ICD-10-CM | POA: Diagnosis present

## 2016-06-15 DIAGNOSIS — R2681 Unsteadiness on feet: Secondary | ICD-10-CM

## 2016-06-15 DIAGNOSIS — R269 Unspecified abnormalities of gait and mobility: Secondary | ICD-10-CM | POA: Diagnosis present

## 2016-06-15 DIAGNOSIS — R42 Dizziness and giddiness: Secondary | ICD-10-CM | POA: Diagnosis present

## 2016-06-15 NOTE — Therapy (Signed)
[facility] [address] [place], [place], [address] Phone: [phone]   Fax:  [phone]  Physical Therapy Treatment  Patient Details  Name: Connie Robertson MRN: [id number] Date of Birth: [birth year] Referring Provider: Elon Alas. Jannifer Franklin, MD  Encounter Date: 06/15/2016      PT End of Session - 06/15/16 1616    Visit Number 14   Number of Visits 20   Date for PT Re-Evaluation 07/14/16   Authorization Type [employer] Medicare G-Code & Progress report   PT Start Time 7564   PT Stop Time 1418   PT Time Calculation (min) 55 min   Activity Tolerance Patient tolerated treatment well   Behavior During Therapy WFL for tasks assessed/performed      Past Medical History:  Diagnosis Date  . Abnormality of gait 11/25/2014  . Chronic daily headache 2003   Love  . Depression   . Memory disorder 02/29/2016  . MS (multiple sclerosis) (HCC) 1992   Dr. Erling Cruz  . Multiple sclerosis ([place]) 11/21/2013  . Neurogenic bladder 11/21/2013    Past Surgical History:  Procedure Laterality Date  . ABLATION COLPOCLESIS  2004  . CESAREAN SECTION    . TUBAL LIGATION      There were no vitals filed for this visit.      Subjective Assessment - 06/15/16 1601    Subjective Pt arrived to session wearing high heel shoes, using SPC. States, "I don't know how I missed all those (PT) appointments. My memory isn't that great right now." Pt also reports increased imbalance since getting new glasses (bifocals) recently.  Pt borrowed clinic athletic shoes for this PT session   Pertinent History HTN   Limitations Standing;Walking   Patient Stated Goals To get back to previous to exacerbation level including exercise class   Currently in Pain? No/denies            [facility] PT Assessment - 06/15/16 0001      Standardized Balance Assessment   Standardized Balance Assessment Berg Balance Test     Berg Balance Test   Sit to Stand Able to stand  without using hands and stabilize independently   Standing Unsupported Able to stand safely 2 minutes   Sitting with Back Unsupported but Feet Supported on Floor or Stool Able to sit safely and securely 2 minutes   Stand to Sit Sits safely with minimal use of hands   Transfers Able to transfer safely, minor use of hands   Standing Unsupported with Eyes Closed Able to stand 10 seconds with supervision   Standing Ubsupported with Feet Together Able to place feet together independently and stand for 1 minute with supervision   From Standing, Reach Forward with Outstretched Arm Can reach confidently >25 cm (10")   From Standing Position, Pick up Object from Floor Able to pick up shoe, needs supervision   From Standing Position, Turn to Look Behind Over each Shoulder Looks behind one side only/other side shows less weight shift   Turn 360 Degrees Needs close supervision or verbal cueing   Standing Unsupported, Alternately Place Feet on Step/Stool Able to stand independently and complete 8 steps >20 seconds  26 seconds   Standing Unsupported, One Foot in Front Able to plae foot ahead of the other independently and hold 30 seconds   Standing on One Leg Tries to lift leg/unable to hold 3 seconds but remains standing independently   Total Score 44     Timed Up and Go Test  TUG Normal TUG   Normal TUG (seconds) 13.15  using SPC; close supervision during turning due to imbalance                     [facility] Adult PT Treatment/Exercise - 06/15/16 0001      Transfers   Transfers Sit to Stand;Stand to Sit   Sit to Stand 6: Modified independent (Device/Increase time)   Sit to Stand Details (indicate cue type and reason) able to stand from standard chair without UE use   Stand to Sit 6: Modified independent (Device/Increase time)     Ambulation/Gait   Ambulation/Gait Yes   Ambulation/Gait Assistance 5: Supervision   Ambulation/Gait Assistance Details Mod I for majority of linear gait;  supervision required for all turning (90, 180 degrees) and due to LOB when pt looked down toward floor.   Ambulation Distance (Feet) 300 Feet   Assistive device Straight cane   Gait Pattern Step-through pattern;Decreased stride length;Trunk flexed;Wide base of support;Decreased arm swing - right;Decreased arm swing - left   Ambulation Surface Level;Indoor   Stairs Yes   Stairs Assistance 5: Supervision   Stairs Assistance Details (indicate cue type and reason) cueing for sequencing, technique with SPC with effective return demo; requires supervision for stability/balance   Stair Management Technique One rail Right;Step to pattern;Forwards;With cane   Number of Stairs 8  4 stairs x2 reps   Height of Stairs 6   Ramp 5: Supervision   Ramp Details (indicate cue type and reason) using SPC   Curb 5: Supervision;4: Min assist   Curb Details (indicate cue type and reason) using SPC, pt required supervision to descend, min guard to ascend                PT Education - 06/15/16 1602    Education provided Yes   Education Details Explained importance of compliance with PT to maximize functional gains. STG findings, progress, and implications. Discussed footwear, increased risk of falling when walking in high heels. HEP: standing functional head turns for habituation.    Person(s) Educated Patient   Methods Explanation;Demonstration;Handout   Comprehension Verbalized understanding;Returned demonstration          PT Short Term Goals - 06/15/16 1327      PT SHORT TERM GOAL #1   Title Patient verbalizes understanding of energy conservation with activities with MS. (Target Date: 04/21/2016)   Baseline met on 04/17/16   Status Achieved     PT SHORT TERM GOAL #2   Title Patient ambulates 300' without AD with supervision. (Target Date: 04/21/2016) (New Target Date: 06/16/16)   Baseline Met 11/9.   Status Achieved     PT SHORT TERM GOAL #3   Title Patient negotiates ramps, curbs and stairs  (1 rail) with single point cane with mod I. (Target Date: 04/21/2016) (New Target Date: 06/16/16)   Baseline 11/9: using SPC, pt requires (S) for stair and ramp negotiation, supervision to descend curb, min guard to ascend curb   Status Not Met     PT SHORT TERM GOAL #4   Title Patient reaches 10" without balance loss. (Target Date: 04/21/2016)   Baseline GOAL MET: 04/21/16   Status Achieved     PT SHORT TERM GOAL #5   Title Patient reports 25% improvement in vertigo with sit to stand and supine to sit. (Target Date: 04/21/2016)   Baseline GOAL MET: 05/19/16 (LTG was 50% improvement)   Time 4   Period Weeks   Status Achieved  PT SHORT TERM GOAL #6   Title Pt will improve Berg Balance score to > or = 43/56 to indicate improved static balance. (Target Date: 06/16/16)   Baseline 11/9: Merrilee Jansky = 44/56   Time 4   Period Weeks   Status Achieved     PT SHORT TERM GOAL #7   Title Pt will improve TUG time to < 13.5 sec to indicate decr risk for falls. (Target Date: 06/16/16)   Baseline 11/9: TUG = 13.15 sec with SPC   Time 4   Period Weeks   Status Achieved           PT Long Term Goals - 05/22/16 1000      PT LONG TERM GOAL #1   Title Berg Balance >45/56 to indicate lower fall risk. (Target Date: 07/14/2016)   Baseline GOAL MET: 05/22/16 - 40/56   Time 8   Period Weeks   Status Revised     PT LONG TERM GOAL #2   Title Patient ambulates 500' including grass, paved surfaces, gravel, and inclines/declines without device modified independent. (Target Date: 05/19/2016) (New Target Date: 07/14/16)   Baseline Partially met: 05/22/16 - used SPC 300' and no AD 200' with min guard   Time 8   Period Weeks   Status On-going     PT LONG TERM GOAL #3   Title Patient negotiates ramps, curbs & stairs (1 rail) without device modified independent. (Target Date: 05/19/2016) NEW TARGET DATE 07/14/2016   Baseline 05/22/16 - used SPC and minA   Time 8   Period Weeks   Status On-going     PT  LONG TERM GOAL #4   Title Timed Up-Go <12 seconds without device independently. (Target Date: 05/19/2016)   Baseline 05/22/16 - 14.03 sec with SPC and min gurad   Time 8   Period Weeks   Status On-going     PT LONG TERM GOAL #5   Title Patient reports 50% improvement in dizziness with sit to/from stand, supine to/from sit and rolling in bed. (Target Date: 05/19/2016)   Baseline GOAL MET: 05/22/16   Time 8   Period Weeks   Status Achieved     Additional Long Term Goals   Additional Long Term Goals Yes     PT LONG TERM GOAL #6   Title Patient verbalizes application of energy conservation & exercising with MS / independent in HEP - ongoing fitness plan. (Target Date: 05/19/2016)  NEW TARGET DATE 07/14/2016   Baseline GOAL MET: 05/22/16 at verbalization level set at eval.  Revised to verbalizes application    Time 8   Period Weeks   Status Achieved     PT LONG TERM GOAL #7   Title Functional Gait Assessment > 14/30 to indicate lower fall risk. (Target Date: 07/14/2016)   Time 8   Period Weeks   Status New     PT LONG TERM GOAL #8   Title Timed Up-Go without device <13sec safely to indicate lower fall risk.  TARGET DATE 07/14/2016   Time 8   Period Weeks   Status New               Plan - 06/15/16 1616    Clinical Impression Statement Current session was the first patient has attended since 10/16 due to pt having forgotten about other PT sessions. Pt met 6/7 STG's, suggesting improved standing balance, increased efficiency of mobility, and decreased fall risk. Pt did not meet STG addressing mod I for community mobility using  SPC. Independence with ambulation is limited by gait instability with turning head/body. Home exercise provided to address this. PT also reiterated importance of consistent compliance to meet pt's functional goals. Pt verbalized understanding and was in agreement.    Rehab Potential Good   Clinical Impairments Affecting Rehab Potential Fear, first  exacerbation of MS   PT Frequency 1x / week   PT Duration 8 weeks   PT Treatment/Interventions ADLs/Self Care Home Management;DME Instruction;Gait training;Stair training;Functional mobility training;Therapeutic activities;Therapeutic exercise;Balance training;Neuromuscular re-education;Patient/family education;Vestibular;Canalith Repostioning;Manual techniques;Energy conservation   PT Next Visit Plan Standing balance, community obstacle negotiation with SPC, habituation to head turns in seated, standing. Progress current HEP.   PT Home Exercise Plan Bridging, sit to stands; standing horizontal, vertical head turns for habituation   Consulted and Agree with Plan of Care Patient      Patient will benefit from skilled therapeutic intervention in order to improve the following deficits and impairments:  Abnormal gait, Decreased activity tolerance, Decreased balance, Decreased knowledge of use of DME, Decreased mobility, Dizziness  Visit Diagnosis: Unsteadiness on feet  Other abnormalities of gait and mobility     Problem List Patient Active Problem List   Diagnosis Date Noted  . Visual disturbance 02/29/2016  . Memory disorder 02/29/2016  . Abnormality of gait 11/25/2014  . Multiple sclerosis ([place]) 11/21/2013  . Neurogenic bladder 11/21/2013  . Headache, tension type, chronic 11/17/2011  . Hot flashes 11/17/2011  . Essential hypertension, benign 09/20/2011  . Other screening mammogram 08/18/2011  . Routine general medical examination at a health care facility 08/18/2011    Billie Ruddy, PT, DPT [facility] [address] [place], [place], [address] Phone: [phone]   Fax:  [phone] 06/15/16, 4:23 PM  Name: Chauncey Bruno MRN: [id number] Date of Birth: [birth year]

## 2016-06-15 NOTE — Patient Instructions (Addendum)
Feet Apart, Head Motion - Eyes Open    Wear your new glasses for this exercise. Stand with your back to a corner with a stable chair in front of you. With eyes open, feet apart, move head slowly: up and down 10 times; right to left 10 times. Repeat __2__ times per session. Do __1-2__ sessions per day.  Copyright  VHI. All rights reserved.

## 2016-06-19 ENCOUNTER — Ambulatory Visit: Payer: Medicare Other | Admitting: Physical Therapy

## 2016-06-20 ENCOUNTER — Other Ambulatory Visit: Payer: Self-pay

## 2016-06-20 MED ORDER — SOLIFENACIN SUCCINATE 10 MG PO TABS: 10.0000 mg | ORAL_TABLET | Freq: Every day | ORAL | 3 refills | Status: DC

## 2016-06-20 NOTE — Telephone Encounter (Signed)
[facility] for 10 mg, only had 5 mg rx. Re-sent rx for 10 mg.

## 2016-06-21 ENCOUNTER — Ambulatory Visit: Payer: Medicare Other | Admitting: Physical Therapy

## 2016-06-21 ENCOUNTER — Encounter: Payer: Self-pay | Admitting: Physical Therapy

## 2016-06-21 DIAGNOSIS — R2681 Unsteadiness on feet: Secondary | ICD-10-CM

## 2016-06-21 DIAGNOSIS — R42 Dizziness and giddiness: Secondary | ICD-10-CM

## 2016-06-21 DIAGNOSIS — R2689 Other abnormalities of gait and mobility: Secondary | ICD-10-CM

## 2016-06-22 NOTE — Therapy (Signed)
[facility] [address] [place], [place], [address] Phone: [phone]   Fax:  [phone]  Physical Therapy Treatment  Patient Details  Name: Connie Robertson MRN: [id number] Date of Birth: 1971/07/18 Referring Provider: Elon Alas. Jannifer Franklin, MD  Encounter Date: 06/21/2016      PT End of Session - 06/21/16 1107    Visit Number 15   Number of Visits 20   Date for PT Re-Evaluation 07/14/16   Authorization Type [employer] Medicare G-Code & Progress report   PT Start Time 1104   PT Stop Time 1145   PT Time Calculation (min) 41 min   Equipment Utilized During Treatment Gait belt   Activity Tolerance Patient tolerated treatment well   Behavior During Therapy WFL for tasks assessed/performed      Past Medical History:  Diagnosis Date  . Abnormality of gait 11/25/2014  . Chronic daily headache 2003   Love  . Depression   . Memory disorder 02/29/2016  . MS (multiple sclerosis) (HCC) 1992   Dr. Erling Cruz  . Multiple sclerosis ([place]) 11/21/2013  . Neurogenic bladder 11/21/2013    Past Surgical History:  Procedure Laterality Date  . ABLATION COLPOCLESIS  2004  . CESAREAN SECTION    . TUBAL LIGATION      There were no vitals filed for this visit.      Subjective Assessment - 06/21/16 1107    Subjective Pt arrived today with sneaker's and straight cane. Denies any falls or pain today.    Pertinent History HTN   Limitations Standing;Walking   Patient Stated Goals To get back to previous to exacerbation level including exercise class   Currently in Pain? No/denies   Pain Score 0-No pain            [facility] Adult PT Treatment/Exercise - 06/21/16 1134      High Level Balance   High Level Balance Activities Negotiating over obstacles   High Level Balance Comments forward stepping over 4 bolsters of varied heights with straight cane, cues on sequencing and            Balance Exercises - 06/21/16 1141      Balance  Exercises: Standing   Standing Eyes Opened Wide (BOA);Solid surface;Other reps (comment);Head turns;Limitations   Standing Eyes Closed Wide (BOA);Solid surface;Other reps (comment);Limitations   Gait with Head Turns Forward;Limitations     Balance Exercises: Seated   Other Seated Exercises seated X1 exercises: performed both head movements up<>down and left<>right, 30 sec's x 3 each way.             Balance Exercises: Standing   Standing Eyes Opened Limitations in corner with feet apart and chair in front: EO head movements up<>down, left<>right and diagonals both ways x 10 reps each way.    Standing Eyes Closed Limitations in corner with chair in front of him with wide base of support on solid surface: EC no head movements for 30 sec's x 3 reps   Tandem Gait Limitations gait along back hallway scanning for 14 playing cards in random high/low places on both left/right, cues to keep walking with head turns at normal pace, min guard assist for balance. pt missing 2-3 cards both gait trials.             PT Short Term Goals - 06/15/16 1327      PT SHORT TERM GOAL #1   Title Patient verbalizes understanding of energy conservation with activities with MS. (Target Date: 04/21/2016)  Baseline met on 04/17/16   Status Achieved     PT SHORT TERM GOAL #2   Title Patient ambulates 300' without AD with supervision. (Target Date: 04/21/2016) (New Target Date: 06/16/16)   Baseline Met 11/9.   Status Achieved     PT SHORT TERM GOAL #3   Title Patient negotiates ramps, curbs and stairs (1 rail) with single point cane with mod I. (Target Date: 04/21/2016) (New Target Date: 06/16/16)   Baseline 11/9: using SPC, pt requires (S) for stair and ramp negotiation, supervision to descend curb, min guard to ascend curb   Status Not Met     PT SHORT TERM GOAL #4   Title Patient reaches 10" without balance loss. (Target Date: 04/21/2016)   Baseline GOAL MET: 04/21/16   Status Achieved     PT SHORT TERM GOAL  #5   Title Patient reports 25% improvement in vertigo with sit to stand and supine to sit. (Target Date: 04/21/2016)   Baseline GOAL MET: 05/19/16 (LTG was 50% improvement)   Time 4   Period Weeks   Status Achieved     PT SHORT TERM GOAL #6   Title Pt will improve Berg Balance score to > or = 43/56 to indicate improved static balance. (Target Date: 06/16/16)   Baseline 11/9: Merrilee Jansky = 44/56   Time 4   Period Weeks   Status Achieved     PT SHORT TERM GOAL #7   Title Pt will improve TUG time to < 13.5 sec to indicate decr risk for falls. (Target Date: 06/16/16)   Baseline 11/9: TUG = 13.15 sec with SPC   Time 4   Period Weeks   Status Achieved           PT Long Term Goals - 05/22/16 1000      PT LONG TERM GOAL #1   Title Berg Balance >45/56 to indicate lower fall risk. (Target Date: 07/14/2016)   Baseline GOAL MET: 05/22/16 - 40/56   Time 8   Period Weeks   Status Revised     PT LONG TERM GOAL #2   Title Patient ambulates 500' including grass, paved surfaces, gravel, and inclines/declines without device modified independent. (Target Date: 05/19/2016) (New Target Date: 07/14/16)   Baseline Partially met: 05/22/16 - used SPC 300' and no AD 200' with min guard   Time 8   Period Weeks   Status On-going     PT LONG TERM GOAL #3   Title Patient negotiates ramps, curbs & stairs (1 rail) without device modified independent. (Target Date: 05/19/2016) NEW TARGET DATE 07/14/2016   Baseline 05/22/16 - used SPC and minA   Time 8   Period Weeks   Status On-going     PT LONG TERM GOAL #4   Title Timed Up-Go <12 seconds without device independently. (Target Date: 05/19/2016)   Baseline 05/22/16 - 14.03 sec with SPC and min gurad   Time 8   Period Weeks   Status On-going     PT LONG TERM GOAL #5   Title Patient reports 50% improvement in dizziness with sit to/from stand, supine to/from sit and rolling in bed. (Target Date: 05/19/2016)   Baseline GOAL MET: 05/22/16   Time 8    Period Weeks   Status Achieved     Additional Long Term Goals   Additional Long Term Goals Yes     PT LONG TERM GOAL #6   Title Patient verbalizes application of energy conservation & exercising with MS /  independent in HEP - ongoing fitness plan. (Target Date: 05/19/2016)  NEW TARGET DATE 07/14/2016   Baseline GOAL MET: 05/22/16 at verbalization level set at eval.  Revised to verbalizes application    Time 8   Period Weeks   Status Achieved     PT LONG TERM GOAL #7   Title Functional Gait Assessment > 14/30 to indicate lower fall risk. (Target Date: 07/14/2016)   Time 8   Period Weeks   Status New     PT LONG TERM GOAL #8   Title Timed Up-Go without device <13sec safely to indicate lower fall risk.  TARGET DATE 07/14/2016   Time 8   Period Weeks   Status New            Plan - 06/21/16 1108    Clinical Impression Statement Today's skilled session continued to focus on balance, both static and dynamic/with gait. Continues to be challenged with any head movements during balance activities. Pt is making steady progress toward goals and should benefit from continued PT to progress toward unmet goals.    Rehab Potential Good   Clinical Impairments Affecting Rehab Potential Fear, first exacerbation of MS   PT Frequency 1x / week   PT Duration 8 weeks   PT Treatment/Interventions ADLs/Self Care Home Management;DME Instruction;Gait training;Stair training;Functional mobility training;Therapeutic activities;Therapeutic exercise;Balance training;Neuromuscular re-education;Patient/family education;Vestibular;Canalith Repostioning;Manual techniques;Energy conservation   PT Next Visit Plan Standing balance, community obstacle negotiation with SPC, habituation to head turns in seated, standing. Progress current HEP.   PT Home Exercise Plan Bridging, sit to stands; standing horizontal, vertical head turns for habituation   Consulted and Agree with Plan of Care Patient      Patient will  benefit from skilled therapeutic intervention in order to improve the following deficits and impairments:  Abnormal gait, Decreased activity tolerance, Decreased balance, Decreased knowledge of use of DME, Decreased mobility, Dizziness  Visit Diagnosis: Unsteadiness on feet  Other abnormalities of gait and mobility  Dizziness and giddiness     Problem List Patient Active Problem List   Diagnosis Date Noted  . Visual disturbance 02/29/2016  . Memory disorder 02/29/2016  . Abnormality of gait 11/25/2014  . Multiple sclerosis ([place]) 11/21/2013  . Neurogenic bladder 11/21/2013  . Headache, tension type, chronic 11/17/2011  . Hot flashes 11/17/2011  . Essential hypertension, benign 09/20/2011  . Other screening mammogram 08/18/2011  . Routine general medical examination at a health care facility 08/18/2011    Willow Ora, PTA, [facility] [address], [place],  [address] [phone] 06/22/16, 9:21 AM   Name: Akshaya Toepfer MRN: [id number] Date of Birth: 04-14-1971

## 2016-06-26 ENCOUNTER — Ambulatory Visit: Payer: Medicare Other | Admitting: Physical Therapy

## 2016-06-26 ENCOUNTER — Encounter: Payer: Self-pay | Admitting: Physical Therapy

## 2016-06-26 DIAGNOSIS — R2689 Other abnormalities of gait and mobility: Secondary | ICD-10-CM

## 2016-06-26 DIAGNOSIS — R2681 Unsteadiness on feet: Secondary | ICD-10-CM | POA: Diagnosis not present

## 2016-06-26 DIAGNOSIS — R42 Dizziness and giddiness: Secondary | ICD-10-CM

## 2016-06-26 DIAGNOSIS — R269 Unspecified abnormalities of gait and mobility: Secondary | ICD-10-CM

## 2016-06-26 NOTE — Patient Instructions (Addendum)
Chair Sitting    Sit at edge of seat, spine straight, one leg extended. Put a hand on each thigh and bend forward from the hip, keeping spine straight. Allow hand on extended leg to reach toward toes. Support upper body with other arm. Hold ___ seconds. Repeat ___ times per session. Do ___ sessions per day.  Copyright  VHI. All rights reserved.  Gastroc / Heel Cord Stretch - Seated With Towel    Sit on floor, towel around ball of foot. Gently pull foot in toward body, stretching heel cord and calf. Hold for ___ seconds. Repeat on involved leg. Repeat ___ times. Do ___ times per day.  Copyright  VHI. All rights reserved.  TRUNK: Rotation    Sit with upright posture. Rotate body until gentle resistance is felt. Hold ___ seconds. ___ reps per set, ___ sets per day, ___ days per week Repeat to other side.  Copyright  VHI. All rights reserved.  Rotation    Stand with back to wall, feet ____ inches from wall. Turn to left and place palms on wall about shoulder height. Gently lean into wall with hip. Hold ____ seconds. Repeat ____ times. Do ____ sessions per day.  Copyright  VHI. All rights reserved.

## 2016-06-27 NOTE — Therapy (Signed)
[facility] [address] [place], [place], [address] Phone: [phone]   Fax:  [phone]  Physical Therapy Treatment  Patient Details  Name: Connie Robertson MRN: [id number] Date of Birth: March 25, 1971 Referring Provider: Elon Alas. Jannifer Franklin, MD  Encounter Date: 06/26/2016      PT End of Session - 06/26/16 1457    Visit Number 16   Number of Visits 20   Date for PT Re-Evaluation 07/14/16   Authorization Type [employer] Medicare G-Code & Progress report   PT Start Time 1158   PT Stop Time 1230   PT Time Calculation (min) 32 min   Equipment Utilized During Treatment Gait belt   Activity Tolerance Patient tolerated treatment well   Behavior During Therapy WFL for tasks assessed/performed      Past Medical History:  Diagnosis Date  . Abnormality of gait 11/25/2014  . Chronic daily headache 2003   Love  . Depression   . Memory disorder 02/29/2016  . MS (multiple sclerosis) (HCC) 1992   Dr. Erling Cruz  . Multiple sclerosis ([place]) 11/21/2013  . Neurogenic bladder 11/21/2013    Past Surgical History:  Procedure Laterality Date  . ABLATION COLPOCLESIS  2004  . CESAREAN SECTION    . TUBAL LIGATION      There were no vitals filed for this visit.      Subjective Assessment - 06/26/16 1201    Subjective No falls. She has been doing walking with cane & without cane. If she walks without cane, the next day her legs feel tired.    Pertinent History HTN   Limitations Standing;Walking   Patient Stated Goals To get back to previous to exacerbation level including exercise class   Currently in Pain? No/denies     Therapeutic Exercise:  PT instructed with demo, verbal cues and handout in LE stretching program with pt return demo understanding.  Standing balance: modified single leg stance with one foot in lower cabinet and tandem stance near counter for intermittent UE support.  Self-Care: PT instructed in energy conservation  with MS. Pt verbalized a general understanding.                             PT Education - 06/26/16 1200    Education provided Yes   Education Details MS energy conservation techniques, avoid heat and overactivity including use of assistive device   Person(s) Educated Patient   Methods Explanation;Verbal cues   Comprehension Verbalized understanding;Verbal cues required;Need further instruction          PT Short Term Goals - 06/26/16 1454      PT SHORT TERM GOAL #1   Title Patient verbalizes understanding of energy conservation with activities with MS. (Target Date: 04/21/2016)   Baseline met on 04/17/16   Status Achieved     PT SHORT TERM GOAL #2   Title Patient ambulates 300' without AD with supervision. (Target Date: 04/21/2016) (New Target Date: 06/16/16)   Baseline Met 11/9.   Status Achieved     PT SHORT TERM GOAL #3   Title Patient negotiates ramps, curbs and stairs (1 rail) with single point cane with mod I. (Target Date: 04/21/2016) (New Target Date: 06/16/16)   Baseline 11/9: using SPC, pt requires (S) for stair and ramp negotiation, supervision to descend curb, min guard to ascend curb   Status Not Met     PT SHORT TERM GOAL #4   Title Patient reaches 10"  without balance loss. (Target Date: 04/21/2016)   Baseline GOAL MET: 04/21/16   Status Achieved     PT SHORT TERM GOAL #5   Title Patient reports 25% improvement in vertigo with sit to stand and supine to sit. (Target Date: 04/21/2016)   Baseline GOAL MET: 05/19/16 (LTG was 50% improvement)   Time 4   Period Weeks   Status Achieved     PT SHORT TERM GOAL #6   Title Pt will improve Berg Balance score to > or = 43/56 to indicate improved static balance. (Target Date: 06/16/16)   Baseline 11/9: Merrilee Jansky = 44/56   Time 4   Period Weeks   Status Achieved     PT SHORT TERM GOAL #7   Title Pt will improve TUG time to < 13.5 sec to indicate decr risk for falls. (Target Date: 06/16/16)   Baseline  11/9: TUG = 13.15 sec with SPC   Time 4   Period Weeks   Status Achieved           PT Long Term Goals - 06/26/16 1454      PT LONG TERM GOAL #1   Title Berg Balance >45/56 to indicate lower fall risk. (Target Date: 07/14/2016)   Baseline GOAL MET: 05/22/16 - 40/56   Time 8   Period Weeks   Status On-going     PT LONG TERM GOAL #2   Title Patient ambulates 500' including grass, paved surfaces, gravel, and inclines/declines without device modified independent. (Target Date: 05/19/2016) (New Target Date: 07/14/16)   Baseline Partially met: 05/22/16 - used SPC 300' and no AD 200' with min guard   Time 8   Period Weeks   Status On-going     PT LONG TERM GOAL #3   Title Patient negotiates ramps, curbs & stairs (1 rail) without device modified independent. (Target Date: 05/19/2016) NEW TARGET DATE 07/14/2016   Baseline 05/22/16 - used SPC and minA   Time 8   Period Weeks   Status On-going     PT LONG TERM GOAL #4   Title Timed Up-Go <12 seconds without device independently. (Target Date: 05/19/2016)   Baseline 05/22/16 - 14.03 sec with SPC and min gurad   Time 8   Period Weeks   Status On-going     PT LONG TERM GOAL #5   Title Patient reports 50% improvement in dizziness with sit to/from stand, supine to/from sit and rolling in bed. (Target Date: 05/19/2016)   Baseline GOAL MET: 05/22/16   Time 8   Period Weeks   Status Achieved     PT LONG TERM GOAL #6   Title Patient verbalizes application of energy conservation & exercising with MS / independent in HEP - ongoing fitness plan. (Target Date: 05/19/2016)  NEW TARGET DATE 07/14/2016   Baseline GOAL MET: 05/22/16 at verbalization level set at eval.  Revised to verbalizes application    Time 8   Period Weeks   Status Achieved     PT LONG TERM GOAL #7   Title Functional Gait Assessment > 14/30 to indicate lower fall risk. (Target Date: 07/14/2016)   Time 8   Period Weeks   Status On-going     PT LONG TERM GOAL #8    Title Timed Up-Go without device <13sec safely to indicate lower fall risk.  TARGET DATE 07/14/2016   Time 8   Period Weeks   Status On-going  Plan - 06/26/16 1455    Clinical Impression Statement Patient verbalizes understanding of energy conservation techniques with MS then later discussion, she made reference to exercising in manner that would be over exercising / exertion.    Rehab Potential Good   Clinical Impairments Affecting Rehab Potential Fear, first exacerbation of MS   PT Frequency 1x / week   PT Duration 8 weeks   PT Treatment/Interventions ADLs/Self Care Home Management;DME Instruction;Gait training;Stair training;Functional mobility training;Therapeutic activities;Therapeutic exercise;Balance training;Neuromuscular re-education;Patient/family education;Vestibular;Canalith Repostioning;Manual techniques;Energy conservation   PT Next Visit Plan Standing balance, community obstacle negotiation with SPC, habituation to head turns in seated, standing. Progress current HEP.   PT Home Exercise Plan Bridging, sit to stands; standing horizontal, vertical head turns for habituation   Consulted and Agree with Plan of Care Patient      Patient will benefit from skilled therapeutic intervention in order to improve the following deficits and impairments:  Abnormal gait, Decreased activity tolerance, Decreased balance, Decreased knowledge of use of DME, Decreased mobility, Dizziness  Visit Diagnosis: Unsteadiness on feet  Other abnormalities of gait and mobility  Dizziness and giddiness  Abnormality of gait     Problem List Patient Active Problem List   Diagnosis Date Noted  . Visual disturbance 02/29/2016  . Memory disorder 02/29/2016  . Abnormality of gait 11/25/2014  . Multiple sclerosis ([place]) 11/21/2013  . Neurogenic bladder 11/21/2013  . Headache, tension type, chronic 11/17/2011  . Hot flashes 11/17/2011  . Essential hypertension, benign 09/20/2011   . Other screening mammogram 08/18/2011  . Routine general medical examination at a health care facility 08/18/2011    [facility] PT, DPT 06/27/2016, 2:58 PM  [facility] [address] [place], [place], [address] Phone: [phone]   Fax:  [phone]  Name: Connie Robertson MRN: [id number] Date of Birth: April 02, 1971

## 2016-06-28 ENCOUNTER — Ambulatory Visit: Payer: Medicare Other | Admitting: Physical Therapy

## 2016-07-03 ENCOUNTER — Ambulatory Visit: Payer: Medicare Other | Admitting: Physical Therapy

## 2016-07-05 ENCOUNTER — Ambulatory Visit: Payer: Medicare Other | Admitting: Physical Therapy

## 2016-07-05 ENCOUNTER — Encounter: Payer: Self-pay | Admitting: Physical Therapy

## 2016-07-05 DIAGNOSIS — R2689 Other abnormalities of gait and mobility: Secondary | ICD-10-CM

## 2016-07-05 DIAGNOSIS — R2681 Unsteadiness on feet: Secondary | ICD-10-CM

## 2016-07-05 NOTE — Therapy (Signed)
[facility] [address] [place], [place], [address] Phone: [phone]   Fax:  [phone]  Physical Therapy Treatment  Patient Details  Name: Connie Robertson MRN: [id number] Date of Birth: 17-Oct-1970 Referring Provider: Elon Alas. Jannifer Franklin, MD  Encounter Date: 07/05/2016      PT End of Session - 07/05/16 2148    Visit Number 17   Number of Visits 20   Date for PT Re-Evaluation 07/14/16   Authorization Type [employer] Medicare G-Code & Progress report   PT Start Time 0930   PT Stop Time 1015   PT Time Calculation (min) 45 min   Equipment Utilized During Treatment Gait belt   Activity Tolerance Patient tolerated treatment well   Behavior During Therapy WFL for tasks assessed/performed      Past Medical History:  Diagnosis Date  . Abnormality of gait 11/25/2014  . Chronic daily headache 2003   Love  . Depression   . Memory disorder 02/29/2016  . MS (multiple sclerosis) (HCC) 1992   Dr. Erling Cruz  . Multiple sclerosis ([place]) 11/21/2013  . Neurogenic bladder 11/21/2013    Past Surgical History:  Procedure Laterality Date  . ABLATION COLPOCLESIS  2004  . CESAREAN SECTION    . TUBAL LIGATION      There were no vitals filed for this visit.      Subjective Assessment - 07/05/16 0939    Subjective No falls. She has been trying to manage energy level during the day as PT advised.      Pertinent History HTN   Limitations Standing;Walking   Patient Stated Goals To get back to previous to exacerbation level including exercise class   Currently in Pain? No/denies     Therapeutic Exercise: See pt education: HEP: single knee to chest, double knee to chest, trunk rotation. Thomas Hip flexor stretch supine & sidelying hip extension passively.  PT instructed in cardio  Equipment with lower intensity to avoid fatigue with MS. PT recommended seated recumbent machine that uses both UEs & LEs at same time. Pt performed 5 min on  NuStep at ~60spm while PT instructed pt in set-up and intensity issues. Pt continued to question when she will be able to return to spin class at [facility] with PT instructing in concerns with intensity.   Therapeutic Activities: PT demo proper technique for supine to sit via sidelying to decrease stress on back. Pt return demo & verbalized understanding. PT demo, instructed in positioning in sidelying using pillows. Pt verbalized understanding.                            PT Education - 07/05/16 0930    Education provided Yes   Education Details stretching HEP for hip & back, bed mobility technique,    Person(s) Educated Patient   Methods Explanation;Demonstration;Verbal cues;Handout   Comprehension Verbalized understanding;Returned demonstration;Need further instruction          PT Short Term Goals - 06/26/16 1454      PT SHORT TERM GOAL #1   Title Patient verbalizes understanding of energy conservation with activities with MS. (Target Date: 04/21/2016)   Baseline met on 04/17/16   Status Achieved     PT SHORT TERM GOAL #2   Title Patient ambulates 300' without AD with supervision. (Target Date: 04/21/2016) (New Target Date: 06/16/16)   Baseline Met 11/9.   Status Achieved     PT SHORT TERM GOAL #3  Title Patient negotiates ramps, curbs and stairs (1 rail) with single point cane with mod I. (Target Date: 04/21/2016) (New Target Date: 06/16/16)   Baseline 11/9: using SPC, pt requires (S) for stair and ramp negotiation, supervision to descend curb, min guard to ascend curb   Status Not Met     PT SHORT TERM GOAL #4   Title Patient reaches 10" without balance loss. (Target Date: 04/21/2016)   Baseline GOAL MET: 04/21/16   Status Achieved     PT SHORT TERM GOAL #5   Title Patient reports 25% improvement in vertigo with sit to stand and supine to sit. (Target Date: 04/21/2016)   Baseline GOAL MET: 05/19/16 (LTG was 50% improvement)   Time 4   Period Weeks    Status Achieved     PT SHORT TERM GOAL #6   Title Pt will improve Berg Balance score to > or = 43/56 to indicate improved static balance. (Target Date: 06/16/16)   Baseline 11/9: Merrilee Jansky = 44/56   Time 4   Period Weeks   Status Achieved     PT SHORT TERM GOAL #7   Title Pt will improve TUG time to < 13.5 sec to indicate decr risk for falls. (Target Date: 06/16/16)   Baseline 11/9: TUG = 13.15 sec with SPC   Time 4   Period Weeks   Status Achieved           PT Long Term Goals - 06/26/16 1454      PT LONG TERM GOAL #1   Title Berg Balance >45/56 to indicate lower fall risk. (Target Date: 07/14/2016)   Baseline GOAL MET: 05/22/16 - 40/56   Time 8   Period Weeks   Status On-going     PT LONG TERM GOAL #2   Title Patient ambulates 500' including grass, paved surfaces, gravel, and inclines/declines without device modified independent. (Target Date: 05/19/2016) (New Target Date: 07/14/16)   Baseline Partially met: 05/22/16 - used SPC 300' and no AD 200' with min guard   Time 8   Period Weeks   Status On-going     PT LONG TERM GOAL #3   Title Patient negotiates ramps, curbs & stairs (1 rail) without device modified independent. (Target Date: 05/19/2016) NEW TARGET DATE 07/14/2016   Baseline 05/22/16 - used SPC and minA   Time 8   Period Weeks   Status On-going     PT LONG TERM GOAL #4   Title Timed Up-Go <12 seconds without device independently. (Target Date: 05/19/2016)   Baseline 05/22/16 - 14.03 sec with SPC and min gurad   Time 8   Period Weeks   Status On-going     PT LONG TERM GOAL #5   Title Patient reports 50% improvement in dizziness with sit to/from stand, supine to/from sit and rolling in bed. (Target Date: 05/19/2016)   Baseline GOAL MET: 05/22/16   Time 8   Period Weeks   Status Achieved     PT LONG TERM GOAL #6   Title Patient verbalizes application of energy conservation & exercising with MS / independent in HEP - ongoing fitness plan. (Target Date:  05/19/2016)  NEW TARGET DATE 07/14/2016   Baseline GOAL MET: 05/22/16 at verbalization level set at eval.  Revised to verbalizes application    Time 8   Period Weeks   Status Achieved     PT LONG TERM GOAL #7   Title Functional Gait Assessment > 14/30 to indicate lower fall risk. (Target Date:  07/14/2016)   Time 8   Period Weeks   Status On-going     PT LONG TERM GOAL #8   Title Timed Up-Go without device <13sec safely to indicate lower fall risk.  TARGET DATE 07/14/2016   Time 8   Period Weeks   Status On-going               Plan - 07/05/16 2149    Clinical Impression Statement Patient appears to understand stretches recommended in supine at bed time & in am. Patient continued to ask when she would be able to return to "spin class" even with instructions in MS concerns with limiting exercise intensity.    Rehab Potential Good   Clinical Impairments Affecting Rehab Potential Fear, first exacerbation of MS   PT Frequency 1x / week   PT Duration 8 weeks   PT Treatment/Interventions ADLs/Self Care Home Management;DME Instruction;Gait training;Stair training;Functional mobility training;Therapeutic activities;Therapeutic exercise;Balance training;Neuromuscular re-education;Patient/family education;Vestibular;Canalith Repostioning;Manual techniques;Energy conservation   PT Next Visit Plan Standing balance, community obstacle negotiation with SPC, habituation to head turns in seated, standing. Progress current HEP.   PT Home Exercise Plan Bridging, sit to stands; standing horizontal, vertical head turns for habituation   Consulted and Agree with Plan of Care Patient      Patient will benefit from skilled therapeutic intervention in order to improve the following deficits and impairments:  Abnormal gait, Decreased activity tolerance, Decreased balance, Decreased knowledge of use of DME, Decreased mobility, Dizziness  Visit Diagnosis: Unsteadiness on feet  Other abnormalities of  gait and mobility     Problem List Patient Active Problem List   Diagnosis Date Noted  . Visual disturbance 02/29/2016  . Memory disorder 02/29/2016  . Abnormality of gait 11/25/2014  . Multiple sclerosis ([place]) 11/21/2013  . Neurogenic bladder 11/21/2013  . Headache, tension type, chronic 11/17/2011  . Hot flashes 11/17/2011  . Essential hypertension, benign 09/20/2011  . Other screening mammogram 08/18/2011  . Routine general medical examination at a health care facility 08/18/2011    [facility] PT, DPT 07/05/2016, 9:54 PM  [facility] [address] [place], [place], [address] Phone: [phone]   Fax:  [phone]  Name: Connie Robertson MRN: [id number] Date of Birth: 12-31-1970

## 2016-07-05 NOTE — Patient Instructions (Signed)
Pelvic Tilt    Flatten back by tightening stomach muscles and buttocks. Repeat 5-10 times per set.http://orth.exer.us/134   Copyright  VHI. All rights reserved.  Knee to Chest (Flexion)    Pull knee toward chest. Feel stretch in lower back or buttock area. Breathing deeply, Hold 60 seconds. Repeat with other knee. Repeat 1 times on each leg.  http://gt2.exer.us/225   Copyright  VHI. All rights reserved.  Double Knee to Chest (Flexion)    Using your hands, grab one knee then the other, pulling both knees up toward your chest gently. Feel stretch in lower back or buttock area. Breathing deeply, Hold 60 seconds and return one leg at a time back to resting position. Repeat 1 imes.   http://gt2.exer.us/227   Copyright  VHI. All rights reserved.  Lumbar Rotation: Caudal - Bilateral (Supine)    Feet and knees together, arms outstretched, rotate knees left, turning head in opposite direction, until stretch is felt. Hold 60 seconds. Relax. Repeat 1 times per set.   http://orth.exer.us/1020   Copyright  VHI. All rights reserved.

## 2016-07-10 ENCOUNTER — Ambulatory Visit: Payer: Medicare Other | Attending: Neurology | Admitting: Rehabilitation

## 2016-07-10 ENCOUNTER — Encounter: Payer: Self-pay | Admitting: Rehabilitation

## 2016-07-10 DIAGNOSIS — R2689 Other abnormalities of gait and mobility: Secondary | ICD-10-CM | POA: Diagnosis present

## 2016-07-10 DIAGNOSIS — R2681 Unsteadiness on feet: Secondary | ICD-10-CM | POA: Insufficient documentation

## 2016-07-10 DIAGNOSIS — G35 Multiple sclerosis: Secondary | ICD-10-CM | POA: Insufficient documentation

## 2016-07-10 DIAGNOSIS — R269 Unspecified abnormalities of gait and mobility: Secondary | ICD-10-CM | POA: Diagnosis present

## 2016-07-10 NOTE — Therapy (Signed)
[facility] [address] [place], [place], [address] Phone: [phone]   Fax:  [phone]  Physical Therapy Treatment  Patient Details  Name: Connie Robertson MRN: [id number] Date of Birth: 1970-10-13 Referring Provider: Elon Alas. Jannifer Franklin, MD  Encounter Date: 07/10/2016      PT End of Session - 07/10/16 1108    Visit Number 18   Number of Visits 20   Date for PT Re-Evaluation 07/14/16   Authorization Type [employer] Medicare G-Code & Progress report   PT Start Time 1103   PT Stop Time 1147   PT Time Calculation (min) 44 min   Equipment Utilized During Treatment Gait belt   Activity Tolerance Patient tolerated treatment well   Behavior During Therapy WFL for tasks assessed/performed      Past Medical History:  Diagnosis Date  . Abnormality of gait 11/25/2014  . Chronic daily headache 2003   Love  . Depression   . Memory disorder 02/29/2016  . MS (multiple sclerosis) (HCC) 1992   Dr. Erling Cruz  . Multiple sclerosis ([place]) 11/21/2013  . Neurogenic bladder 11/21/2013    Past Surgical History:  Procedure Laterality Date  . ABLATION COLPOCLESIS  2004  . CESAREAN SECTION    . TUBAL LIGATION      There were no vitals filed for this visit.      Subjective Assessment - 07/10/16 1659    Subjective "I really want to go back to the gym."    Pertinent History HTN   Limitations Standing;Walking   Patient Stated Goals To get back to previous to exacerbation level including exercise class   Currently in Pain? No/denies            [facility] PT Assessment - 07/10/16 1124      Functional Gait  Assessment   Gait assessed  Yes   Gait Level Surface Walks 20 ft in less than 7 sec but greater than 5.5 sec, uses assistive device, slower speed, mild gait deviations, or deviates 6-10 in outside of the 12 in walkway width.   Change in Gait Speed Able to change speed, demonstrates mild gait deviations, deviates 6-10 in outside of the  12 in walkway width, or no gait deviations, unable to achieve a major change in velocity, or uses a change in velocity, or uses an assistive device.   Gait with Horizontal Head Turns Performs head turns smoothly with slight change in gait velocity (eg, minor disruption to smooth gait path), deviates 6-10 in outside 12 in walkway width, or uses an assistive device.   Gait with Vertical Head Turns Performs task with moderate change in gait velocity, slows down, deviates 10-15 in outside 12 in walkway width but recovers, can continue to walk.   Gait and Pivot Turn Pivot turns safely in greater than 3 sec and stops with no loss of balance, or pivot turns safely within 3 sec and stops with mild imbalance, requires small steps to catch balance.   Gait with Narrow Base of Support Ambulates less than 4 steps heel to toe or cannot perform without assistance.   Gait with Eyes Closed Walks 20 ft, slow speed, abnormal gait pattern, evidence for imbalance, deviates 10-15 in outside 12 in walkway width. Requires more than 9 sec to ambulate 20 ft.   Ambulating Backwards Walks 20 ft, uses assistive device, slower speed, mild gait deviations, deviates 6-10 in outside 12 in walkway width.   Steps Two feet to a stair, must use rail.  Did not finish FGA due to time constraint                [facility] Adult PT Treatment/Exercise - 07/10/16 1124      Transfers   Transfers Sit to Stand;Stand to Sit   Sit to Stand 7: Independent   Stand to Sit 7: Independent     Ambulation/Gait   Ambulation/Gait Yes   Ambulation/Gait Assistance 6: Modified independent (Device/Increase time)  with or without cane over indoor surfaces   Ambulation/Gait Assistance Details Note mild instability with head movements, however pt able to self correct balance without support.     Ambulation Distance (Feet) 400 Feet   Assistive device Straight cane;None   Gait Pattern Step-through pattern;Decreased stride length;Trunk flexed;Wide  base of support;Decreased arm swing - right;Decreased arm swing - left   Ambulation Surface Level;Indoor   Stairs Yes   Stairs Assistance 6: Modified independent (Device/Increase time)   Stair Management Technique One rail Right;Step to pattern;Forwards   Number of Stairs 8   Height of Stairs 6   Ramp 5: Supervision   Curb 5: Supervision   Curb Details (indicate cue type and reason) Cues for sequencing     Standardized Balance Assessment   Standardized Balance Assessment Berg Balance Test     Berg Balance Test   Sit to Stand Able to stand without using hands and stabilize independently   Standing Unsupported Able to stand safely 2 minutes   Sitting with Back Unsupported but Feet Supported on Floor or Stool Able to sit safely and securely 2 minutes   Stand to Sit Sits safely with minimal use of hands   Transfers Able to transfer safely, minor use of hands   Standing Unsupported with Eyes Closed Able to stand 10 seconds with supervision   Standing Ubsupported with Feet Together Able to place feet together independently and stand 1 minute safely   From Standing, Reach Forward with Outstretched Arm Can reach confidently >25 cm (10")   From Standing Position, Pick up Object from Floor Able to pick up shoe safely and easily   From Standing Position, Turn to Look Behind Over each Shoulder Looks behind from both sides and weight shifts well   Turn 360 Degrees Able to turn 360 degrees safely but slowly   Standing Unsupported, Alternately Place Feet on Step/Stool Able to stand independently and complete 8 steps >20 seconds   Standing Unsupported, One Foot in Front Able to plae foot ahead of the other independently and hold 30 seconds   Standing on One Leg Able to lift leg independently and hold 5-10 seconds   Total Score 50                PT Education - 07/10/16 1707    Education provided Yes   Education Details returning to gym slowly-beginning with treadmill as she can self  regulate this and do short bursts of activity   Person(s) Educated Patient   Methods Explanation   Comprehension Verbalized understanding          PT Short Term Goals - 06/26/16 1454      PT SHORT TERM GOAL #1   Title Patient verbalizes understanding of energy conservation with activities with MS. (Target Date: 04/21/2016)   Baseline met on 04/17/16   Status Achieved     PT SHORT TERM GOAL #2   Title Patient ambulates 300' without AD with supervision. (Target Date: 04/21/2016) (New Target Date: 06/16/16)   Baseline Met 11/9.   Status  Achieved     PT SHORT TERM GOAL #3   Title Patient negotiates ramps, curbs and stairs (1 rail) with single point cane with mod I. (Target Date: 04/21/2016) (New Target Date: 06/16/16)   Baseline 11/9: using SPC, pt requires (S) for stair and ramp negotiation, supervision to descend curb, min guard to ascend curb   Status Not Met     PT SHORT TERM GOAL #4   Title Patient reaches 10" without balance loss. (Target Date: 04/21/2016)   Baseline GOAL MET: 04/21/16   Status Achieved     PT SHORT TERM GOAL #5   Title Patient reports 25% improvement in vertigo with sit to stand and supine to sit. (Target Date: 04/21/2016)   Baseline GOAL MET: 05/19/16 (LTG was 50% improvement)   Time 4   Period Weeks   Status Achieved     PT SHORT TERM GOAL #6   Title Pt will improve Berg Balance score to > or = 43/56 to indicate improved static balance. (Target Date: 06/16/16)   Baseline 11/9: Merrilee Jansky = 44/56   Time 4   Period Weeks   Status Achieved     PT SHORT TERM GOAL #7   Title Pt will improve TUG time to < 13.5 sec to indicate decr risk for falls. (Target Date: 06/16/16)   Baseline 11/9: TUG = 13.15 sec with SPC   Time 4   Period Weeks   Status Achieved           PT Long Term Goals - 07/10/16 1115      PT LONG TERM GOAL #1   Title Berg Balance >45/56 to indicate lower fall risk. (Target Date: 07/14/2016)   Baseline 50/56 on 07/10/16   Time 8   Period  Weeks   Status Achieved     PT LONG TERM GOAL #2   Title Patient ambulates 500' including grass, paved surfaces, gravel, and inclines/declines without device modified independent. (Target Date: 05/19/2016) (New Target Date: 07/14/16)   Baseline Unable to assess due to time   Time 8   Period Weeks   Status Unable to assess     PT LONG TERM GOAL #3   Title Patient negotiates ramps, curbs & stairs (1 rail) without device modified independent. (Target Date: 05/19/2016) NEW TARGET DATE 07/14/2016   Baseline mod I on 07/10/16   Time 8   Period Weeks   Status Achieved     PT LONG TERM GOAL #4   Title Timed Up-Go <12 seconds without device independently. (Target Date: 05/19/2016)   Baseline 9.06 secs without AD 07/10/16   Time 8   Period Weeks   Status Achieved     PT LONG TERM GOAL #5   Title Patient reports 50% improvement in dizziness with sit to/from stand, supine to/from sit and rolling in bed. (Target Date: 05/19/2016)   Baseline GOAL MET: 05/22/16   Time 8   Period Weeks   Status Achieved     PT LONG TERM GOAL #6   Title Patient verbalizes application of energy conservation & exercising with MS / independent in HEP - ongoing fitness plan. (Target Date: 05/19/2016)  NEW TARGET DATE 07/14/2016   Baseline GOAL MET: 05/22/16 at verbalization level set at eval.  Revised to verbalizes application    Time 8   Period Weeks   Status Achieved     PT LONG TERM GOAL #7   Title Functional Gait Assessment > 14/30 to indicate lower fall risk. (Target Date: 07/14/2016)  Baseline only got through partial test   Time 8   Period Weeks   Status Unable to assess     PT LONG TERM GOAL #8   Title Timed Up-Go without device <13sec safely to indicate lower fall risk.  TARGET DATE 07/14/2016   Baseline 9.06 secs on 07/10/16 without AD   Time 8   Period Weeks   Status Achieved               Plan - 07/10/16 1114    Clinical Impression Statement Skilled session began to look at [place].  Note  that she has met 6/8 goals assessed today and making good progress towards remaining LTGs.  Added single visit next week for primary PT to complete D/C vs renewal.     Rehab Potential Good   Clinical Impairments Affecting Rehab Potential Fear, first exacerbation of MS   PT Frequency 1x / week   PT Duration 8 weeks   PT Treatment/Interventions ADLs/Self Care Home Management;DME Instruction;Gait training;Stair training;Functional mobility training;Therapeutic activities;Therapeutic exercise;Balance training;Neuromuscular re-education;Patient/family education;Vestibular;Canalith Repostioning;Manual techniques;Energy conservation   PT Next Visit Plan renewal and D/C or renewal only???   PT Home Exercise Plan Bridging, sit to stands; standing horizontal, vertical head turns for habituation   Consulted and Agree with Plan of Care Patient      Patient will benefit from skilled therapeutic intervention in order to improve the following deficits and impairments:  Abnormal gait, Decreased activity tolerance, Decreased balance, Decreased knowledge of use of DME, Decreased mobility, Dizziness  Visit Diagnosis: Unsteadiness on feet  Other abnormalities of gait and mobility  Abnormality of gait  Multiple sclerosis [facility])     Problem List Patient Active Problem List   Diagnosis Date Noted  . Visual disturbance 02/29/2016  . Memory disorder 02/29/2016  . Abnormality of gait 11/25/2014  . Multiple sclerosis ([place]) 11/21/2013  . Neurogenic bladder 11/21/2013  . Headache, tension type, chronic 11/17/2011  . Hot flashes 11/17/2011  . Essential hypertension, benign 09/20/2011  . Other screening mammogram 08/18/2011  . Routine general medical examination at a health care facility 08/18/2011   Cameron Sprang, PT, MPT [facility] [address] [place], [place], [address] Phone: [phone]   Fax:  [phone] 07/10/16, 5:13 PM  Name: Akanksha Bellmore MRN: [id number] Date of Birth: 1971/02/23

## 2016-07-12 ENCOUNTER — Ambulatory Visit: Payer: Medicare Other | Admitting: Physical Therapy

## 2016-07-17 ENCOUNTER — Ambulatory Visit: Payer: Medicare Other | Admitting: Physical Therapy

## 2016-07-20 ENCOUNTER — Ambulatory Visit: Payer: Medicare Other | Admitting: Physical Therapy

## 2016-08-09 ENCOUNTER — Ambulatory Visit: Payer: Medicare Other | Attending: Neurology | Admitting: Physical Therapy

## 2016-08-09 ENCOUNTER — Encounter: Payer: Self-pay | Admitting: Physical Therapy

## 2016-08-09 DIAGNOSIS — R2689 Other abnormalities of gait and mobility: Secondary | ICD-10-CM | POA: Diagnosis present

## 2016-08-09 DIAGNOSIS — R2681 Unsteadiness on feet: Secondary | ICD-10-CM | POA: Diagnosis not present

## 2016-08-09 NOTE — Therapy (Signed)
[facility] [address] [place], [place], [address] Phone: [phone]   Fax:  [phone]  Physical Therapy Treatment  Patient Details  Name: Connie Robertson MRN: [id number] Date of Birth: 1970-08-26 Referring Provider: Elon Alas. Jannifer Franklin, MD  Encounter Date: 08/09/2016      PT End of Session - 08/09/16 1456    Visit Number 19   Number of Visits 20   Date for PT Re-Evaluation 07/14/16   Authorization Type [employer] Medicare G-Code & Progress report   PT Start Time 1315   PT Stop Time 1400   PT Time Calculation (min) 45 min   Equipment Utilized During Treatment Gait belt   Activity Tolerance Patient tolerated treatment well   Behavior During Therapy WFL for tasks assessed/performed      Past Medical History:  Diagnosis Date  . Abnormality of gait 11/25/2014  . Chronic daily headache 2003   Love  . Depression   . Memory disorder 02/29/2016  . MS (multiple sclerosis) (HCC) 1992   Dr. Erling Cruz  . Multiple sclerosis ([place]) 11/21/2013  . Neurogenic bladder 11/21/2013    Past Surgical History:  Procedure Laterality Date  . ABLATION COLPOCLESIS  2004  . CESAREAN SECTION    . TUBAL LIGATION      There were no vitals filed for this visit.      Subjective Assessment - 08/09/16 1317    Subjective No falls. She has been stretching but not as much as should. Muscle spasm in left quad today.    Pertinent History HTN   Limitations Standing;Walking   Patient Stated Goals To get back to previous to exacerbation level including exercise class   Currently in Pain? Yes   Pain Score 2    Pain Location Leg   Pain Orientation Left   Pain Descriptors / Indicators Spasm   Pain Type Acute pain   Pain Onset Today   Pain Frequency Intermittent   Aggravating Factors  tightness   Pain Relieving Factors stretching            [facility] PT Assessment - 08/09/16 1315      Ambulation/Gait   Gait velocity 2.51 ft/sec comfortable &  3.29 ft/sec fast  cane     Functional Gait  Assessment   Gait assessed  Yes   Gait Level Surface Walks 20 ft in less than 7 sec but greater than 5.5 sec, uses assistive device, slower speed, mild gait deviations, or deviates 6-10 in outside of the 12 in walkway width.   Change in Gait Speed Able to change speed, demonstrates mild gait deviations, deviates 6-10 in outside of the 12 in walkway width, or no gait deviations, unable to achieve a major change in velocity, or uses a change in velocity, or uses an assistive device.   Gait with Horizontal Head Turns Performs head turns smoothly with slight change in gait velocity (eg, minor disruption to smooth gait path), deviates 6-10 in outside 12 in walkway width, or uses an assistive device.   Gait with Vertical Head Turns Performs task with slight change in gait velocity (eg, minor disruption to smooth gait path), deviates 6 - 10 in outside 12 in walkway width or uses assistive device   Gait and Pivot Turn Pivot turns safely in greater than 3 sec and stops with no loss of balance, or pivot turns safely within 3 sec and stops with mild imbalance, requires small steps to catch balance.   Step Over Obstacle Is able to  step over one shoe box (4.5 in total height) without changing gait speed. No evidence of imbalance.   Gait with Narrow Base of Support Ambulates less than 4 steps heel to toe or cannot perform without assistance.   Gait with Eyes Closed Walks 20 ft, uses assistive device, slower speed, mild gait deviations, deviates 6-10 in outside 12 in walkway width. Ambulates 20 ft in less than 9 sec but greater than 7 sec.   Ambulating Backwards Walks 20 ft, uses assistive device, slower speed, mild gait deviations, deviates 6-10 in outside 12 in walkway width.   Steps Alternating feet, must use rail.   Total Score 18   FGA comment: Initial 7/30  used single point cane                     [facility] Adult PT Treatment/Exercise - 08/09/16 1315       Transfers   Transfers Sit to Stand;Stand to Sit   Sit to Stand 7: Independent   Stand to Sit 7: Independent     Ambulation/Gait   Ambulation/Gait Yes   Ambulation/Gait Assistance 6: Modified independent (Device/Increase time)   Ambulation Distance (Feet) 400 Feet   Assistive device Straight cane;None   Ambulation Surface Indoor;Level   Stairs Assistance 6: Modified independent (Device/Increase time)   Stair Management Technique One rail Right;Step to pattern;Forwards   Number of Stairs 8   Ramp 6: Modified independent (Device)  cane   Curb 6: Modified independent (Device/increase time)  cane     Therapeutic Exercise: Supine with LLE over edge of mat table quad stretch 30 sec hold 2 reps. NuStep Level 3 with BUEs & BLEs X 10 min at 50-60spm with instruction in recommendation for seated recumbent machines that use BUEs & BLEs to control intensity.  Bil leg press 75# 10 reps with instruction in balance muscle groups with low weights 10-15 reps limiting to ~4 LE, 4 UE including chest / upper back.  Pt verbalized understanding of recommendations for exercising with MS          PT Education - 08/09/16 1455    Education provided Yes   Education Details reviewed MS & energy conservation, weight management, exercising at low intensity with moderation of exercises.    Person(s) Educated Patient   Methods Explanation;Verbal cues   Comprehension Verbalized understanding          PT Short Term Goals - 06/26/16 1454      PT SHORT TERM GOAL #1   Title Patient verbalizes understanding of energy conservation with activities with MS. (Target Date: 04/21/2016)   Baseline met on 04/17/16   Status Achieved     PT SHORT TERM GOAL #2   Title Patient ambulates 300' without AD with supervision. (Target Date: 04/21/2016) (New Target Date: 06/16/16)   Baseline Met 11/9.   Status Achieved     PT SHORT TERM GOAL #3   Title Patient negotiates ramps, curbs and stairs (1 rail) with single  point cane with mod I. (Target Date: 04/21/2016) (New Target Date: 06/16/16)   Baseline 11/9: using SPC, pt requires (S) for stair and ramp negotiation, supervision to descend curb, min guard to ascend curb   Status Not Met     PT SHORT TERM GOAL #4   Title Patient reaches 10" without balance loss. (Target Date: 04/21/2016)   Baseline GOAL MET: 04/21/16   Status Achieved     PT SHORT TERM GOAL #5   Title Patient reports 25% improvement in  vertigo with sit to stand and supine to sit. (Target Date: 04/21/2016)   Baseline GOAL MET: 05/19/16 (LTG was 50% improvement)   Time 4   Period Weeks   Status Achieved     PT SHORT TERM GOAL #6   Title Pt will improve Berg Balance score to > or = 43/56 to indicate improved static balance. (Target Date: 06/16/16)   Baseline 11/9: Merrilee Jansky = 44/56   Time 4   Period Weeks   Status Achieved     PT SHORT TERM GOAL #7   Title Pt will improve TUG time to < 13.5 sec to indicate decr risk for falls. (Target Date: 06/16/16)   Baseline 11/9: TUG = 13.15 sec with SPC   Time 4   Period Weeks   Status Achieved           PT Long Term Goals - 08/09/16 2301      PT LONG TERM GOAL #1   Title Berg Balance >45/56 to indicate lower fall risk. (Target Date: 07/14/2016)   Baseline 50/56 on 07/10/16   Time 8   Period Weeks   Status Achieved     PT LONG TERM GOAL #2   Title Patient ambulates 500' including grass, paved surfaces, gravel, and inclines/declines without device modified independent. (Target Date: 05/19/2016) (New Target Date: 07/14/16)   Baseline met 08/09/2016   Time 8   Period Weeks   Status Achieved     PT LONG TERM GOAL #3   Title Patient negotiates ramps, curbs & stairs (1 rail) without device modified independent. (Target Date: 05/19/2016) NEW TARGET DATE 07/14/2016   Baseline mod I on 07/10/16   Time 8   Period Weeks   Status Achieved     PT LONG TERM GOAL #4   Title Timed Up-Go <12 seconds without device independently. (Target Date:  05/19/2016)   Baseline 9.06 secs without AD 07/10/16   Time 8   Period Weeks   Status Achieved     PT LONG TERM GOAL #5   Title Patient reports 50% improvement in dizziness with sit to/from stand, supine to/from sit and rolling in bed. (Target Date: 05/19/2016)   Baseline GOAL MET: 05/22/16   Time 8   Period Weeks   Status Achieved     PT LONG TERM GOAL #6   Title Patient verbalizes application of energy conservation & exercising with MS / independent in HEP - ongoing fitness plan. (Target Date: 05/19/2016)  NEW TARGET DATE 07/14/2016   Baseline met 08/09/2016   Time 8   Period Weeks   Status Achieved     PT LONG TERM GOAL #7   Title Functional Gait Assessment > 14/30 to indicate lower fall risk. (Target Date: 07/14/2016)   Baseline met 08/09/2016   Time 8   Period Weeks   Status Achieved     PT LONG TERM GOAL #8   Title Timed Up-Go without device <13sec safely to indicate lower fall risk.  TARGET DATE 07/14/2016   Baseline 9.06 secs on 07/10/16 without AD   Time 8   Period Weeks   Status Achieved               Plan - 08/09/16 2303    Clinical Impression Statement Patienet met LTGs. She appears to understand MS energy conservation including appropriate exercsise levels.    Rehab Potential Good   Clinical Impairments Affecting Rehab Potential Fear, first exacerbation of MS   PT Frequency 1x / week   PT Duration  8 weeks   PT Treatment/Interventions ADLs/Self Care Home Management;DME Instruction;Gait training;Stair training;Functional mobility training;Therapeutic activities;Therapeutic exercise;Balance training;Neuromuscular re-education;Patient/family education;Vestibular;Canalith Repostioning;Manual techniques;Energy conservation   PT Next Visit Plan discharge   PT Home Exercise Plan Bridging, sit to stands; standing horizontal, vertical head turns for habituation   Consulted and Agree with Plan of Care Patient      Patient will benefit from skilled therapeutic  intervention in order to improve the following deficits and impairments:  Abnormal gait, Decreased activity tolerance, Decreased balance, Decreased knowledge of use of DME, Decreased mobility, Dizziness  Visit Diagnosis: Unsteadiness on feet  Other abnormalities of gait and mobility     Problem List Patient Active Problem List   Diagnosis Date Noted  . Visual disturbance 02/29/2016  . Memory disorder 02/29/2016  . Abnormality of gait 11/25/2014  . Multiple sclerosis ([place]) 11/21/2013  . Neurogenic bladder 11/21/2013  . Headache, tension type, chronic 11/17/2011  . Hot flashes 11/17/2011  . Essential hypertension, benign 09/20/2011  . Other screening mammogram 08/18/2011  . Routine general medical examination at a health care facility 08/18/2011   PHYSICAL THERAPY DISCHARGE SUMMARY  Visits from Start of Care: 19  Current functional level related to goals / functional outcomes: See above   Remaining deficits: See above   Education / Equipment: MS energy conservation & HEP / fitness plan  Plan: Patient agrees to discharge.  Patient goals were met. Patient is being discharged due to meeting the stated rehab goals.  ?????         Anju Sereno PT, DPT 08/09/2016, 11:07 PM  [facility] [address] [place], [place], [address] Phone: [phone]   Fax:  [phone]  Name: Connie Robertson MRN: [id number] Date of Birth: [birth year]

## 2016-08-30 ENCOUNTER — Telehealth: Payer: Self-pay | Admitting: Neurology

## 2016-08-30 NOTE — Telephone Encounter (Signed)
Spoke to [facility] in [place] who said that she did not receive PA request. Called [facility] to request that forms be re-faxed to [phone].

## 2016-08-30 NOTE — Telephone Encounter (Signed)
Received faxed PA request. Discussed w/ and given to [facility].

## 2016-08-30 NOTE — Telephone Encounter (Signed)
Connie Robertson with [phone] Pharmacy is calling regarding forms that were sent on 08-08-16 to get PA for medication natalizumab (TYSABRI) 300 MG/15ML injection. She says forms were never returned.

## 2016-09-04 ENCOUNTER — Ambulatory Visit (INDEPENDENT_AMBULATORY_CARE_PROVIDER_SITE_OTHER): Payer: Medicare Other | Admitting: Neurology

## 2016-09-04 ENCOUNTER — Encounter: Payer: Self-pay | Admitting: Neurology

## 2016-09-04 VITALS — BP 126/80 | HR 64 | Ht 64.5 in | Wt 219.8 lb

## 2016-09-04 DIAGNOSIS — G35 Multiple sclerosis: Secondary | ICD-10-CM

## 2016-09-04 DIAGNOSIS — R300 Dysuria: Secondary | ICD-10-CM | POA: Diagnosis not present

## 2016-09-04 DIAGNOSIS — R269 Unspecified abnormalities of gait and mobility: Secondary | ICD-10-CM | POA: Diagnosis not present

## 2016-09-04 DIAGNOSIS — Z5181 Encounter for therapeutic drug level monitoring: Secondary | ICD-10-CM | POA: Diagnosis not present

## 2016-09-04 NOTE — Progress Notes (Signed)
Reason for visit: Multiple sclerosis  Connie Robertson is an 46 y.o. female  History of present illness:  Connie Robertson is a 46 year old right-handed black female with a history of multiple sclerosis. The patient had some visual complaints involving the left eye on her last revisit in October 2017. The patient was set up for Solu-Medrol injections, and a visual evoked response test that was never done. The patient has undergone some physical therapy for her walking which has been beneficial. The patient remains on Tysabri, she is tolerating the medication well. She denies any new symptoms, she believes her left eye vision has improved. She is back into an exercise program. The patient denies any recent falls. She does have some symptoms of urinary tract infection currently with dysuria. She does have some chronic balance issues. She has chronic daily headaches, but headaches have overall improved in nature.   Past Medical History:  Diagnosis Date  . Abnormality of gait 11/25/2014  . Chronic daily headache 2003   Love  . Depression   . Memory disorder 02/29/2016  . MS (multiple sclerosis) (HCC) 1992   Dr. Sandria Manly  . Multiple sclerosis (HCC) 11/21/2013  . Neurogenic bladder 11/21/2013    Past Surgical History:  Procedure Laterality Date  . ABLATION COLPOCLESIS  2004  . CESAREAN SECTION    . TUBAL LIGATION      Family History  Problem Relation Age of Onset  . Heart failure Father   . Multiple sclerosis Sister   . Cancer Neg Hx   . Diabetes Neg Hx   . Hyperlipidemia Neg Hx   . Hypertension Neg Hx   . Kidney disease Neg Hx   . Stroke Neg Hx     Social history:  reports that she has never smoked. She has never used smokeless tobacco. She reports that she drinks about 1.2 oz of alcohol per week . She reports that she does not use drugs.   No Known Allergies  Medications:  Prior to Admission medications   Medication Sig Start Date End Date Taking? Authorizing Provider    ALPRAZolam Prudy Feeler) 0.5 MG tablet Take 2 tablets approximately 45 minutes prior to the MRI study, take a third tablet if needed. 02/29/16  Yes York Spaniel, MD  amitriptyline (ELAVIL) 25 MG tablet Take 2 tablets (50 mg total) by mouth at bedtime. 06/07/15  Yes Anson Fret, MD  amLODipine (NORVASC) 5 MG tablet Take 5 mg by mouth daily. 11/14/13  Yes Historical Provider, MD  amoxicillin (AMOXIL) 500 MG capsule Take 1 capsule (500 mg total) by mouth 3 (three) times daily. 03/17/16  Yes Tharon Aquas, PA  benazepril (LOTENSIN) 20 MG tablet Take 20 mg by mouth daily. 11/14/13  Yes Historical Provider, MD  gabapentin (NEURONTIN) 300 MG capsule Take 1 capsule (300 mg total) by mouth 2 (two) times daily. 05/15/16  Yes York Spaniel, MD  natalizumab (TYSABRI) 300 MG/15ML injection Inject into the vein.   Yes Historical Provider, MD  predniSONE (STERAPRED UNI-PAK 21 TAB) 10 MG (21) TBPK tablet Begin taking 6 tablets daily, taper by one tablet every other day until off the medication. 05/26/16  Yes York Spaniel, MD  solifenacin (VESICARE) 10 MG tablet Take 1 tablet (10 mg total) by mouth daily. 06/20/16  Yes York Spaniel, MD  SUMAtriptan (IMITREX) 100 MG tablet Take 1 tablet (100 mg total) by mouth 2 (two) times daily as needed for migraine. 09/01/15  Yes York Spaniel, MD  topiramate (  TOPAMAX) 50 MG tablet Take 4 tablets (200 mg total) by mouth at bedtime. 12/01/15  Yes Butch Penny, NP  valACYclovir (VALTREX) 1000 MG tablet Take 500 mg by mouth daily as needed (for outbreaks for 3 days).  11/02/15  Yes Historical Provider, MD  zaleplon (SONATA) 10 MG capsule Take 1 capsule (10 mg total) by mouth at bedtime as needed for sleep. Patient taking differently: Take 10 mg by mouth at bedtime.  03/01/16  Yes York Spaniel, MD    ROS:  Out of a complete 14 system review of symptoms, the patient complains only of the following symptoms, and all other reviewed systems are  negative.  Headache  Blood pressure 126/80, pulse 64, height 5' 4.5" (1.638 m), weight 219 lb 12 oz (99.7 kg).  Physical Exam  General: The patient is alert and cooperative at the time of the examination. The patient is moderately to markedly obese.  Skin: No significant peripheral edema is noted.   Neurologic Exam  Mental status: The patient is alert and oriented x 3 at the time of the examination. The patient has apparent normal recent and remote memory, with an apparently normal attention span and concentration ability.   Cranial nerves: Facial symmetry is present. Speech is normal, no aphasia or dysarthria is noted. Extraocular movements are full. Visual fields are full. Pupils are equal, round, and reactive to light. Discs are flat bilaterally.  Motor: The patient has good strength in all 4 extremities.  Sensory examination: Soft touch sensation is decreased on the left arm and left leg relative to the right, symmetric on the face.  Coordination: The patient has good finger-nose-finger and heel-to-shin bilaterally.  Gait and station: The patient has a slightly wide-based gait. Tandem gait is slightly unsteady. Romberg is negative. No drift is seen.  Reflexes: Deep tendon reflexes are symmetric.   Assessment/Plan:  1. Multiple sclerosis  2. Mild gait disorder  3. Chronic daily headache  The patient will undergo blood work today for the Tysabri, she will have urinalysis. If a bladder infection is present, we will call in antibiotics. The patient will follow-up in 6 months. She is tolerating the Tysabri well. The headaches remain daily, but are less bothersome to her.  Marlan Palau MD 09/04/2016 12:55 PM  [facility] [address] [place], [place] [address]  Phone [phone] Fax [phone]

## 2016-09-05 LAB — CBC WITH DIFFERENTIAL/PLATELET
BASOS ABS: 0 10*3/uL (ref 0.0–0.2)
BASOS: 0 %
EOS (ABSOLUTE): 0.1 10*3/uL (ref 0.0–0.4)
EOS: 3 %
HEMATOCRIT: 34.8 % (ref 34.0–46.6)
HEMOGLOBIN: 11.2 g/dL (ref 11.1–15.9)
IMMATURE GRANS (ABS): 0 10*3/uL (ref 0.0–0.1)
Immature Granulocytes: 0 %
LYMPHS ABS: 2.1 10*3/uL (ref 0.7–3.1)
LYMPHS: 46 %
MCH: 28.8 pg (ref 26.6–33.0)
MCHC: 32.2 g/dL (ref 31.5–35.7)
MCV: 90 fL (ref 79–97)
MONOCYTES: 9 %
Monocytes Absolute: 0.4 10*3/uL (ref 0.1–0.9)
NEUTROS ABS: 1.9 10*3/uL (ref 1.4–7.0)
Neutrophils: 42 %
Platelets: 178 10*3/uL (ref 150–379)
RBC: 3.89 x10E6/uL (ref 3.77–5.28)
RDW: 14.6 % (ref 12.3–15.4)
WBC: 4.6 10*3/uL (ref 3.4–10.8)

## 2016-09-05 LAB — COMPREHENSIVE METABOLIC PANEL
ALBUMIN: 4.1 g/dL (ref 3.5–5.5)
ALT: 24 IU/L (ref 0–32)
AST: 21 IU/L (ref 0–40)
Albumin/Globulin Ratio: 1.8 (ref 1.2–2.2)
Alkaline Phosphatase: 52 IU/L (ref 39–117)
BUN / CREAT RATIO: 14 (ref 9–23)
BUN: 13 mg/dL (ref 6–24)
CALCIUM: 9.4 mg/dL (ref 8.7–10.2)
CO2: 25 mmol/L (ref 18–29)
CREATININE: 0.95 mg/dL (ref 0.57–1.00)
Chloride: 103 mmol/L (ref 96–106)
GFR, EST AFRICAN AMERICAN: 82 mL/min/{1.73_m2} (ref >59)
GFR, EST NON AFRICAN AMERICAN: 72 mL/min/{1.73_m2} (ref >59)
GLUCOSE: 87 mg/dL (ref 65–99)
Globulin, Total: 2.3 g/dL (ref 1.5–4.5)
Potassium: 4.4 mmol/L (ref 3.5–5.2)
Sodium: 141 mmol/L (ref 134–144)
TOTAL PROTEIN: 6.4 g/dL (ref 6.0–8.5)

## 2016-09-12 ENCOUNTER — Telehealth: Payer: Self-pay | Admitting: Neurology

## 2016-09-12 NOTE — Telephone Encounter (Signed)
JC virus antibody is negative. 

## 2016-09-19 ENCOUNTER — Telehealth: Payer: Self-pay | Admitting: Neurology

## 2016-09-19 NOTE — Telephone Encounter (Signed)
Per [name] in [place], they are working on Connie Robertson. Patient has not missed any doses of medication per [name].

## 2016-09-19 NOTE — Telephone Encounter (Signed)
Ala Dach with [facility] is following up on PA for natalizumab (TYSABRI) 300 MG/15ML injection for the patient.

## 2016-09-19 NOTE — Telephone Encounter (Signed)
Checking with [name] with [facility]fusion on this. PA request given to them back on 08/30/16.

## 2016-09-25 ENCOUNTER — Encounter: Payer: Self-pay | Admitting: *Deleted

## 2016-09-25 NOTE — Progress Notes (Signed)
Faxed completed form to [employer] re: Tysabri. Fax: [phone]. Received confirmation.

## 2016-09-25 NOTE — Progress Notes (Signed)
Received fax notice from [employer] program. Touch prescribing program authorization valid from 10/19/2016-04/19/2017.

## 2016-09-27 ENCOUNTER — Other Ambulatory Visit: Payer: Self-pay | Admitting: Neurology

## 2016-09-27 ENCOUNTER — Telehealth: Payer: Self-pay | Admitting: Neurology

## 2016-09-27 MED ORDER — OXYBUTYNIN CHLORIDE ER 10 MG PO TB24: 10.0000 mg | ORAL_TABLET | Freq: Two times a day (BID) | ORAL | 0 refills | Status: DC

## 2016-09-27 NOTE — Telephone Encounter (Signed)
Received fax notice from [employer] PA tysabri approved through 08/06/2017.

## 2016-09-27 NOTE — Telephone Encounter (Signed)
The patient comes in today for a Tysabri infusion, she indicates that she has been doubling up on her Vesicare, she has run out and needs an early refill.  I indicated that she should not take more than 10 mg daily, if this is not adequate for controlling the bladder issues, she may require a urology referral, she will let me know about this.  Insurance will not pay for an early refill until March, I will place her on oxybutynin 10 mg twice daily.

## 2016-09-28 ENCOUNTER — Telehealth: Payer: Self-pay | Admitting: Neurology

## 2016-09-28 LAB — MICROSCOPIC EXAMINATION: CASTS: NONE SEEN /LPF

## 2016-09-28 LAB — URINALYSIS, ROUTINE W REFLEX MICROSCOPIC
BILIRUBIN UA: NEGATIVE
Glucose, UA: NEGATIVE
Leukocytes, UA: NEGATIVE
NITRITE UA: POSITIVE — AB
Protein, UA: NEGATIVE
RBC UA: NEGATIVE
Specific Gravity, UA: 1.023 (ref 1.005–1.030)
UUROB: 0.2 mg/dL (ref 0.2–1.0)
pH, UA: 6 (ref 5.0–7.5)

## 2016-09-28 MED ORDER — CIPROFLOXACIN HCL 250 MG PO TABS: 250.0000 mg | ORAL_TABLET | Freq: Two times a day (BID) | ORAL | 0 refills | Status: DC

## 2016-09-28 NOTE — Telephone Encounter (Signed)
I called patient. The urinalysis did show a positive nitrite, the patient has reported 2 weeks of change in odor of the urine, she has had bladder infections previously. There are no significant changes in white blood cells, only a few bacteria.  I will opt to give the patient is short course of Cipro.

## 2016-11-02 ENCOUNTER — Other Ambulatory Visit: Payer: Self-pay | Admitting: Neurology

## 2016-11-03 ENCOUNTER — Telehealth: Payer: Self-pay | Admitting: Neurology

## 2016-11-03 NOTE — Telephone Encounter (Signed)
Called and spoke with pt. Advised we sent refill for oxybutynin to requested pharmacy.  However, we did not originally prescribe amlodipine and benazepril. She will have to contact PCP for refills on those. She verbalized understanding.

## 2016-11-03 NOTE — Telephone Encounter (Signed)
Pt states she has no refills on any of her medications  amLODipine (NORVASC) 5 MG tablet  benazepril (LOTENSIN) 20 MG tablet   oxybutynin (DITROPAN-XL) 10 MG 24 hr tablet  She would still like to use  [id number] - [place], [place] - [address] AT [address] [facility] [address]

## 2016-11-06 ENCOUNTER — Telehealth: Payer: Self-pay | Admitting: Neurology

## 2016-11-06 NOTE — Telephone Encounter (Signed)
Pt requesting a call back with concerns about trembling in her face, this has been going on for 2 days and she does not know the reason.

## 2016-11-06 NOTE — Telephone Encounter (Signed)
LM to call back, need to get more details.

## 2016-11-07 MED ORDER — PREDNISONE 5 MG PO TABS: ORAL_TABLET | ORAL | 0 refills | Status: DC

## 2016-11-07 NOTE — Addendum Note (Signed)
Addended by: York Spaniel on: 11/07/2016 11:01 AM   Modules accepted: Orders

## 2016-11-07 NOTE — Telephone Encounter (Signed)
Patient called back.  States that she is having R sided facial twitching that started on Friday and is still having it today.  She denies being stressed, reports that she has been sleeping and resting well.  Patient says that this is what happened when she was first diagnosed with MS. She asks what can cause this and what does she need to do?

## 2016-11-07 NOTE — Telephone Encounter (Signed)
I called patient. The patient has developed over the last 2 days what sounds like right hemifacial spasm. She had this initially prior to the onset of her multiple sclerosis diagnosis.  I will send in a prescription for prednisone, 5 mg 6 day pack. She will call if the symptoms did not resolve.

## 2016-11-08 NOTE — Telephone Encounter (Signed)
Pt called back said the tremors cover the entire facial area. She has taken 6 tabs of prednisone yesterday and 2 tabs today. She seems to think her vision is effected also. Please call

## 2016-11-08 NOTE — Telephone Encounter (Signed)
I called patient. The patient indicates that the facial twitching is now on both sides of the face and she is having some alteration of vision, I will try to get her in for a urgent work in revisit.

## 2016-11-09 ENCOUNTER — Encounter (INDEPENDENT_AMBULATORY_CARE_PROVIDER_SITE_OTHER): Payer: Self-pay

## 2016-11-09 ENCOUNTER — Ambulatory Visit (INDEPENDENT_AMBULATORY_CARE_PROVIDER_SITE_OTHER): Payer: Medicare Other | Admitting: Neurology

## 2016-11-09 ENCOUNTER — Encounter: Payer: Self-pay | Admitting: Neurology

## 2016-11-09 VITALS — BP 140/83 | HR 46 | Ht 64.5 in | Wt 216.5 lb

## 2016-11-09 DIAGNOSIS — R413 Other amnesia: Secondary | ICD-10-CM

## 2016-11-09 DIAGNOSIS — G35 Multiple sclerosis: Secondary | ICD-10-CM

## 2016-11-09 DIAGNOSIS — R269 Unspecified abnormalities of gait and mobility: Secondary | ICD-10-CM | POA: Diagnosis not present

## 2016-11-09 DIAGNOSIS — G514 Facial myokymia: Secondary | ICD-10-CM | POA: Diagnosis not present

## 2016-11-09 MED ORDER — PREDNISONE 10 MG PO TABS: ORAL_TABLET | ORAL | 0 refills | Status: DC

## 2016-11-09 MED ORDER — ALPRAZOLAM 0.5 MG PO TABS: ORAL_TABLET | ORAL | 0 refills | Status: DC

## 2016-11-09 NOTE — Telephone Encounter (Signed)
Called and scheduled pt for 11/10/16 at 12pm check in 1130am. Pt verbalized understanding. She asked to be called if there are any cancellations today to let her know. She would like to come in today if possible. Advised I will call if there are any cx.

## 2016-11-09 NOTE — Progress Notes (Signed)
Reason for visit: Multiple sclerosis  Connie Robertson is an 46 y.o. female  History of present illness:  Connie Robertson is a 46 year old right-handed black female with a history of multiple sclerosis. The patient comes into the office today as an urgent work in for evaluation of new problems that developed about 5 or 6 days ago. The patient began noting twitching of the right face, she has now also noted similar sensations on the left side of the face, she has had some slight blurring of vision, no significant visual alteration. She denies any change in speech or swallowing, she denies any new numbness or weakness of the arms or legs. She denies any change in bowel or bladder control or change in balance. 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 She returns to this office for an evaluation.  Past Medical History:  Diagnosis Date  . Abnormality of gait 11/25/2014  . Chronic daily headache 2003   Love  . Depression   . Memory disorder 02/29/2016  . MS (multiple sclerosis) (HCC) 1992   Dr. Sandria Manly  . Multiple sclerosis (HCC) 11/21/2013  . Neurogenic bladder 11/21/2013    Past Surgical History:  Procedure Laterality Date  . ABLATION COLPOCLESIS  2004  . CESAREAN SECTION    . TUBAL LIGATION      Family History  Problem Relation Age of Onset  . Heart failure Father   . Multiple sclerosis Sister   . Cancer Neg Hx   . Diabetes Neg Hx   . Hyperlipidemia Neg Hx   . Hypertension Neg Hx   . Kidney disease Neg Hx   . Stroke Neg Hx     Social history:  reports that she has never smoked. She has never used smokeless tobacco. She reports that she drinks about 1.2 oz of alcohol per week . She reports that she does not use drugs.   No Known Allergies  Medications:  Prior to Admission medications   Medication Sig Start Date End Date Taking? Authorizing Provider  ALPRAZolam Prudy Feeler) 0.5 MG tablet Take 2 tablets approximately 45 minutes prior to the MRI study, take  a third tablet if needed. 02/29/16  Yes York Spaniel, MD  amitriptyline (ELAVIL) 25 MG tablet Take 2 tablets (50 mg total) by mouth at bedtime. 06/07/15  Yes Anson Fret, MD  amLODipine (NORVASC) 5 MG tablet Take 5 mg by mouth daily. 11/14/13  Yes Historical Provider, MD  benazepril (LOTENSIN) 20 MG tablet Take 20 mg by mouth daily. 11/14/13  Yes Historical Provider, MD  gabapentin (NEURONTIN) 300 MG capsule Take 1 capsule (300 mg total) by mouth 2 (two) times daily. 05/15/16  Yes York Spaniel, MD  natalizumab (TYSABRI) 300 MG/15ML injection Inject into the vein.   Yes Historical Provider, MD  oxybutynin (DITROPAN-XL) 10 MG 24 hr tablet TAKE 1 TABLET BY MOUTH TWICE DAILY 11/03/16  Yes York Spaniel, MD  predniSONE (DELTASONE) 5 MG tablet Begin taking 6 tablets daily, taper by one tablet daily until off the medication. 11/07/16  Yes York Spaniel, MD  solifenacin (VESICARE) 10 MG tablet Take 1 tablet (10 mg total) by mouth daily. 06/20/16  Yes York Spaniel, MD  SUMAtriptan (IMITREX) 100 MG tablet Take 1 tablet (100 mg total) by mouth 2 (two) times daily as needed for migraine. 09/01/15  Yes York Spaniel, MD  topiramate (TOPAMAX) 50 MG tablet Take 4 tablets (200 mg total) by mouth at bedtime. 12/01/15  Yes Butch Penny, NP  valACYclovir (VALTREX) 1000 MG tablet Take 500 mg by mouth daily as needed (for outbreaks for 3 days).  11/02/15  Yes Historical Provider, MD  zaleplon (SONATA) 10 MG capsule Take 1 capsule (10 mg total) by mouth at bedtime as needed for sleep. Patient taking differently: Take 10 mg by mouth at bedtime.  03/01/16  Yes York Spaniel, MD    ROS:  Out of a complete 14 system review of symptoms, the patient complains only of the following symptoms, and all other reviewed systems are negative.  Headache  Blood pressure 140/83, pulse (!) 46, height 5' 4.5" (1.638 m), weight 216 lb 8 oz (98.2 kg).  Physical Exam  General: The patient is alert and cooperative  at the time of the examination.  Skin: No significant peripheral edema is noted.   Neurologic Exam  Mental status: The patient is alert and oriented x 3 at the time of the examination. The patient has apparent normal recent and remote memory, with an apparently normal attention span and concentration ability.   Cranial nerves: Facial symmetry is present. Speech is normal, no aphasia or dysarthria is noted. Extraocular movements are full. Visual fields are full. The patient does have evidence of mild, and the muscles involving the periocular area and around the mouth, this is seen on the right side, not seen on the left.  Motor: The patient has good strength in all 4 extremities.  Sensory examination: Soft touch sensation is symmetric on the face, but is decreased on the left arm and leg.  Coordination: The patient has good finger-nose-finger and heel-to-shin bilaterally.  Gait and station: The patient has a slightly wide-based, unsteady gait. Tandem gait is unsteady. Romberg is negative. No drift is seen.  Reflexes: Deep tendon reflexes are symmetric.   Assessment/Plan:  1. Multiple sclerosis  2. Gait disorder  3. Facial myokymia, right side, new onset  The patient is on Tysabri, the onset of facial myokymia likely represents a minor MS attack. The patient will be given a prednisone Dosepak, she will undergo a repeat MRI of the brain given the fact that she is on Tysabri. She will follow-up for her next scheduled appointment.   Marlan Palau MD 11/09/2016 1:39 PM  [facility] [address] [place], [place] [address]  Phone [phone] Fax [phone]

## 2016-11-09 NOTE — Patient Instructions (Signed)
   We will extend the prednisone therapy, and Check MRI of the brain.

## 2016-11-10 ENCOUNTER — Ambulatory Visit: Payer: Self-pay | Admitting: Neurology

## 2016-11-15 ENCOUNTER — Telehealth: Payer: Self-pay | Admitting: *Deleted

## 2016-11-15 ENCOUNTER — Encounter: Payer: Self-pay | Admitting: Neurology

## 2016-11-15 NOTE — Telephone Encounter (Signed)
Susanne from [facility] called stating results back for MRI brain. She will fax report to [phone], Christiane Ha. She asked me to call  Back if I don't receive.

## 2016-11-15 NOTE — Telephone Encounter (Signed)
I called the patient. MRI the brain was done through [facility], I cannot visualize the scan directly. It is compared to a study done in 2002, just mild progression, this would not be unexpected. No enhancing lesions are noted, no evidence of PML. I discussed this with the patient. I will try to get the disc of the study that was done.

## 2016-11-15 NOTE — Telephone Encounter (Signed)
Received report via fax. Gave to CW,MD for review.  

## 2016-11-16 ENCOUNTER — Telehealth: Payer: Self-pay | Admitting: Neurology

## 2016-11-16 NOTE — Telephone Encounter (Signed)
Patient is calling to discuss predniSONE (DELTASONE) 10 MG tablet. She says she has not seen any results since taking it.

## 2016-11-16 NOTE — Telephone Encounter (Signed)
Called and spoke with French Ana in medical records department at [facility]. She will mail copy of MRI CD to our office. Gave [id number] [place], [place] [address].

## 2016-11-16 NOTE — Telephone Encounter (Signed)
I called patient. The patient is to complete the prednisone Dosepak first, if the symptoms continue, we may try a course of low-dose Dilantin to see if that helps the myokymia.

## 2016-11-27 NOTE — Telephone Encounter (Signed)
Received MRI CD from Hoy Morn in medical records. Gave to CW,MD for review.

## 2016-11-27 NOTE — Telephone Encounter (Signed)
I called the patient. The MRI just done at [facility] and a prior study done in August 2017 a very similar, fairly extensive periventricular white matter changes, I do not see any new brainstem lesions.  If the hemifacial spasm continues, we may consider a medication such as Dilantin to help improve the symptoms if the prednisone did not help. The patient will let me know about this.

## 2016-11-28 ENCOUNTER — Other Ambulatory Visit: Payer: Self-pay | Admitting: Neurology

## 2016-11-28 ENCOUNTER — Telehealth: Payer: Self-pay | Admitting: Neurology

## 2016-11-28 MED ORDER — PHENYTOIN SODIUM EXTENDED 100 MG PO CAPS: 100.0000 mg | ORAL_CAPSULE | Freq: Two times a day (BID) | ORAL | 1 refills | Status: DC

## 2016-11-28 NOTE — Telephone Encounter (Signed)
I called patient. The patient is still having facial spasms, we will try adding low-dose Dilantin to see if this helps. The patient is having some discomfort with the spasms at this time.

## 2016-12-07 ENCOUNTER — Other Ambulatory Visit: Payer: Self-pay | Admitting: Adult Health

## 2017-01-23 ENCOUNTER — Other Ambulatory Visit: Payer: Self-pay | Admitting: Neurology

## 2017-03-12 ENCOUNTER — Ambulatory Visit: Payer: Medicare Other | Admitting: Adult Health

## 2017-03-27 ENCOUNTER — Other Ambulatory Visit: Payer: Self-pay | Admitting: Neurology

## 2017-03-31 ENCOUNTER — Other Ambulatory Visit: Payer: Self-pay | Admitting: Neurology

## 2017-04-08 ENCOUNTER — Other Ambulatory Visit: Payer: Self-pay | Admitting: Neurology

## 2017-04-10 ENCOUNTER — Telehealth: Payer: Self-pay | Admitting: *Deleted

## 2017-04-10 NOTE — Telephone Encounter (Signed)
Faxed completed/signed Tysabri pt status report and reauth questionnaire to [employer] at [phone]. Received confirmation.  

## 2017-04-10 NOTE — Telephone Encounter (Signed)
Received fax notification from [employer] prescribing program. Patient authorized from 04/20/17-10/19/17.  Patient enrollment number: [id number] Site authorization number: [id number] Site: [facility]

## 2017-04-26 ENCOUNTER — Telehealth: Payer: Self-pay | Admitting: Neurology

## 2017-04-26 NOTE — Telephone Encounter (Signed)
I called patient. The patient is having headaches about every other day, she is on Topamax, gabapentin, and amitriptyline without benefit.  She would be a candidate for Aimovig therapy, she will discuss this when she comes in for her revisit on 05/15/2017.

## 2017-04-26 NOTE — Telephone Encounter (Signed)
Pt states she has heard about Aimovig and would like to know if she could be a canidate for this medication for her migraines, please call

## 2017-04-30 ENCOUNTER — Other Ambulatory Visit: Payer: Self-pay | Admitting: Neurology

## 2017-05-01 ENCOUNTER — Other Ambulatory Visit: Payer: Self-pay | Admitting: Neurology

## 2017-05-01 MED ORDER — PHENYTOIN SODIUM EXTENDED 100 MG PO CAPS: ORAL_CAPSULE | ORAL | 0 refills | Status: DC

## 2017-05-02 ENCOUNTER — Ambulatory Visit (HOSPITAL_COMMUNITY)
Admission: EM | Admit: 2017-05-02 | Discharge: 2017-05-02 | Disposition: A | Discharge status: Home / Self Care | Payer: Medicare Other | Attending: Family Medicine | Admitting: Family Medicine

## 2017-05-02 ENCOUNTER — Encounter (HOSPITAL_COMMUNITY): Payer: Self-pay | Admitting: Emergency Medicine

## 2017-05-02 DIAGNOSIS — Z9889 Other specified postprocedural states: Secondary | ICD-10-CM | POA: Insufficient documentation

## 2017-05-02 DIAGNOSIS — Z820 Family history of epilepsy and other diseases of the nervous system: Secondary | ICD-10-CM | POA: Insufficient documentation

## 2017-05-02 DIAGNOSIS — F329 Major depressive disorder, single episode, unspecified: Secondary | ICD-10-CM | POA: Diagnosis not present

## 2017-05-02 DIAGNOSIS — I10 Essential (primary) hypertension: Secondary | ICD-10-CM | POA: Insufficient documentation

## 2017-05-02 DIAGNOSIS — N390 Urinary tract infection, site not specified: Secondary | ICD-10-CM | POA: Diagnosis present

## 2017-05-02 DIAGNOSIS — Z79899 Other long term (current) drug therapy: Secondary | ICD-10-CM | POA: Insufficient documentation

## 2017-05-02 DIAGNOSIS — L298 Other pruritus: Secondary | ICD-10-CM | POA: Diagnosis not present

## 2017-05-02 DIAGNOSIS — G35 Multiple sclerosis: Secondary | ICD-10-CM | POA: Diagnosis not present

## 2017-05-02 DIAGNOSIS — Z8249 Family history of ischemic heart disease and other diseases of the circulatory system: Secondary | ICD-10-CM | POA: Insufficient documentation

## 2017-05-02 DIAGNOSIS — N898 Other specified noninflammatory disorders of vagina: Secondary | ICD-10-CM

## 2017-05-02 LAB — POCT URINALYSIS DIP (DEVICE)
BILIRUBIN URINE: NEGATIVE
Glucose, UA: NEGATIVE mg/dL
KETONES UR: NEGATIVE mg/dL
Nitrite: POSITIVE — AB
PH: 7 (ref 5.0–8.0)
PROTEIN: NEGATIVE mg/dL
SPECIFIC GRAVITY, URINE: 1.015 (ref 1.005–1.030)
Urobilinogen, UA: 0.2 mg/dL (ref 0.0–1.0)

## 2017-05-02 MED ORDER — CEPHALEXIN 500 MG PO CAPS
500.0000 mg | ORAL_CAPSULE | Freq: Four times a day (QID) | ORAL | 0 refills | Status: DC
Start: 2017-05-02 — End: 2017-05-15

## 2017-05-02 MED ORDER — FLUCONAZOLE 150 MG PO TABS
ORAL_TABLET | ORAL | 0 refills | Status: DC
Start: 2017-05-02 — End: 2017-05-15

## 2017-05-02 NOTE — ED Notes (Signed)
Patient is unable to void at this time 

## 2017-05-02 NOTE — Discharge Instructions (Signed)
Take the medication as directed. Be sure to drink plenty of fluids. Follow-up with your primary care doctor as needed.

## 2017-05-02 NOTE — ED Provider Notes (Signed)
[facility]    CSN: [id number] Arrival date & time: 05/02/17  4098     History   Chief Complaint Chief Complaint  Patient presents with  . Urinary Tract Infection    HPI Connie Robertson is a 46 y.o. female.   46 year old female complains of malodorous urine and vaginal itching for approximately one month. She denies dysuria or frequency but does have urgency. She also has a diagnosis of neurogenic bladder which somewhat confuses her symptomatology according to her. She has been using badges seal without relief. She denies vaginal discharge but does have vulvovaginal itching. Denies pelvic pain, nausea or vomiting, fever or back pain.      Past Medical History:  Diagnosis Date  . Abnormality of gait 11/25/2014  . Chronic daily headache 2003   Love  . Depression   . Memory disorder 02/29/2016  . MS (multiple sclerosis) (HCC) 1992   Dr. Sandria Manly  . Multiple sclerosis (HCC) 11/21/2013  . Neurogenic bladder 11/21/2013    Patient Active Problem List   Diagnosis Date Noted  . 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 Visual disturbance 02/29/2016  . Memory disorder 02/29/2016  . Abnormality of gait 11/25/2014  . Multiple sclerosis (HCC) 11/21/2013  . Neurogenic bladder 11/21/2013  . Headache, tension type, chronic 11/17/2011  . Hot flashes 11/17/2011  . Essential hypertension, benign 09/20/2011  . Other screening mammogram 08/18/2011  . Routine general medical examination at a health care facility 08/18/2011    Past Surgical History:  Procedure Laterality Date  . ABLATION COLPOCLESIS  2004  . CESAREAN SECTION    . TUBAL LIGATION      OB History    Gravida Para Term Preterm AB Living   SAB TAB Ectopic Multiple Live Births   1               Home Medications    Prior to Admission medications   Medication Sig Start Date End Date Taking? Authorizing Provider  ALPRAZolam Prudy Feeler) 0.5 MG tablet Take 2 tablets approximately 45 minutes prior to the MRI  study, take a third tablet if needed. 02/29/16  Yes York Spaniel, MD  amitriptyline (ELAVIL) 25 MG tablet Take 2 tablets (50 mg total) by mouth at bedtime. 06/07/15  Yes Anson Fret, MD  oxybutynin (DITROPAN-XL) 10 MG 24 hr tablet TAKE 1 TABLET BY MOUTH TWICE DAILY 03/28/17  Yes York Spaniel, MD  topiramate (TOPAMAX) 50 MG tablet TAKE 4 TABLETS(200 MG) BY MOUTH AT BEDTIME 12/08/16  Yes York Spaniel, MD  valACYclovir (VALTREX) 1000 MG tablet Take 500 mg by mouth daily as needed (for outbreaks for 3 days).  11/02/15  Yes [provider]  ALPRAZolam Prudy Feeler) 0.5 MG tablet Take 2 tablets approximately 45 minutes prior to the MRI study, take a third tablet if needed. 11/09/16   York Spaniel, MD  amLODipine (NORVASC) 5 MG tablet Take 5 mg by mouth daily. 11/14/13   [provider]  benazepril (LOTENSIN) 20 MG tablet Take 20 mg by mouth daily. 11/14/13   [provider]  cephALEXin (KEFLEX) 500 MG capsule Take 1 capsule (500 mg total) by mouth 4 (four) times daily. 05/02/17   Hayden Rasmussen, NP  fluconazole (DIFLUCAN) 150 MG tablet 1 tab po x 1. May repeat in 72 hours if no improvement 05/02/17   Hayden Rasmussen, NP  gabapentin (NEURONTIN) 300 MG capsule Take 1 capsule (300 mg total) by mouth 2 (two)  times daily. 05/15/16   York Spaniel, MD  natalizumab (TYSABRI) 300 MG/15ML injection Inject into the vein.    [provider]  phenytoin (DILANTIN) 100 MG ER capsule TAKE 1 CAPSULE(100 MG) BY MOUTH TWICE DAILY 05/01/17   York Spaniel, MD  solifenacin (VESICARE) 10 MG tablet Take 1 tablet (10 mg total) by mouth daily. 06/20/16   York Spaniel, MD  SUMAtriptan (IMITREX) 100 MG tablet Take 1 tablet (100 mg total) by mouth 2 (two) times daily as needed for migraine. 09/01/15   York Spaniel, MD  zaleplon (SONATA) 10 MG capsule Take 1 capsule (10 mg total) by mouth at bedtime as needed for sleep. Patient taking differently: Take 10 mg by mouth at bedtime.   03/01/16   York Spaniel, MD    Family History Family History  Problem Relation Age of Onset  . Heart failure Father   . Multiple sclerosis Sister   . Cancer Neg Hx   . Diabetes Neg Hx   . Hyperlipidemia Neg Hx   . Hypertension Neg Hx   . Kidney disease Neg Hx   . Stroke Neg Hx     Social History Social History  Substance Use Topics  . Smoking status: Never Smoker  . Smokeless tobacco: Never Used  . Alcohol use 1.2 oz/week    2 Standard drinks or equivalent per week     Allergies   Patient has no known allergies.   Review of Systems Review of Systems  Constitutional: Negative.   Respiratory: Negative.   Gastrointestinal: Negative.   Genitourinary: Negative for decreased urine volume, dysuria, genital sores, pelvic pain and vaginal discharge.       Urinary incontinence, uncertain if due to neurogenic bladder or possible UTI  All other systems reviewed and are negative.    Physical Exam Triage Vital Signs ED Triage Vitals  Enc Vitals Group     BP 05/02/17 1025 121/81     Pulse Rate 05/02/17 1025 61     Resp 05/02/17 1025 18     Temp 05/02/17 1019 98.6 F (37 C)     Temp Source 05/02/17 1019 Oral     SpO2 05/02/17 1025 100 %     Weight --      Height --      Head Circumference --      Peak Flow --      Pain Score --      Pain Loc --      Pain Edu? --      Excl. in GC? --    No data found.   Updated Vital Signs BP 121/81 (BP Location: Right Arm)   Pulse 61   Temp 98.6 F (37 C) (Oral)   Resp 18   SpO2 100%   Visual Acuity Right Eye Distance:   Left Eye Distance:   Bilateral Distance:    Right Eye Near:   Left Eye Near:    Bilateral Near:     Physical Exam  Constitutional: She is oriented to person, place, and time. She appears well-developed and well-nourished. No distress.  Eyes: EOM are normal.  Neck: Neck supple.  Cardiovascular: Normal rate.   Pulmonary/Chest: Effort normal. No respiratory distress.  Genitourinary:    Genitourinary Comments: Charlotte, RN present Normal external female genitalia. No observed discharge, no erythema or rash seen in the vulvovaginal or perineal area. Scant amount of mucousy discharge over the vaginal walls but not convincing of pathology. Cervix is without discoloration  or tenderness. No drainage from the os. Os is closed. No lesions.  Musculoskeletal: She exhibits no edema.  Neurological: She is alert and oriented to person, place, and time. She exhibits normal muscle tone.  Skin: Skin is warm and dry.  Psychiatric: She has a normal mood and affect.  Nursing note and vitals reviewed.    UC Treatments / Results  Labs (all labs ordered are listed, but only abnormal results are displayed) Labs Reviewed  POCT URINALYSIS DIP (DEVICE) - Abnormal; Notable for the following:       Result Value   Hgb urine dipstick MODERATE (*)    Nitrite POSITIVE (*)    Leukocytes, UA SMALL (*)    All other components within normal limits  CERVICOVAGINAL ANCILLARY ONLY    EKG  EKG Interpretation None       Radiology No results found.  Procedures Procedures (including critical care time)  Medications Ordered in UC Medications - No data to display   Initial Impression / Assessment and Plan / UC Course  I have reviewed the triage vital signs and the nursing notes.  Pertinent labs & imaging results that were available during my care of the patient were reviewed by me and considered in my medical decision making (see chart for details).       Final Clinical Impressions(s) / UC Diagnoses   Final diagnoses:  Lower urinary tract infectious disease  Vaginal itching    New Prescriptions New Prescriptions   CEPHALEXIN (KEFLEX) 500 MG CAPSULE    Take 1 capsule (500 mg total) by mouth 4 (four) times daily.   FLUCONAZOLE (DIFLUCAN) 150 MG TABLET    1 tab po x 1. May repeat in 72 hours if no improvement    Having to wait on patient to obtain a urine specimen, aprox 1 hour.   Controlled Substance Prescriptions [place] Controlled Substance Registry consulted? Not Applicable   Hayden Rasmussen, NP 05/02/17 1154

## 2017-05-02 NOTE — ED Triage Notes (Signed)
Reports urine has a strong, foul odor.  Symptoms for one week.  Patient thinks she has a yeast infection.  Patient denies vaginal discharge and does complain of itching.

## 2017-05-02 NOTE — ED Notes (Signed)
Patient still unable to void. Provided patient with the second cup of water

## 2017-05-03 LAB — CERVICOVAGINAL ANCILLARY ONLY
Bacterial vaginitis: POSITIVE — AB
Candida vaginitis: NEGATIVE
Chlamydia: NEGATIVE
NEISSERIA GONORRHEA: NEGATIVE
Trichomonas: NEGATIVE

## 2017-05-04 LAB — URINE CULTURE: SPECIAL REQUESTS: NORMAL

## 2017-05-07 ENCOUNTER — Other Ambulatory Visit: Payer: Self-pay | Admitting: Neurology

## 2017-05-07 ENCOUNTER — Telehealth (HOSPITAL_COMMUNITY): Payer: Self-pay | Admitting: Emergency Medicine

## 2017-05-07 MED ORDER — METRONIDAZOLE 500 MG PO TABS: 500.0000 mg | ORAL_TABLET | Freq: Two times a day (BID) | ORAL | 0 refills | Status: DC

## 2017-05-07 MED ORDER — ERENUMAB-AOOE 70 MG/ML ~~LOC~~ SOAJ: 140.0000 mg | SUBCUTANEOUS | 3 refills | Status: DC

## 2017-05-07 NOTE — Telephone Encounter (Signed)
Called pt to notify of recent lab results.... Pt ID'd properly Reports feeling better and sx are subsiding Pt is taking/tolarating well meds given at visit. Sts she wants Flagyl to be sent to [facility] ([address]) even though she is asymptomatic.   Adv pt if sx are not getting better to return or to f/u w/PCP Education on safe sex given Notified pt that lab results can be obtained through MyChart Pt verb understanding.

## 2017-05-07 NOTE — Addendum Note (Signed)
Addended by: York Spaniel on: 05/07/2017 04:03 PM   Modules accepted: Orders

## 2017-05-07 NOTE — Telephone Encounter (Signed)
-----   Message from Eustace Moore, MD sent at 05/04/2017  2:52 PM EDT ----- Please let patient know that urine culture was positive for E coli germ, sensitive to cephalexin rx given at the urgent care visit 9/26.  Finish cephalexin. Also, please let patient know that test for gardnerella (bacterial vaginosis) was positive.  This only needs to be treated if there are persistent symptoms, such as vaginal irritation/discharge.   If these symptoms are present, ok to send rx for metronidazole 500mg  bid x 7d #14 no refills or metronidazole vaginal gel 0.75% 1 applicatorful bid x 7d #14 no refills.   Recheck or followup with PCP for further evaluation if symptoms are not improving.  LM

## 2017-05-07 NOTE — Telephone Encounter (Signed)
Pt is asking to be called back to f/u on her being a canidate for the Aimovig therapy.  Please call

## 2017-05-07 NOTE — Telephone Encounter (Signed)
The patient is having ongoing severe headaches at this time. She has not responded to any medical therapy.  I will go ahead and send in a prescription for Aimovig, the patient is not doing well at all with her headaches. This medication will require preauthorization.  She has a revisit on 05/15/2017.

## 2017-05-11 ENCOUNTER — Telehealth: Payer: Self-pay | Admitting: *Deleted

## 2017-05-11 NOTE — Telephone Encounter (Signed)
Submitted PA aimovig on covermymeds. Key: [id number]. Awaiting response from insurance.

## 2017-05-14 MED ORDER — DIVALPROEX SODIUM 500 MG PO DR TAB: DELAYED_RELEASE_TABLET | ORAL | 2 refills | Status: DC

## 2017-05-14 NOTE — Addendum Note (Signed)
Addended by: York Spaniel on: 05/14/2017 05:38 PM   Modules accepted: Orders

## 2017-05-14 NOTE — Telephone Encounter (Signed)
There request for Aimovig was apparently denied. They aren't requiring a trial on Depakote, propranolol, or timolol.  The patient has a history depression, the use of beta blockers would be contraindicated assistant would exacerbate depression.  The patient has had a tubal ligation, we could potentially try Depakote.  The patient was seen in office tomorrow, I will call in a perception for Depakote and we will try this for the headache, if she does not tolerated or it does not work, I will send a letter in to the insurance company regarding Aimovig.  Once again, given the history depression, the use of beta blockers is contraindicated.

## 2017-05-15 ENCOUNTER — Encounter: Payer: Self-pay | Admitting: Adult Health

## 2017-05-15 ENCOUNTER — Ambulatory Visit (INDEPENDENT_AMBULATORY_CARE_PROVIDER_SITE_OTHER): Payer: Medicare Other | Admitting: Adult Health

## 2017-05-15 VITALS — BP 116/80 | HR 71 | Ht 64.5 in | Wt 196.2 lb

## 2017-05-15 DIAGNOSIS — R51 Headache: Secondary | ICD-10-CM

## 2017-05-15 DIAGNOSIS — G514 Facial myokymia: Secondary | ICD-10-CM | POA: Diagnosis not present

## 2017-05-15 DIAGNOSIS — Z5181 Encounter for therapeutic drug level monitoring: Secondary | ICD-10-CM

## 2017-05-15 DIAGNOSIS — R519 Headache, unspecified: Secondary | ICD-10-CM

## 2017-05-15 DIAGNOSIS — G35 Multiple sclerosis: Secondary | ICD-10-CM

## 2017-05-15 NOTE — Progress Notes (Signed)
PATIENT: Connie Robertson DOB: [birth year]  REASON FOR VISIT: follow up- multiple sclerosis HISTORY FROM: patient  HISTORY OF PRESENT ILLNESS: Today 05/15/17 Connie Robertson is a 46 year old female with a history of multiple sclerosis. She returns today for follow-up. She remains on Tysabri. She is tolerating this well. She states that Dilantin has been beneficial for hemifacial spasms. She states that the spasms tend to be worse at night and therefore she's been taking 2 tablets of Dilantin. She denies any significant numbness or weakness. She states occasionally throughout the day her legs will become tired. She states on occasion she will have incontinence of urine. She states that this typically happens when she is unable to get to a bathroom quickly. She denies any significant changes with her vision. No changes with her gait or balance. The patient reports that she has not started Depakote. That was ordered yestreday in regards to her migraine headaches. She returns today for an evaluation.  HISTORY 11/09/16: Connie Robertson is a 47 year old right-handed black female with a history of multiple sclerosis. The patient comes into the office today as an urgent work in for evaluation of new problems that developed about 5 or 6 days ago. The patient began noting twitching of the right face, she has now also noted similar sensations on the left side of the face, she has had some slight blurring of vision, no significant visual alteration. She denies any change in speech or swallowing, she denies any new numbness or weakness of the arms or legs. She denies any change in bowel or bladder control or change in balance. 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 She returns to this office for an evaluation  REVIEW OF SYSTEMS: Out of a complete 14 system review of symptoms, the patient complains only of the following symptoms, and all other reviewed systems are negative.  See history of  present illness  ALLERGIES: No Known Allergies  HOME MEDICATIONS: Outpatient Medications Prior to Visit  Medication Sig Dispense Refill  . amitriptyline (ELAVIL) 25 MG tablet Take 2 tablets (50 mg total) by mouth at bedtime. 60 tablet 6  . amLODipine (NORVASC) 5 MG tablet Take 5 mg by mouth daily.    . benazepril (LOTENSIN) 20 MG tablet Take 20 mg by mouth daily.    . divalproex (DEPAKOTE) 500 MG DR tablet 1 tablet daily for 2 weeks then take 1 tablet twice daily 60 tablet 2  . gabapentin (NEURONTIN) 300 MG capsule Take 1 capsule (300 mg total) by mouth 2 (two) times daily. 180 capsule 3  . natalizumab (TYSABRI) 300 MG/15ML injection Inject into the vein.    Marland Kitchen oxybutynin (DITROPAN-XL) 10 MG 24 hr tablet TAKE 1 TABLET BY MOUTH TWICE DAILY 180 tablet 3  . phenytoin (DILANTIN) 100 MG ER capsule TAKE 1 CAPSULE(100 MG) BY MOUTH TWICE DAILY 60 capsule 0  . solifenacin (VESICARE) 10 MG tablet Take 1 tablet (10 mg total) by mouth daily. 90 tablet 3  . SUMAtriptan (IMITREX) 100 MG tablet Take 1 tablet (100 mg total) by mouth 2 (two) times daily as needed for migraine. 10 tablet 2  . topiramate (TOPAMAX) 50 MG tablet TAKE 4 TABLETS(200 MG) BY MOUTH AT BEDTIME 120 tablet 0  . zaleplon (SONATA) 10 MG capsule Take 1 capsule (10 mg total) by mouth at bedtime as needed for sleep. (Patient taking differently: Take 10 mg by mouth at bedtime. ) 30 capsule 0  . fluconazole (DIFLUCAN) 150 MG tablet 1  tab po x 1. May repeat in 72 hours if no improvement 2 tablet 0  . metroNIDAZOLE (FLAGYL) 500 MG tablet Take 1 tablet (500 mg total) by mouth 2 (two) times daily. 14 tablet 0  . Erenumab-aooe (AIMOVIG 140 DOSE) 70 MG/ML SOAJ Inject 140 mg into the skin every 30 (thirty) days. (Patient not taking: Reported on 05/15/2017) 1 pen 3  . valACYclovir (VALTREX) 1000 MG tablet Take 500 mg by mouth daily as needed (for outbreaks for 3 days).   1  . ALPRAZolam (XANAX) 0.5 MG tablet Take 2 tablets approximately 45 minutes prior  to the MRI study, take a third tablet if needed. (Patient not taking: Reported on 05/15/2017) 3 tablet 0  . ALPRAZolam (XANAX) 0.5 MG tablet Take 2 tablets approximately 45 minutes prior to the MRI study, take a third tablet if needed. (Patient not taking: Reported on 05/15/2017) 3 tablet 0  . cephALEXin (KEFLEX) 500 MG capsule Take 1 capsule (500 mg total) by mouth 4 (four) times daily. (Patient not taking: Reported on 05/15/2017) 28 capsule 0   No facility-administered medications prior to visit.     PAST MEDICAL HISTORY: Past Medical History:  Diagnosis Date  . Abnormality of gait 11/25/2014  . Chronic daily headache 2003   Love  . Depression   . Memory disorder 02/29/2016  . MS (multiple sclerosis) (HCC) 1992   Dr. Sandria Manly  . Multiple sclerosis (HCC) 11/21/2013  . Neurogenic bladder 11/21/2013    PAST SURGICAL HISTORY: Past Surgical History:  Procedure Laterality Date  . ABLATION COLPOCLESIS  2004  . CESAREAN SECTION    . TUBAL LIGATION      FAMILY HISTORY: Family History  Problem Relation Age of Onset  . Heart failure Father   . Multiple sclerosis Sister   . Cancer Neg Hx   . Diabetes Neg Hx   . Hyperlipidemia Neg Hx   . Hypertension Neg Hx   . Kidney disease Neg Hx   . Stroke Neg Hx     SOCIAL HISTORY: Social History   Social History  . Marital status: Divorced    Spouse name: N/A  . Number of children: 2  . Years of education: college   Occupational History  . disability    Social History Main Topics  . Smoking status: Never Smoker  . Smokeless tobacco: Never Used  . Alcohol use 1.2 oz/week    2 Standard drinks or equivalent per week  . Drug use: No  . Sexual activity: Not Currently    Birth control/ protection: Surgical   Other Topics Concern  . Not on file   Social History Narrative   Patient is right handed.   Patient drinks 2 cups of coffee daily.      PHYSICAL EXAM  Vitals:   05/15/17 0920  BP: 116/80  Pulse: 71  Weight: 196 lb 3.2 oz  (89 kg)  Height: 5' 4.5" (1.638 m)   Body mass index is 33.16 kg/m.  Generalized: Well developed, in no acute distress   Neurological examination  Mentation: Alert oriented to time, place, history taking. Follows all commands speech and language fluent Cranial nerve II-XII: Pupils were equal round reactive to light. Extraocular movements were full, visual field were full on confrontational test. Facial sensation and strength were normal. Uvula tongue midline. Head turning and shoulder shrug  were normal and symmetric. Motor: The motor testing reveals 5 over 5 strength of all 4 extremities. Good symmetric motor tone is noted throughout.  Sensory: Sensory  testing is intact to soft touch on all 4 extremities. No evidence of extinction is noted.  Coordination: Cerebellar testing reveals good finger-nose-finger and heel-to-shin bilaterally.  Gait and station: Gait is slightly wide-based. Tandem gait not attempted. Romberg is negative. Reflexes: Deep tendon reflexes are symmetric and normal bilaterally.   DIAGNOSTIC DATA (LABS, IMAGING, TESTING) - I reviewed patient records, labs, notes, testing and imaging myself where available.  Lab Results  Component Value Date   WBC 4.6 09/04/2016   HGB 11.2 09/04/2016   HCT 34.8 09/04/2016   MCV 90 09/04/2016   PLT 178 09/04/2016      Component Value Date/Time   NA 141 09/04/2016 1317   NA 142 04/05/2015 1435   K 4.4 09/04/2016 1317   K 3.9 04/05/2015 1435   CL 103 09/04/2016 1317   CO2 25 09/04/2016 1317   CO2 24 04/05/2015 1435   GLUCOSE 87 09/04/2016 1317   GLUCOSE 93 03/23/2016 1402   GLUCOSE 89 04/05/2015 1435   BUN 13 09/04/2016 1317   BUN 18.2 04/05/2015 1435   CREATININE 0.95 09/04/2016 1317   CREATININE 1.1 04/05/2015 1435   CALCIUM 9.4 09/04/2016 1317   CALCIUM 9.3 04/05/2015 1435   PROT 6.4 09/04/2016 1317   PROT 6.9 04/05/2015 1435   ALBUMIN 4.1 09/04/2016 1317   ALBUMIN 4.2 04/05/2015 1435   AST 21 09/04/2016 1317   AST  27 04/05/2015 1435   ALT 24 09/04/2016 1317   ALT 46 04/05/2015 1435   ALKPHOS 52 09/04/2016 1317   ALKPHOS 53 04/05/2015 1435   BILITOT <0.2 09/04/2016 1317   BILITOT 0.22 04/05/2015 1435   GFRNONAA 72 09/04/2016 1317   GFRAA 82 09/04/2016 1317   Lab Results  Component Value Date   CHOL 191 08/18/2011   HDL 66.10 08/18/2011   LDLCALC 103 (H) 08/18/2011   TRIG 112.0 08/18/2011   CHOLHDL 3 08/18/2011   No results found for: HGBA1C Lab Results  Component Value Date   VITAMINB12 470 04/05/2015       ASSESSMENT AND PLAN 46 y.o. year old female  has a past medical history of Abnormality of gait (11/25/2014); Chronic daily headache (2003); Depression; Memory disorder (02/29/2016); MS (multiple sclerosis) (HCC) (1992); Multiple sclerosis (HCC) (11/21/2013); and Neurogenic bladder (11/21/2013). here with:  1. Multiple sclerosis 2. Hemifacial spasms 3. Daily headache  Overall the patient is doing well. She will continue Tysabri. I will check blood work today. She had imaging in April that did not show any new lesions. The patient will continue on Dilantin for hemifacial spasms. She will start Depakote for headaches. She is advised that if her symptoms worsen or she develops new symptoms she should let us know. She will follow-up in 6 months with Dr. Anne Hahn.     Butch Penny, MSN, NP-C 05/15/2017, 9:40 AM [facility] [address], [address] [place], [place] [address] [phone]

## 2017-05-15 NOTE — Progress Notes (Signed)
I have read the note, and I agree with the clinical assessment and plan.  Siddhi Dornbush KEITH   

## 2017-05-15 NOTE — Patient Instructions (Signed)
Your Plan:  Continue Tysabri Blood work today Start Depakote If your symptoms worsen or you develop new symptoms please let us know.    Thank you for coming to see [place] at [facility]. I hope we have been able to provide you high quality care today.  You may receive a patient satisfaction survey over the next few weeks. We would appreciate your feedback and comments so that we may continue to improve ourselves and the health of our patients.

## 2017-05-16 ENCOUNTER — Telehealth: Payer: Self-pay | Admitting: *Deleted

## 2017-05-16 LAB — COMPREHENSIVE METABOLIC PANEL
A/G RATIO: 2.3 — AB (ref 1.2–2.2)
ALBUMIN: 4.4 g/dL (ref 3.5–5.5)
ALK PHOS: 73 IU/L (ref 39–117)
ALT: 17 IU/L (ref 0–32)
AST: 18 IU/L (ref 0–40)
BILIRUBIN TOTAL: 0.2 mg/dL (ref 0.0–1.2)
BUN / CREAT RATIO: 14 (ref 9–23)
BUN: 13 mg/dL (ref 6–24)
CHLORIDE: 105 mmol/L (ref 96–106)
CO2: 22 mmol/L (ref 20–29)
Calcium: 9.3 mg/dL (ref 8.7–10.2)
Creatinine, Ser: 0.95 mg/dL (ref 0.57–1.00)
GFR calc non Af Amer: 72 mL/min/{1.73_m2} (ref >59)
GFR, EST AFRICAN AMERICAN: 82 mL/min/{1.73_m2} (ref >59)
GLOBULIN, TOTAL: 1.9 g/dL (ref 1.5–4.5)
GLUCOSE: 74 mg/dL (ref 65–99)
POTASSIUM: 3.9 mmol/L (ref 3.5–5.2)
SODIUM: 140 mmol/L (ref 134–144)
TOTAL PROTEIN: 6.3 g/dL (ref 6.0–8.5)

## 2017-05-16 LAB — CBC WITH DIFFERENTIAL/PLATELET
BASOS ABS: 0 10*3/uL (ref 0.0–0.2)
Basos: 0 %
EOS (ABSOLUTE): 0.1 10*3/uL (ref 0.0–0.4)
Eos: 2 %
HEMOGLOBIN: 11.5 g/dL (ref 11.1–15.9)
Hematocrit: 34.3 % (ref 34.0–46.6)
Immature Grans (Abs): 0 10*3/uL (ref 0.0–0.1)
Immature Granulocytes: 0 %
LYMPHS ABS: 1.8 10*3/uL (ref 0.7–3.1)
Lymphs: 36 %
MCH: 30.6 pg (ref 26.6–33.0)
MCHC: 33.5 g/dL (ref 31.5–35.7)
MCV: 91 fL (ref 79–97)
MONOCYTES: 8 %
MONOS ABS: 0.4 10*3/uL (ref 0.1–0.9)
NEUTROS ABS: 2.7 10*3/uL (ref 1.4–7.0)
Neutrophils: 54 %
Platelets: 200 10*3/uL (ref 150–379)
RBC: 3.76 x10E6/uL — AB (ref 3.77–5.28)
RDW: 14.7 % (ref 12.3–15.4)
WBC: 5 10*3/uL (ref 3.4–10.8)

## 2017-05-16 NOTE — Telephone Encounter (Signed)
Spoke with patient and informed her blood work is relatively unremarkable.  Advised she will get a call when her JCV lab result is back. She stated she has felt more tired lately; her next infusion is in 2 weeks. This RN advised she call if it worsens or she develops other problems. She verbalized understanding, appreciation.

## 2017-05-21 ENCOUNTER — Telehealth: Payer: Self-pay | Admitting: *Deleted

## 2017-05-21 NOTE — Telephone Encounter (Signed)
Received fax from [facility] and the JCV is negative with index 0.12.

## 2017-05-21 NOTE — Telephone Encounter (Signed)
Please call patient with results.

## 2017-05-21 NOTE — Telephone Encounter (Signed)
Spoke with pt and relayed that her JCV was negative.  She verbalized understanding.

## 2017-05-30 NOTE — Telephone Encounter (Signed)
That's right

## 2017-05-30 NOTE — Telephone Encounter (Signed)
I called patient. She has ongoing been on Depakote for 2 weeks, she needs to be on the medication a bit longer before this is counted as a failure.  She may get a flu shot with a preparation that does not involve a live attenuated virus.

## 2017-05-30 NOTE — Telephone Encounter (Addendum)
Pt called said divalproex (DEPAKOTE) 500 MG DR tablet is not helping, she started it on 05/14/17 and has increased to 2 tabs bid. She is wanting to know if aimovig was approved. Also can she have the flu shot, PCP is wanting her to get it. Please call

## 2017-05-30 NOTE — Telephone Encounter (Signed)
On tysabri, can have flu shot 2 weeks post tysabri per MM/NP.  Will check with Dr. Anne HahnWillis and try ajovy?

## 2017-05-31 ENCOUNTER — Telehealth: Payer: Self-pay

## 2017-05-31 ENCOUNTER — Other Ambulatory Visit: Payer: Self-pay | Admitting: Neurology

## 2017-05-31 NOTE — Telephone Encounter (Signed)
Made in error

## 2017-06-13 ENCOUNTER — Other Ambulatory Visit: Payer: Self-pay | Admitting: Obstetrics & Gynecology

## 2017-06-13 ENCOUNTER — Other Ambulatory Visit: Payer: Self-pay | Admitting: Neurology

## 2017-06-13 DIAGNOSIS — Z139 Encounter for screening, unspecified: Secondary | ICD-10-CM

## 2017-06-21 ENCOUNTER — Telehealth: Payer: Self-pay | Admitting: Neurology

## 2017-06-21 NOTE — Telephone Encounter (Signed)
Pt called she said the company that is coming to her home is requesting documentation that she needs assistance at home. Pt does not remember the name of the company but will get that information so she can give to RN when call is returned.  Please call to discuss

## 2017-06-22 NOTE — Telephone Encounter (Addendum)
Called patient back. States she receives in home help during the day. Services through [employer]. She does not remember the company name. Bridgett is who comes to help out.   She is going to call back with company name. She is requesting something to show proof that she needs help in the home.

## 2017-06-25 ENCOUNTER — Encounter: Payer: Self-pay | Admitting: Neurology

## 2017-06-25 NOTE — Telephone Encounter (Signed)
Called patient since I have not received returned call back regarding company name. She states an Connie Robertson is in contact with her who is over her insurance to make sure everything is documented correctly to show need for help in the home. The company name is [employer].

## 2017-06-25 NOTE — Telephone Encounter (Signed)
The patient is apparently having aid and assistance coming out to her house.  This apparently was not ordered to a physician, she had a friend that has a services agency who indicated that she should apply for help in the home environment.  The patient has multiple sclerosis.  She does have a gait disturbance, she is able to ambulate independently, sometimes she will use a cane.  She is able to operate a motor vehicle, she does not drive much, but she is able to.  I indicated that I was not sure that her medical insurance would pay for aid and assistance in the home environment.  She claims that she needs help getting in and out of the bathtub, and she needs help picking things up off the floor and cleaning up about the house.  I will write a letter concerning these issues, but once again I am not sure that the insurance will actually pay for this.

## 2017-06-26 NOTE — Telephone Encounter (Signed)
Called pt, advised letter ready for pick up. She will come by office to pick up.

## 2017-06-29 ENCOUNTER — Other Ambulatory Visit: Payer: Self-pay | Admitting: Neurology

## 2017-07-02 ENCOUNTER — Other Ambulatory Visit: Payer: Self-pay | Admitting: *Deleted

## 2017-07-02 MED ORDER — SOLIFENACIN SUCCINATE 10 MG PO TABS: 10.0000 mg | ORAL_TABLET | Freq: Every day | ORAL | 0 refills | Status: DC

## 2017-07-02 MED ORDER — GABAPENTIN 300 MG PO CAPS: 300.0000 mg | ORAL_CAPSULE | Freq: Two times a day (BID) | ORAL | 0 refills | Status: DC

## 2017-07-04 ENCOUNTER — Telehealth: Payer: Self-pay | Admitting: Neurology

## 2017-07-04 NOTE — Telephone Encounter (Addendum)
Called pt back. Connie Robertson is requesting a 3051 form to be filled out to show need for health care nurse that she has come to help in the home. She is unable to clarify what this form is. Reminded her of letter that Dr. Anne Hahn wrote. She will come by to pick up letter.  Advised I will send message to Dr. Anne Hahn to see if he can fill this form out. We will call back and let her know.

## 2017-07-04 NOTE — Telephone Encounter (Signed)
Patient is calling to discuss getting an order for 3051. She said she is not sure what this is but the nurse will know. It is regarding assistance for MS.

## 2017-07-04 NOTE — Telephone Encounter (Signed)
The form has been filled out.

## 2017-07-05 NOTE — Telephone Encounter (Signed)
Called patient to let her know 3051 form ready for pick up. Did advise patient there is a 50 dollar fee before she can receive forms. She is aware and will come by to get form at her convenience.  Sent copy to medical records to be scanned into Epic.

## 2017-07-13 ENCOUNTER — Ambulatory Visit: Payer: Medicare Other

## 2017-07-26 LAB — HM PAP SMEAR: HM Pap smear: ABNORMAL

## 2017-08-09 DIAGNOSIS — Z1231 Encounter for screening mammogram for malignant neoplasm of breast: Secondary | ICD-10-CM | POA: Diagnosis not present

## 2017-08-09 LAB — HM MAMMOGRAPHY

## 2017-08-17 ENCOUNTER — Encounter: Payer: Self-pay | Admitting: Physician Assistant

## 2017-08-17 ENCOUNTER — Ambulatory Visit (INDEPENDENT_AMBULATORY_CARE_PROVIDER_SITE_OTHER): Payer: Medicare HMO | Admitting: Physician Assistant

## 2017-08-17 ENCOUNTER — Other Ambulatory Visit: Payer: Self-pay

## 2017-08-17 VITALS — BP 118/78 | HR 65 | Temp 98.4°F | Resp 14 | Ht 65.0 in | Wt 193.0 lb

## 2017-08-17 DIAGNOSIS — Z113 Encounter for screening for infections with a predominantly sexual mode of transmission: Secondary | ICD-10-CM

## 2017-08-17 DIAGNOSIS — G35 Multiple sclerosis: Secondary | ICD-10-CM | POA: Diagnosis not present

## 2017-08-17 DIAGNOSIS — G44229 Chronic tension-type headache, not intractable: Secondary | ICD-10-CM

## 2017-08-17 NOTE — Progress Notes (Signed)
Patient presents to clinic today to establish care.  Acute Concerns: Patient currently on Valtrex 1000 mg daily x 5 years for HSV outbreak prevention. Endorses diagnosis 5 years ago on routine STI screen after finding out now ex-husband had been unfaithful. 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 States she has never had an outbreak or symptoms of HSV 2. Denies cold sore. Is wondering if she requires daily medication.  Chronic Issues: Multiple Sclerosis -- Diagnosed at age 69. Followed by Neurology, Dr. Anne Hahn. Is currently on once monthly infusions and tolerating well.  No exacerbation or new symptoms noted since last checkup with neurology.  Chronic Migraine -- Followed by Neurology. Currently on Topamax 200 mg. Is taking as directed. Still noting daily headache. Has been on numerous medication regimen including Botox that was ineffective. Is wanting to discuss second opinion for treatment.  Health Maintenance: Immunizations -- Tetanus up-to-date. Declines flu shot. PAP -- Recent PAP + HPV. Followed by Dr. Arlyce Dice. Has follow-up scheduled this coming week. She is to discuss mammography with them.  Past Medical History:  Diagnosis Date  . Abnormality of gait 11/25/2014  . Chronic daily headache 2003   Love  . Depression   . Memory disorder 02/29/2016  . MS (multiple sclerosis) (HCC) 1992   Dr. Sandria Manly  . Multiple sclerosis (HCC) 11/21/2013  . Neurogenic bladder 11/21/2013    Past Surgical History:  Procedure Laterality Date  . ABLATION COLPOCLESIS  2004  . CESAREAN SECTION    . TUBAL LIGATION      Current Outpatient Medications on File Prior to Visit  Medication Sig Dispense Refill  . lubiprostone (AMITIZA) 24 MCG capsule Amitiza 24 mcg capsule  TK 1 C PO BID    . natalizumab (TYSABRI) 300 MG/15ML injection Tysabri 300 mg/15 mL intravenous solution    . oxybutynin (DITROPAN-XL) 10 MG 24 hr tablet TAKE 1 TABLET BY MOUTH TWICE DAILY 180 tablet 3  . phentermine 37.5 MG capsule  phentermine 37.5 mg capsule  TK 1 C PO ONCE D IN THE MORNING    . topiramate (TOPAMAX) 50 MG tablet TAKE 4 TABLETS(200 MG) BY MOUTH AT BEDTIME 120 tablet 5  . valACYclovir (VALTREX) 1000 MG tablet valacyclovir 1 gram tablet  TK 1 T PO D    . gabapentin (NEURONTIN) 300 MG capsule Take 1 capsule (300 mg total) by mouth 2 (two) times daily. (Patient not taking: Reported on 08/17/2017) 180 capsule 0  . [DISCONTINUED] nebivolol (BYSTOLIC) 10 MG tablet Take 1 tablet (10 mg total) by mouth daily. 90 tablet 0   No current facility-administered medications on file prior to visit.     No Known Allergies  Family History  Problem Relation Age of Onset  . Heart failure Father   . Multiple sclerosis Sister   . Cancer Neg Hx   . Diabetes Neg Hx   . Hyperlipidemia Neg Hx   . Hypertension Neg Hx   . Kidney disease Neg Hx   . Stroke Neg Hx     Social History   Socioeconomic History  . Marital status: Divorced    Spouse name: Not on file  . Number of children: 2  . Years of education: college  . Highest education level: Not on file  Social Needs  . Financial resource strain: Not on file  . Food insecurity - worry: Not on file  . Food insecurity - inability: Not on file  . Transportation needs - medical: Not on file  . Transportation needs -  non-medical: Not on file  Occupational History  . Occupation: disability  Tobacco Use  . Smoking status: Never Smoker  . Smokeless tobacco: Never Used  Substance and Sexual Activity  . Alcohol use: Yes    Frequency: Never    Comment: social  . Drug use: No  . Sexual activity: Yes    Birth control/protection: Surgical    Comment: Tubal  Other Topics Concern  . Not on file  Social History Narrative   Patient is right handed.   Patient drinks 2 cups of coffee daily.   Review of Systems  Constitutional: Negative for fever.  Eyes: Negative for blurred vision and double vision.  Respiratory: Negative for cough and shortness of breath.     Cardiovascular: Negative for chest pain and palpitations.  Neurological: Positive for headaches (chronic).  Psychiatric/Behavioral: Negative for depression, hallucinations, substance abuse and suicidal ideas. The patient is not nervous/anxious and does not have insomnia.    BP 118/78   Pulse 65   Temp 98.4 F (36.9 C) (Oral)   Resp 14   Ht 5\' 5"  (1.651 m)   Wt 193 lb (87.5 kg)   SpO2 97%   BMI 32.12 kg/m   Physical Exam  Constitutional: She is oriented to person, place, and time and well-developed, well-nourished, and in no distress.  HENT:  Head: Normocephalic and atraumatic.  Eyes: Conjunctivae and EOM are normal. Pupils are equal, round, and reactive to light.  Neck: Neck supple.  Cardiovascular: Normal rate, regular rhythm, normal heart sounds and intact distal pulses.  Pulmonary/Chest: Effort normal and breath sounds normal. No respiratory distress. She has no wheezes. She has no rales. She exhibits no tenderness.  Neurological: She is alert and oriented to person, place, and time.  Skin: Skin is warm and dry. No rash noted.  Vitals reviewed.  Assessment/Plan: 1. Screen for STD (sexually transmitted disease) Repeat HSV panel as patient asymptomatic and always has been in case of prior false positive testing. If negative will stop medication. If positive, will discuss weaning down to 500 mg daily for a trial period. - HSV(herpes smplx)abs-1+2(IgG+IgM)-bld  2. Multiple sclerosis (HCC) Continue care and follow-up as directed by Neurology.  3. Chronic tension-type headache, not intractable Will refer to [facility] for second opinion regarding treatment of her chronic headaches.    Piedad Climes, PA-C

## 2017-08-17 NOTE — Patient Instructions (Signed)
Please go to the lab today for blood work.  I will call you with your results. We will alter treatment regimen(s) if indicated by your results.   I am setting you up with a second opinion for headaches.  I am working on getting you in with a specialist at [facility] which is an academic center.  Please schedule CPE your earliest convenience.

## 2017-08-20 ENCOUNTER — Telehealth: Payer: Self-pay | Admitting: Physician Assistant

## 2017-08-20 ENCOUNTER — Other Ambulatory Visit: Payer: Self-pay | Admitting: Physician Assistant

## 2017-08-20 MED ORDER — VALACYCLOVIR HCL 500 MG PO TABS: 500.0000 mg | ORAL_TABLET | Freq: Every day | ORAL | 6 refills | Status: DC

## 2017-08-20 NOTE — Telephone Encounter (Signed)
Spoke with patient and she doesn't need a referral from [place] for Dr Anne Hahn office. She is waiting on insurance to approve her infusions.

## 2017-08-20 NOTE — Telephone Encounter (Signed)
Copied from CRM [id number]. Topic: Referral - Request >> Aug 20, 2017  1:19 PM Raquel Sarna wrote: Referral needed for an infusion procedure w/ Dr. Anne Hahn at [facility]

## 2017-08-21 LAB — HSV(HERPES SMPLX)ABS-I+II(IGG+IGM)-BLD
HSV 1 Glycoprotein G Ab, IgG: 6.15 index — ABNORMAL HIGH (ref 0.00–0.90)
HSV 2 IGG, TYPE SPEC: 3.75 {index} — AB (ref 0.00–0.90)
HSVI/II Comb IgM: 1.14 Ratio — ABNORMAL HIGH (ref 0.00–0.90)

## 2017-08-21 LAB — HSV-2 IGG SUPPLEMENTAL TEST: HSV-2 IGG SUPPLEMENTAL TEST: POSITIVE — AB

## 2017-08-23 ENCOUNTER — Telehealth: Payer: Self-pay | Admitting: Neurology

## 2017-08-23 DIAGNOSIS — N879 Dysplasia of cervix uteri, unspecified: Secondary | ICD-10-CM | POA: Diagnosis not present

## 2017-08-23 MED ORDER — PREDNISONE 10 MG PO TABS
ORAL_TABLET | ORAL | 0 refills | Status: DC
Start: 2017-08-23 — End: 2017-09-24

## 2017-08-23 NOTE — Telephone Encounter (Signed)
Pt is needing a call regarding a medication to hold her over until pt can get her next Botox. Pt wasn't sure of the name of medication. Please call to discuss further

## 2017-08-23 NOTE — Telephone Encounter (Signed)
I spoke to pt and she is calling about her Connie Robertson (which she now has a new Consulting civil engineer) and they are trying to get authorization.  She states that her tysabri was due last week.  [facility] is working on this.  She is having some sx of bilateral leg weakness (pt states jello ee feeling), some blurry vision, fatigue, memory issues and daily headaches. She feels that she may not be in exacerbation yet, but due to the weekend she has planned (busy driving from [place], multiple times and daughter's activity) that she may go into one and wants to stop it before it happens. She does not recall trying the depakote for her headaches.  She may call pharmacy.   I told her that would send message to Dr. Anne Hahn. She has an appt at 1600 but will be available by mobile.

## 2017-08-23 NOTE — Telephone Encounter (Signed)
I called the patient.  The patient indicates that her Tysabri dose for this month has been delayed because of requirements for prior authorization.  She missed the dose in December, her last dose was in November.  The patient is at high risk for MS exacerbation for this reason.  She has noted over the last 2 days of some blurring of vision and some difficulty with walking.  I will get her on some prednisone and see if we can expedite her prior authorization for her Tysabri.

## 2017-08-23 NOTE — Telephone Encounter (Signed)
I LMVM for pt that returning her call.  I do not see that we give her botox.

## 2017-08-23 NOTE — Addendum Note (Signed)
Addended by: York Spaniel on: 08/23/2017 05:05 PM   Modules accepted: Orders

## 2017-08-24 ENCOUNTER — Telehealth: Payer: Self-pay | Admitting: Adult Health

## 2017-08-24 DIAGNOSIS — N879 Dysplasia of cervix uteri, unspecified: Secondary | ICD-10-CM | POA: Diagnosis not present

## 2017-08-24 DIAGNOSIS — I10 Essential (primary) hypertension: Secondary | ICD-10-CM | POA: Diagnosis not present

## 2017-08-24 DIAGNOSIS — N72 Inflammatory disease of cervix uteri: Secondary | ICD-10-CM | POA: Diagnosis not present

## 2017-08-24 NOTE — Telephone Encounter (Signed)
Spoke to pt and relayed that IV is quicker, but once taking oral medication ( in stomach to dissolve) will get effect and continue as per Dr. Anne Hahn instruction. I also went over the instructions on how to take the prednisone as per order.   She verbalized understanding.   Tina from [facility] was calling pt, so this call was ended.

## 2017-08-24 NOTE — Telephone Encounter (Signed)
Pt states that Dr Anne Hahn called in a steroid for her and she is on her way to get it, she would like a call back to know if the pill will work quicker than an infusion.  Please call

## 2017-08-28 DIAGNOSIS — R635 Abnormal weight gain: Secondary | ICD-10-CM | POA: Diagnosis not present

## 2017-09-20 ENCOUNTER — Ambulatory Visit: Payer: Medicare HMO | Admitting: Physician Assistant

## 2017-09-21 ENCOUNTER — Ambulatory Visit: Payer: Medicare HMO | Admitting: Physician Assistant

## 2017-09-24 ENCOUNTER — Telehealth: Payer: Self-pay | Admitting: *Deleted

## 2017-09-24 ENCOUNTER — Ambulatory Visit (INDEPENDENT_AMBULATORY_CARE_PROVIDER_SITE_OTHER): Payer: Medicare HMO | Admitting: Physician Assistant

## 2017-09-24 ENCOUNTER — Ambulatory Visit (INDEPENDENT_AMBULATORY_CARE_PROVIDER_SITE_OTHER): Payer: Medicare HMO

## 2017-09-24 ENCOUNTER — Other Ambulatory Visit: Payer: Self-pay

## 2017-09-24 ENCOUNTER — Encounter: Payer: Self-pay | Admitting: Physician Assistant

## 2017-09-24 VITALS — BP 110/84 | HR 67 | Temp 97.9°F | Resp 16 | Ht 65.0 in | Wt 193.0 lb

## 2017-09-24 DIAGNOSIS — W19XXXA Unspecified fall, initial encounter: Secondary | ICD-10-CM

## 2017-09-24 DIAGNOSIS — M545 Low back pain: Secondary | ICD-10-CM | POA: Diagnosis not present

## 2017-09-24 DIAGNOSIS — S3992XA Unspecified injury of lower back, initial encounter: Secondary | ICD-10-CM | POA: Diagnosis not present

## 2017-09-24 MED ORDER — BACLOFEN 10 MG PO TABS: 10.0000 mg | ORAL_TABLET | Freq: Every day | ORAL | 0 refills | Status: DC

## 2017-09-24 MED ORDER — MELOXICAM 7.5 MG PO TABS: 7.5000 mg | ORAL_TABLET | Freq: Every day | ORAL | 0 refills | Status: DC

## 2017-09-24 NOTE — Telephone Encounter (Signed)
Faxed completed/signed tysabri re-auth form to [place] touch prescribing program at [phone]. Received fax confirmation. 

## 2017-09-24 NOTE — Patient Instructions (Signed)
Please speak with the ladies up front for directions for x-ray. I will call with results.  Avoid heavy lifting or overexertion. Stop the Ibuprofen. Start the Meloxicam once daily over the next week.  Take the Baclofen as directed.  Increase Gabapentin to 1 capsule twice daily.

## 2017-09-24 NOTE — Progress Notes (Signed)
Patient presents to clinic today c/o pain in lumbar spine s/p fall on 09/15/17. States she tripped on a rug on a set of stairs at an [employer]. Fell backwards onto her back (down 2 steps max). Notes twisting during the fall. Denies any head trauma, headache or LOC. Has taken some Ibuprofen for pain with some relief. Pain is worse with ROM. Some radiation into RLE on occasion with walking. Denies bruising. Denies numbness or tingling of extremities. Denies saddle anesthesia or change to bowel/bladder habits.  Past Medical History:  Diagnosis Date  . Abnormality of gait 11/25/2014  . Chronic daily headache 2003   Love  . Depression   . Memory disorder 02/29/2016  . MS (multiple sclerosis) (HCC) 1992   Dr. Sandria Manly  . Multiple sclerosis (HCC) 11/21/2013  . Neurogenic bladder 11/21/2013    Current Outpatient Medications on File Prior to Visit  Medication Sig Dispense Refill  . gabapentin (NEURONTIN) 300 MG capsule Take 1 capsule (300 mg total) by mouth 2 (two) times daily. 180 capsule 0  . natalizumab (TYSABRI) 300 MG/15ML injection Tysabri 300 mg/15 mL intravenous solution    . oxybutynin (DITROPAN-XL) 10 MG 24 hr tablet TAKE 1 TABLET BY MOUTH TWICE DAILY 180 tablet 3  . phentermine 37.5 MG capsule phentermine 37.5 mg capsule  TK 1 C PO ONCE D IN THE MORNING    . phenytoin (DILANTIN) 100 MG ER capsule Take 1 capsule by mouth daily.  5  . topiramate (TOPAMAX) 50 MG tablet TAKE 4 TABLETS(200 MG) BY MOUTH AT BEDTIME 120 tablet 5  . valACYclovir (VALTREX) 500 MG tablet Take 1 tablet (500 mg total) by mouth daily. 30 tablet 6  . lubiprostone (AMITIZA) 24 MCG capsule Amitiza 24 mcg capsule  TK 1 C PO BID    . [DISCONTINUED] nebivolol (BYSTOLIC) 10 MG tablet Take 1 tablet (10 mg total) by mouth daily. 90 tablet 0   No current facility-administered medications on file prior to visit.     No Known Allergies  Family History  Problem Relation Age of Onset  . Heart failure Father   . Multiple  sclerosis Sister   . Cancer Neg Hx   . Diabetes Neg Hx   . Hyperlipidemia Neg Hx   . Hypertension Neg Hx   . Kidney disease Neg Hx   . Stroke Neg Hx     Social History   Socioeconomic History  . Marital status: Divorced    Spouse name: None  . Number of children: 2  . Years of education: college  . Highest education level: None  Social Needs  . Financial resource strain: None  . Food insecurity - worry: None  . Food insecurity - inability: None  . Transportation needs - medical: None  . Transportation needs - non-medical: None  Occupational History  . Occupation: disability  Tobacco Use  . Smoking status: Never Smoker  . Smokeless tobacco: Never Used  Substance and Sexual Activity  . Alcohol use: Yes    Frequency: Never    Comment: social  . Drug use: No  . Sexual activity: Yes    Birth control/protection: Surgical    Comment: Tubal  Other Topics Concern  . None  Social History Narrative   Patient is right handed.   Patient drinks 2 cups of coffee daily.   Review of Systems - See HPI.  All other ROS are negative.  BP 110/84   Pulse 67   Temp 97.9 F (36.6 C) (Oral)   Resp  16   Ht 5\' 5"  (1.651 m)   Wt 193 lb (87.5 kg)   SpO2 97%   BMI 32.12 kg/m   Physical Exam  Constitutional: She is oriented to person, place, and time and well-developed, well-nourished, and in no distress.  HENT:  Head: Normocephalic and atraumatic.  Eyes: Conjunctivae are normal.  Cardiovascular: Normal rate, regular rhythm, normal heart sounds and intact distal pulses.  Pulmonary/Chest: Effort normal and breath sounds normal. No respiratory distress. She has no wheezes. She has no rales. She exhibits no tenderness.  Musculoskeletal:       Thoracic back: She exhibits tenderness (+ Right-sided perispinal tenderness). She exhibits normal range of motion and no bony tenderness.       Lumbar back: She exhibits tenderness and bony tenderness. She exhibits no spasm.  Neurological: She is  alert and oriented to person, place, and time.  Skin: Skin is warm and dry. No rash noted.  Psychiatric: Affect normal.  Vitals reviewed.  Recent Results (from the past 2160 hour(s))  HM PAP SMEAR     Status: None   Collection Time: 07/26/17 12:00 AM  Result Value Ref Range   HM Pap smear abnormal   HM MAMMOGRAPHY     Status: None   Collection Time: 08/09/17 12:00 AM  Result Value Ref Range   HM Mammogram 0-4 Bi-Rad 0-4 Bi-Rad, Self Reported Normal  HSV(herpes smplx)abs-1+2(IgG+IgM)-bld     Status: Abnormal   Collection Time: 08/17/17  2:30 PM  Result Value Ref Range   HSVI/II Comb IgM 1.14 (H) 0.00 - 0.90 Ratio    Comment:                                  Negative        <0.91                                  Equivocal 0.91 - 1.09                                  Positive        >1.09    HSV 1 Glycoprotein G Ab, IgG 6.15 (H) 0.00 - 0.90 index    Comment:                                  Negative        <0.91                                  Equivocal 0.91 - 1.09                                  Positive        >1.09  Note: Negative indicates no antibodies detected to  HSV-1. Equivocal may suggest early infection.  If  clinically appropriate, retest at later date. Positive  indicates antibodies detected to HSV-1.    HSV 2 IgG, Type Spec 3.75 (H) 0.00 - 0.90 index    Comment:  Negative        <0.91                                  Equivocal 0.91 - 1.09                                  Positive        >1.09  Note: Negative indicates no antibodies detected to  HSV-2. Equivocal may suggest early infection.  If  clinically appropriate, retest at later date. Positive  indicates antibodies detected to HSV-2.   HSV-2 IgG Supplemental Test     Status: Abnormal   Collection Time: 08/17/17  2:30 PM  Result Value Ref Range   HSV-2 IgG Supplemental Test Positive (A) Negative    Comment:  HSV-2 IgG          HSV-2 IgG Type Specific      Confirmation       Interpretation ------------------------------------------------------- Positive/Equivocal   Positive    Indicates the presence                                  of detectable IgG                                  antibodies to HSV-2. ------------------------------------------------------- Positive/Equivocal   Negative    Unable to confirm the                                  presence of IgG                                  antibodies to HSV-2.                                  Recommend retesting                                  in 2-4 weeks. -------------------------------------------------------    Assessment/Plan: 1. Fall with injury, initial encounter Some mild point tenderness of lumbar spine. Will obtain x-ray to r/o fracture. There is MSK strain and spasm. Start Mobic, rest, Baclofen. Will alter regimen according to imaging results.   - DG Lumbar Spine Complete; Future - meloxicam (MOBIC) 7.5 MG tablet; Take 1 tablet (7.5 mg total) by mouth daily.  Dispense: 15 tablet; Refill: 0 - baclofen (LIORESAL) 10 MG tablet; Take 1 tablet (10 mg total) by mouth at bedtime.  Dispense: 15 each; Refill: 0   Piedad Climes, PA-C

## 2017-09-24 NOTE — Telephone Encounter (Signed)
Received fax notification from [place] touch prescribing program that patient authorized from 10/20/17-04/20/18. Patient enrollment number: [id number]. Account: [employer], site auth number: [id number].

## 2017-09-25 ENCOUNTER — Telehealth: Payer: Self-pay | Admitting: Adult Health

## 2017-09-25 DIAGNOSIS — N319 Neuromuscular dysfunction of bladder, unspecified: Secondary | ICD-10-CM

## 2017-09-25 NOTE — Telephone Encounter (Signed)
Pt taking ditropan XL 10mg  (2 tablets in am and 2 tablets in pm) for the last 2-3 weeks. (this on her own accord).   I see from 09/2016 that she has been told to take 10mg  daily, and I note previous messages relating to referral to urology.  She continues with leakage even with taking the increased dose. Attempt another referral?

## 2017-09-25 NOTE — Telephone Encounter (Signed)
I called the patient.  The patient has had increasing problems with urgency and incontinence with the bladder, she has doubled up on her Ditropan taking 20 mg twice daily.  I have indicated that she should cut back to the recommended dose of 10 mg twice daily.  The patient will have a referral to a urology physician.

## 2017-09-25 NOTE — Telephone Encounter (Signed)
Pt is taking pills for bladder but its not helping. Pt still having issues, she is asking for a call concerning her medication for this condition

## 2017-09-25 NOTE — Addendum Note (Signed)
Addended by: York Spaniel on: 09/25/2017 04:35 PM   Modules accepted: Orders

## 2017-09-28 DIAGNOSIS — R635 Abnormal weight gain: Secondary | ICD-10-CM | POA: Diagnosis not present

## 2017-10-02 NOTE — Telephone Encounter (Signed)
Gave notice to [facility].

## 2017-10-02 NOTE — Telephone Encounter (Signed)
Elana/[employer] [phone] REF #[id number] to advise the medication has been partially approved. FYI

## 2017-10-03 NOTE — Telephone Encounter (Signed)
Error

## 2017-10-06 ENCOUNTER — Other Ambulatory Visit: Payer: Self-pay | Admitting: Neurology

## 2017-11-07 ENCOUNTER — Telehealth: Payer: Self-pay | Admitting: Adult Health

## 2017-11-07 NOTE — Telephone Encounter (Signed)
FYI Pt is wanting Dr Anne Hahn and his RN to be on the look out for a fax re: a request for pt to have Home health RN to come to her home for Balance issues and head aches she is having.  The request for Home health is for help around her home that she is having difficulty doing things for her self. Pt states this is a request Dr Anne Hahn has signed off on for her back in 2017.  Pt is not asking for a call back, she just wants RN and Dr Anne Hahn to be on the look out for the document. Pt thinks it is coming from a place with [employer] in the [place]

## 2017-11-07 NOTE — Telephone Encounter (Signed)
Called pt. Advised we have not received any fax yet. Asked her to have them re-fax to [phone]. She will call back and have them re-send.

## 2017-11-07 NOTE — Telephone Encounter (Signed)
Noted, will look out for the fax

## 2017-11-08 NOTE — Telephone Encounter (Signed)
I called pt back. Advised we received 3051 forms but this was what we filled out for her back on 07/04/17. She does not recall this. She will pick up another copy and contact who is requesting forms to see if original request was denied/get more info. Nothing further needed at this time. I placed copy of forms filled out 07/04/17 up front for pt pick up.

## 2017-11-14 ENCOUNTER — Encounter: Payer: Self-pay | Admitting: Neurology

## 2017-11-14 ENCOUNTER — Ambulatory Visit (INDEPENDENT_AMBULATORY_CARE_PROVIDER_SITE_OTHER): Payer: Medicare Other | Admitting: Neurology

## 2017-11-14 ENCOUNTER — Telehealth: Payer: Self-pay | Admitting: Neurology

## 2017-11-14 VITALS — BP 122/80 | HR 71 | Ht 65.0 in | Wt 191.0 lb

## 2017-11-14 DIAGNOSIS — N319 Neuromuscular dysfunction of bladder, unspecified: Secondary | ICD-10-CM | POA: Diagnosis not present

## 2017-11-14 DIAGNOSIS — Z5181 Encounter for therapeutic drug level monitoring: Secondary | ICD-10-CM

## 2017-11-14 DIAGNOSIS — R413 Other amnesia: Secondary | ICD-10-CM | POA: Diagnosis not present

## 2017-11-14 DIAGNOSIS — G35 Multiple sclerosis: Secondary | ICD-10-CM | POA: Diagnosis not present

## 2017-11-14 DIAGNOSIS — R269 Unspecified abnormalities of gait and mobility: Secondary | ICD-10-CM

## 2017-11-14 MED ORDER — ALPRAZOLAM 0.5 MG PO TABS: ORAL_TABLET | ORAL | 0 refills | Status: DC

## 2017-11-14 MED ORDER — ERENUMAB-AOOE 70 MG/ML ~~LOC~~ SOAJ: 140.0000 mg | SUBCUTANEOUS | 4 refills | Status: DC

## 2017-11-14 NOTE — Telephone Encounter (Signed)
[employer] Medicare/medicaid order sent to [facility] they will eracho ut to the pt to schedule.

## 2017-11-14 NOTE — Telephone Encounter (Signed)
Pt has requested to be called to discuss her migraines that are continuing to increase.

## 2017-11-14 NOTE — Addendum Note (Signed)
Addended by: York Spaniel on: 11/14/2017 02:49 PM   Modules accepted: Orders

## 2017-11-14 NOTE — Telephone Encounter (Signed)
I called the patient.  The patient is still having daily headaches, she has gotten Botox therapy previously which was not effective.  She was given a trial on Depakote back in October 2018, this did not help either.  I will give her trial on Aimovig.  She forgot to mention anything about her headaches when she was in the office today.

## 2017-11-14 NOTE — Progress Notes (Signed)
Placed [facility] in [facility] lock box for routine pick up. 

## 2017-11-14 NOTE — Patient Instructions (Signed)
   We will get MRI of the brain and get a urology referral for the bladder.

## 2017-11-14 NOTE — Progress Notes (Signed)
Reason for visit: Multiple sclerosis  Connie Robertson is an 47 y.o. female  History of present illness:  Connie Robertson is a 47 year old right-handed black female with a history of multiple sclerosis associated with mild memory disturbance and a gait disorder.  The patient also has a neurogenic bladder from this.  She was referred to urology and February 2019 secondary to worsening problems with bladder function.  The patient claims that she never heard about the appointment, but she goes on to say that she is not very good at checking her messages on her telephone.  The patient also was to have an MRI of the brain last year but never had this done.  Once again, the patient never heard about scheduling for this study.  The patient is on Tysabri, she is tolerating the medication well.  She returns for reevaluation.  The patient reports some slight increase in tingling in the feet bilaterally, she has had some blurring of vision, no loss of vision.  She does have issues with urinary incontinence.  The patient usually uses a cane for ambulation, she forgot to bring the cane in today to the office.  The patient has fallen on occasion.  Past Medical History:  Diagnosis Date  . Abnormality of gait 11/25/2014  . Chronic daily headache 2003   Love  . Depression   . Memory disorder 02/29/2016  . MS (multiple sclerosis) (HCC) 1992   Dr. Sandria Manly  . Multiple sclerosis (HCC) 11/21/2013  . Neurogenic bladder 11/21/2013    Past Surgical History:  Procedure Laterality Date  . ABLATION COLPOCLESIS  2004  . CESAREAN SECTION    . TUBAL LIGATION      Family History  Problem Relation Age of Onset  . Heart failure Father   . Multiple sclerosis Sister   . Cancer Neg Hx   . Diabetes Neg Hx   . Hyperlipidemia Neg Hx   . Hypertension Neg Hx   . Kidney disease Neg Hx   . Stroke Neg Hx     Social history:  reports that she has never smoked. She has never used smokeless tobacco. She reports that she drinks  alcohol. She reports that she does not use drugs.   No Known Allergies  Medications:  Prior to Admission medications   Medication Sig Start Date End Date Taking? Authorizing Provider  baclofen (LIORESAL) 10 MG tablet Take 1 tablet (10 mg total) by mouth at bedtime. 09/24/17  Yes Waldon Merl, PA-C  gabapentin (NEURONTIN) 300 MG capsule TAKE 1 CAPSULE(300 MG) BY MOUTH TWICE DAILY 10/08/17  Yes York Spaniel, MD  lubiprostone (AMITIZA) 24 MCG capsule Amitiza 24 mcg capsule  TK 1 C PO BID   Yes [provider]  meloxicam (MOBIC) 7.5 MG tablet Take 1 tablet (7.5 mg total) by mouth daily. 09/24/17  Yes Waldon Merl, PA-C  natalizumab (TYSABRI) 300 MG/15ML injection Tysabri 300 mg/15 mL intravenous solution   Yes [provider]  oxybutynin (DITROPAN-XL) 10 MG 24 hr tablet TAKE 1 TABLET BY MOUTH TWICE DAILY 03/28/17  Yes York Spaniel, MD  phentermine 37.5 MG capsule phentermine 37.5 mg capsule  TK 1 C PO ONCE D IN THE MORNING   Yes [provider]  phenytoin (DILANTIN) 100 MG ER capsule Take 1 capsule by mouth daily. 08/25/17  Yes [provider]  topiramate (TOPAMAX) 50 MG tablet TAKE 4 TABLETS(200 MG) BY MOUTH AT BEDTIME 06/14/17  Yes York Spaniel, MD  valACYclovir Ralph Dowdy)  500 MG tablet Take 1 tablet (500 mg total) by mouth daily. 08/20/17  Yes Waldon Merl, PA-C  VESICARE 10 MG tablet TAKE 1 TABLET(10 MG) BY MOUTH DAILY 10/08/17  Yes York Spaniel, MD  ALPRAZolam Prudy Feeler) 0.5 MG tablet Take 2 tablets approximately 45 minutes prior to the MRI study, take a third tablet if needed. 11/14/17   York Spaniel, MD    ROS:  Out of a complete 14 system review of symptoms, the patient complains only of the following symptoms, and all other reviewed systems are negative.  Walking problems Urinary incontinence  Blood pressure 122/80, pulse 71, height 5\' 5"  (1.651 m), weight 191 lb (86.6 kg).  Physical Exam  General: The patient is  alert and cooperative at the time of the examination.  Skin: No significant peripheral edema is noted.   Neurologic Exam  Mental status: The patient is alert and oriented x 3 at the time of the examination. The patient has apparent normal recent and remote memory, with an apparently normal attention span and concentration ability.   Cranial nerves: Facial symmetry is present. Speech is normal, no aphasia or dysarthria is noted. Extraocular movements are full. Visual fields are full.  Pupils are equal, round, and reactive to light.  Discs are flat bilaterally.  Motor: The patient has good strength in all 4 extremities.  Sensory examination: Soft touch is decreased slightly on the left face, arm, and leg.  Coordination: The patient has good finger-nose-finger and heel-to-shin bilaterally.  Gait and station: The patient has a wide-based, slow gait.  The patient usually uses a cane for ambulation.  Tandem gait is slightly unsteady.  Romberg is negative but is unsteady.  Reflexes: Deep tendon reflexes are symmetric.   Assessment/Plan:  1.  Multiple sclerosis  2.  Gait disturbance  3.  Mild memory disturbance  4.  Neurogenic bladder  The patient will remain on Tysabri.  She will have blood work done today.  MRI of the brain will be set up to be done with and without gadolinium enhancement.  The patient will have another referral to urology.  The patient is instructed to be more diligent about answering her telephone and checking her phone messages.  The patient will follow-up in 6 months.  Addendum: The patient also indicates ongoing migraine headaches daily, she recently tried Depakote without benefit. She will be given a prescription for Aimovig.  Marlan Palau MD 11/14/2017 10:27 AM  [facility] [address] [place], [place] [address]  Phone [phone] Fax [phone]

## 2017-11-15 ENCOUNTER — Telehealth: Payer: Self-pay

## 2017-11-15 LAB — CBC WITH DIFFERENTIAL/PLATELET
BASOS ABS: 0 10*3/uL (ref 0.0–0.2)
BASOS: 0 %
EOS (ABSOLUTE): 0.1 10*3/uL (ref 0.0–0.4)
Eos: 3 %
Hematocrit: 36.1 % (ref 34.0–46.6)
Hemoglobin: 11.7 g/dL (ref 11.1–15.9)
IMMATURE GRANS (ABS): 0 10*3/uL (ref 0.0–0.1)
IMMATURE GRANULOCYTES: 0 %
LYMPHS: 50 %
Lymphocytes Absolute: 2.1 10*3/uL (ref 0.7–3.1)
MCH: 29.9 pg (ref 26.6–33.0)
MCHC: 32.4 g/dL (ref 31.5–35.7)
MCV: 92 fL (ref 79–97)
MONOS ABS: 0.3 10*3/uL (ref 0.1–0.9)
Monocytes: 7 %
Neutrophils Absolute: 1.7 10*3/uL (ref 1.4–7.0)
Neutrophils: 40 %
PLATELETS: 191 10*3/uL (ref 150–379)
RBC: 3.91 x10E6/uL (ref 3.77–5.28)
RDW: 14.6 % (ref 12.3–15.4)
WBC: 4.3 10*3/uL (ref 3.4–10.8)

## 2017-11-15 LAB — COMPREHENSIVE METABOLIC PANEL
A/G RATIO: 2.3 — AB (ref 1.2–2.2)
ALK PHOS: 79 IU/L (ref 39–117)
ALT: 35 IU/L — AB (ref 0–32)
AST: 25 IU/L (ref 0–40)
Albumin: 4.3 g/dL (ref 3.5–5.5)
BILIRUBIN TOTAL: 0.2 mg/dL (ref 0.0–1.2)
BUN/Creatinine Ratio: 11 (ref 9–23)
BUN: 13 mg/dL (ref 6–24)
CALCIUM: 9.1 mg/dL (ref 8.7–10.2)
CHLORIDE: 107 mmol/L — AB (ref 96–106)
CO2: 23 mmol/L (ref 20–29)
Creatinine, Ser: 1.17 mg/dL — ABNORMAL HIGH (ref 0.57–1.00)
GFR calc Af Amer: 64 mL/min/{1.73_m2} (ref >59)
GFR, EST NON AFRICAN AMERICAN: 55 mL/min/{1.73_m2} — AB (ref >59)
GLOBULIN, TOTAL: 1.9 g/dL (ref 1.5–4.5)
Glucose: 88 mg/dL (ref 65–99)
POTASSIUM: 4.3 mmol/L (ref 3.5–5.2)
SODIUM: 142 mmol/L (ref 134–144)
Total Protein: 6.2 g/dL (ref 6.0–8.5)

## 2017-11-15 NOTE — Telephone Encounter (Signed)
I called pt, explained her lab results and that Dr. Anne Hahn will follow the mild renal insufficiency and mild elevation in chloride over time. We will call pt when her JCV results become available. Pt verbalized understanding of results.

## 2017-11-15 NOTE — Telephone Encounter (Signed)
-----   Message from York Spaniel, MD sent at 11/15/2017  7:22 AM EDT -----  Mild renal insufficiency, mild elevation in chloride level, CBC is unremarkable.  We will follow over time.  Very minimal elevation in ALT enzyme.  This is of no clinical concern.  JC viral antibody panel is pending. Please call the patient. ----- Message ----- From: Nell Range Lab Results In Sent: 11/15/2017   5:39 AM To: York Spaniel, MD

## 2017-11-19 ENCOUNTER — Other Ambulatory Visit: Payer: Self-pay | Admitting: Neurology

## 2017-11-19 ENCOUNTER — Other Ambulatory Visit: Payer: Self-pay | Admitting: Adult Health

## 2017-11-22 ENCOUNTER — Ambulatory Visit
Admission: RE | Admit: 2017-11-22 | Discharge: 2017-11-22 | Disposition: A | Discharge status: Home / Self Care | Payer: Medicare Other | Source: Ambulatory Visit | Attending: Neurology | Admitting: Neurology

## 2017-11-22 DIAGNOSIS — G35 Multiple sclerosis: Secondary | ICD-10-CM

## 2017-11-22 MED ORDER — GADOBENATE DIMEGLUMINE 529 MG/ML IV SOLN
18.0000 mL | Freq: Once | INTRAVENOUS | Status: AC | PRN
Start: 2017-11-22 — End: 2017-11-22
  Administered 2017-11-22: 18 mL via INTRAVENOUS

## 2017-11-23 ENCOUNTER — Telehealth: Payer: Self-pay | Admitting: Neurology

## 2017-11-23 NOTE — Telephone Encounter (Signed)
I called the patient.  MRI of the brain shows good stability from one done 2 years ago.  The patient will remain on Tysabri.   MRI brain 11/23/17:  IMPRESSION: This MRI of the brain with and without contrast shows the following: 1.    Multiple T2/FLAIR hyperintense foci in the brainstem, cerebellum and hemispheres in a pattern and configuration consistent with chronic demyelinating plaque associated with multiple sclerosis.  There are no new lesions on the current scan compared to the 03/23/2016 MRI.  The lesions that enhanced in 2017 no longer do so. 2.    There is a normal enhancement pattern and there are no acute findings.

## 2017-11-23 NOTE — Telephone Encounter (Signed)
JC viral antibody panel is negative, index is 0.14.

## 2017-11-27 ENCOUNTER — Telehealth: Payer: Self-pay | Admitting: *Deleted

## 2017-11-27 NOTE — Telephone Encounter (Addendum)
Initiated PA Aimovig 140mg  (1 pen/30days) on covermymeds. Key: [id number]. In process of completing.   Pt has tried failed: topamax, ibuporfen, tylenol, nebivolol, amitriptyline, botox, toradol, imitrex, gabapentin, depakote. Cannot take beta blockers d/t hx depression. Can exacerbate depression.

## 2017-11-27 NOTE — Telephone Encounter (Signed)
Received forms again to fill out. Marcelino Duster, RN called pt to remind her we already filled out on 07/04/18. Relayed information below again. Pt advised she had not called yet to speak with who is requesting forms. She states she is going to call them today.

## 2017-11-27 NOTE — Telephone Encounter (Signed)
Submitted PA. Waiting on determination.  "[facility] is reviewing your PA request. Typically an electronic response will be received within 72 hours" 

## 2017-11-28 NOTE — Telephone Encounter (Signed)
Received fax notification from [employer] that PA approved under Medicare Part D until 03/06/2018. PA#: [id number].

## 2017-12-14 ENCOUNTER — Telehealth: Payer: Self-pay | Admitting: *Deleted

## 2017-12-14 NOTE — Telephone Encounter (Signed)
Called pt. Advised we received request to fill out DMA 3051 form again from [facility]. Advised this is the form we filled out back in November for her. She thought she spoke w/ them but cannot remember. She is going to come by office today to sign release form for [place] to send form to them. Placed release form up front for pt to sign.

## 2017-12-21 ENCOUNTER — Ambulatory Visit: Payer: Medicaid Other

## 2017-12-21 NOTE — Telephone Encounter (Signed)
Megan from [facility] has called stating she has been told pt has come here and signed off so that the form can be completed and sent back to [facility].  Aundra Millet states she can be called at [phone] the form can be faxed to her also at [phone]

## 2017-12-21 NOTE — Progress Notes (Deleted)
Subjective:   Connie Robertson is a 47 y.o. female who presents for an Initial Medicare Annual Wellness Visit.  Review of Systems    No ROS.  Medicare Wellness Visit. Additional risk factors are reflected in the social history.     Sleep patterns: Home Safety/Smoke Alarms: Feels safe in home. Smoke alarms in place.  Living environment; residence and Firearm Safety:  Seat Belt Safety/Bike Helmet: Wears seat belt.   Female:   Pap-07/26/2017       Mammo-08/09/2017, normal.        Dexa scan-N/A       CCS-N/A     Objective:    There were no vitals filed for this visit. There is no height or weight on file to calculate BMI.  Advanced Directives 03/23/2016 03/22/2016 02/08/2016 08/05/2015 05/27/2015 04/05/2015 11/25/2014  Does Patient Have a Medical Advance Directive? No No No No No No No  Does patient want to make changes to medical advance directive? - - - - - No - Patient declined -  Would patient like information on creating a medical advance directive? - No - patient declined information No - patient declined information - - - -    Current Medications (verified) Outpatient Encounter Medications as of 12/21/2017  Medication Sig  . ALPRAZolam (XANAX) 0.5 MG tablet Take 2 tablets approximately 45 minutes prior to the MRI study, take a third tablet if needed.  . baclofen (LIORESAL) 10 MG tablet Take 1 tablet (10 mg total) by mouth at bedtime.  Dorise Hiss (AIMOVIG 140 DOSE) 70 MG/ML SOAJ Inject 140 mg into the skin every 30 (thirty) days.  Marland Kitchen gabapentin (NEURONTIN) 300 MG capsule TAKE 1 CAPSULE(300 MG) BY MOUTH TWICE DAILY  . lubiprostone (AMITIZA) 24 MCG capsule Amitiza 24 mcg capsule  TK 1 C PO BID  . meloxicam (MOBIC) 7.5 MG tablet Take 1 tablet (7.5 mg total) by mouth daily.  . natalizumab (TYSABRI) 300 MG/15ML injection Tysabri 300 mg/15 mL intravenous solution  . oxybutynin (DITROPAN-XL) 10 MG 24 hr tablet TAKE 1 TABLET BY MOUTH TWICE DAILY  . phentermine 37.5 MG capsule  phentermine 37.5 mg capsule  TK 1 C PO ONCE D IN THE MORNING  . phenytoin (DILANTIN) 100 MG ER capsule Take 1 capsule by mouth daily.  . phenytoin (DILANTIN) 100 MG ER capsule TAKE 1 CAPSULE(100 MG) BY MOUTH TWICE DAILY  . topiramate (TOPAMAX) 50 MG tablet TAKE 4 TABLETS(200 MG) BY MOUTH AT BEDTIME  . valACYclovir (VALTREX) 500 MG tablet Take 1 tablet (500 mg total) by mouth daily.  . VESICARE 10 MG tablet TAKE 1 TABLET(10 MG) BY MOUTH DAILY   No facility-administered encounter medications on file as of 12/21/2017.     Allergies (verified) Patient has no known allergies.   History: Past Medical History:  Diagnosis Date  . Abnormality of gait 11/25/2014  . Chronic daily headache 2003   Love  . Depression   . Memory disorder 02/29/2016  . MS (multiple sclerosis) (HCC) 1992   Dr. Sandria Manly  . Multiple sclerosis (HCC) 11/21/2013  . Neurogenic bladder 11/21/2013   Past Surgical History:  Procedure Laterality Date  . ABLATION COLPOCLESIS  2004  . CESAREAN SECTION    . TUBAL LIGATION     Family History  Problem Relation Age of Onset  . Heart failure Father   . Multiple sclerosis Sister   . Cancer Neg Hx   . Diabetes Neg Hx   . Hyperlipidemia Neg Hx   . Hypertension Neg Hx   .  Kidney disease Neg Hx   . Stroke Neg Hx    Social History   Socioeconomic History  . Marital status: Divorced    Spouse name: Not on file  . Number of children: 2  . Years of education: college  . Highest education level: Not on file  Occupational History  . Occupation: disability  Social Needs  . Financial resource strain: Not on file  . Food insecurity:    Worry: Not on file    Inability: Not on file  . Transportation needs:    Medical: Not on file    Non-medical: Not on file  Tobacco Use  . Smoking status: Never Smoker  . Smokeless tobacco: Never Used  Substance and Sexual Activity  . Alcohol use: Yes    Frequency: Never    Comment: social  . Drug use: No  . Sexual activity: Yes    Birth  control/protection: Surgical    Comment: Tubal  Lifestyle  . Physical activity:    Days per week: Not on file    Minutes per session: Not on file  . Stress: Not on file  Relationships  . Social connections:    Talks on phone: Not on file    Gets together: Not on file    Attends religious service: Not on file    Active member of club or organization: Not on file    Attends meetings of clubs or organizations: Not on file    Relationship status: Not on file  Other Topics Concern  . Not on file  Social History Narrative   Patient is right handed.   Patient drinks 2 cups of coffee daily.    Tobacco Counseling Counseling given: Not Answered   Activities of Daily Living In your present state of health, do you have any difficulty performing the following activities: 08/17/2017  Hearing? N  Vision? N  Difficulty concentrating or making decisions? N  Walking or climbing stairs? N  Dressing or bathing? N  Doing errands, shopping? N  Some recent data might be hidden     Immunizations and Health Maintenance Immunization History  Administered Date(s) Administered  . Influenza Split 08/18/2011  . Tdap 08/18/2011   There are no preventive care reminders to display for this patient.  Patient Care Team: Noel Journey as PCP - General (Family Medicine) York Spaniel, MD as Consulting Physician (Neurology) Ilda Mori, MD as Consulting Physician (Obstetrics and Gynecology)  Indicate any recent Medical Services you may have received from other than [facility] providers in the past year (date may be approximate).     Assessment:   This is a routine wellness examination for Connie Robertson.  Hearing/Vision screen No exam data present  Dietary issues and exercise activities discussed:   Diet (meal preparation, eat out, water intake, caffeinated beverages, dairy products, fruits and vegetables):   Breakfast: Lunch:  Dinner:      Goals    None     Depression  Screen No flowsheet data found.  Fall Risk No flowsheet data found.   Cognitive Function:        Screening Tests Health Maintenance  Topic Date Due  . INFLUENZA VACCINE  04/19/2018 (Originally 03/07/2018)  . HIV Screening  09/24/2018 (Originally [birth year]1984)  . PAP SMEAR  07/26/2020  . DTaP/Tdap/Td (2 - Td) 08/17/2021  . TETANUS/TDAP  08/17/2021       Plan:     I have personally reviewed and noted the following in the patient's chart:   .  Medical and social history . Use of alcohol, tobacco or illicit drugs  . Current medications and supplements . Functional ability and status . Nutritional status . Physical activity . Advanced directives . List of other physicians . Hospitalizations, surgeries, and ER visits in previous 12 months . Vitals . Screenings to include cognitive, depression, and falls . Referrals and appointments  In addition, I have reviewed and discussed with patient certain preventive protocols, quality metrics, and best practice recommendations. A written personalized care plan for preventive services as well as general preventive health recommendations were provided to patient.     Alysia Penna, RN   12/21/2017

## 2017-12-24 ENCOUNTER — Telehealth: Payer: Self-pay | Admitting: *Deleted

## 2017-12-24 NOTE — Telephone Encounter (Signed)
Pt called she is wanting to know how long it will take to feel results from Aimovig. Last month was the 1st month. Pt has spoken with billing today and is to come by the office today to make a payment

## 2017-12-24 NOTE — Telephone Encounter (Signed)
Pt form on Emma desk 

## 2017-12-24 NOTE — Telephone Encounter (Signed)
Called and spoke w/ pt. Advised I spoke w/ billing who stated she has a balance with our office. She will need to speak with them first to make a payment and then I can get her scheduled. I have something on Wednesday. Asked her to call me back asap after she speaks w/ billing. She will call right now to speak w/ billing and call me back.

## 2017-12-24 NOTE — Telephone Encounter (Signed)
Wants to see about scheduling an appointment.  Not feeling any better and cant wait until the next available appointment.  Please call.

## 2017-12-25 NOTE — Telephone Encounter (Addendum)
Tried calling pt back. Gave [facility] phone number for her to call.   She came by office and spoke w/ Arman Bogus. to sign release form for [place] to send previously filled out form (DMA 305). I faxed completed form to [facility] at [phone]. Received fax confirmation.   Verified w/ billing she has not yet made a payment towards her balance. She needs to speak w/ billing to make a payment if she calls to be able to schedule a f/u.

## 2017-12-25 NOTE — Telephone Encounter (Signed)
Tried calling pt again, went to VM

## 2017-12-26 NOTE — Telephone Encounter (Signed)
Per Seth Bake at front desk, pt came to office to make payment but they did not have change. Pt will stop back in the office today or tomorrow to make payment/schedule appt

## 2017-12-27 ENCOUNTER — Telehealth: Payer: Self-pay | Admitting: Neurology

## 2017-12-27 NOTE — Telephone Encounter (Signed)
Called pt. Advised she should stay on Aimovig right now. She is already on 140mg  dose which is the highest dose. She is going to come by today to make payment towards balance. She was currently picking up her kids. She is aware DMA-3051 forms require office visit within last 90 days. She would have to be seen so we can send updated forms/note. She will make f/u when she comes to make payment today.

## 2017-12-27 NOTE — Telephone Encounter (Signed)
Called pt. Advised she should stay on Aimovig right now. She is already on 140mg dose which is the highest dose. She is going to come by today to make payment towards balance. She was currently picking up her kids. She is aware DMA-3051 forms require office visit within last 90 days. She would have to be seen so we can send updated forms/note. She will make f/u when she comes to make payment today.  

## 2017-12-27 NOTE — Telephone Encounter (Signed)
Pt came by office and paid 10.00 towards balance. Scheduled appt for 01/04/18 at 12pm.

## 2017-12-27 NOTE — Telephone Encounter (Signed)
Pt calling re: her Aimovig, she states she is about 2 months in and recalls that she was told she should feel something by month 3.  Pt is wanting to discuss possibly increasing her dosage.  Please call

## 2018-01-04 ENCOUNTER — Ambulatory Visit: Payer: Self-pay | Admitting: Neurology

## 2018-01-07 ENCOUNTER — Encounter: Payer: Self-pay | Admitting: Neurology

## 2018-02-06 ENCOUNTER — Other Ambulatory Visit: Payer: Self-pay | Admitting: Neurology

## 2018-02-06 ENCOUNTER — Telehealth: Payer: Self-pay | Admitting: Neurology

## 2018-02-06 NOTE — Telephone Encounter (Signed)
I called [facility] and pt last refilled rx oxybutynin on 11/21/17 qty 180. She is not due for another refill until 02/20/18.

## 2018-02-06 NOTE — Telephone Encounter (Signed)
Pt called stating she currently out of oxybutynin (DITROPAN-XL) 10 MG 24 hr tablet and will need a prescription sent over to [facility] today if possible.

## 2018-02-06 NOTE — Telephone Encounter (Signed)
I will go ahead and refill the prescription for oxybutynin.

## 2018-02-10 ENCOUNTER — Other Ambulatory Visit: Payer: Self-pay | Admitting: Neurology

## 2018-02-10 ENCOUNTER — Other Ambulatory Visit: Payer: Self-pay | Admitting: Adult Health

## 2018-02-14 ENCOUNTER — Telehealth: Payer: Self-pay | Admitting: Neurology

## 2018-02-14 NOTE — Telephone Encounter (Addendum)
Called pt back. Last night she took two pills of phenytoin 100mg . Her prescription is written for her to take 1 tablet twice daily. She advised she took extra without calling our office first. I advised her to always call first prior to adjusting her dose. She takes phenytoin for facial spasms.  After taking tablets she noticed around 12am-1am her face and legs started trembling "really fast". Felt like she was having heart palpitations. Her daughter was with her. She called EMS and they recommended she go to ED, she declined. She stated "she looked in mirror and her "face was twisted" on right side. This happened once before. I asked her to further explain and she again repeated her face was twisted. She states right side more droopy. Pt does not feel better today. Face is still trembling. She states " She has grooves in face and she can tell something is going on". Denies starting any new meds. Advised Dr. Anne Hahn out of the office and will speak with [facility] and call her back. Advised we will most likely have her go to ED to be evaluated asap.  Scheduled earlier f/u with Dr. Anne Hahn on 02/27/18.

## 2018-02-14 NOTE — Telephone Encounter (Addendum)
Called pt back. Advised I spoke with Dr. Frances Furbish and she should proceed to ED and be evaluated asap after speaking with her. She is going to go to [facility]. Advised her to f/u with Dr. Anne Hahn after on 02/27/18. She verbalized understanding and appreciation.

## 2018-02-14 NOTE — Telephone Encounter (Signed)
Pt has called stating that last night she took 2 pills that she generally takes when she has trembling in her face.  Pt thinks she may have had a bad reaction to this medication(she can not recall the name of the medication). Pt states her legs trembled badly as well.  Pt stated she felt like she was having a stroke or a heart attack.  Pt said her face was twisted, speech was slurred(but better now) pt called for ambulance to evaluate her but she chose not to go to ED.  Pt states that her walking is off also.  Pt is asking for a call as to what would be suggested for her to do.

## 2018-02-27 ENCOUNTER — Encounter: Payer: Self-pay | Admitting: Neurology

## 2018-02-27 ENCOUNTER — Ambulatory Visit (INDEPENDENT_AMBULATORY_CARE_PROVIDER_SITE_OTHER): Payer: Medicare Other | Admitting: Neurology

## 2018-02-27 VITALS — BP 127/87 | HR 57 | Ht 65.0 in | Wt 193.0 lb

## 2018-02-27 DIAGNOSIS — G44221 Chronic tension-type headache, intractable: Secondary | ICD-10-CM

## 2018-02-27 DIAGNOSIS — N319 Neuromuscular dysfunction of bladder, unspecified: Secondary | ICD-10-CM

## 2018-02-27 DIAGNOSIS — R269 Unspecified abnormalities of gait and mobility: Secondary | ICD-10-CM | POA: Diagnosis not present

## 2018-02-27 DIAGNOSIS — G35 Multiple sclerosis: Secondary | ICD-10-CM | POA: Diagnosis not present

## 2018-02-27 DIAGNOSIS — R413 Other amnesia: Secondary | ICD-10-CM

## 2018-02-27 MED ORDER — NORTRIPTYLINE HCL 10 MG PO CAPS: ORAL_CAPSULE | ORAL | 3 refills | Status: DC

## 2018-02-27 NOTE — Progress Notes (Signed)
Reason for visit: Multiple sclerosis, migraine headache  Connie Robertson is an 47 y.o. female  History of present illness:  Connie Robertson is a 47 year old right-handed black female with a history of multiple sclerosis.  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  The patient has a gait disturbance, she will fall on occasion.  On 14 February 2018, the patient had some worsening of her hemifacial spasm, she double dosed on her Dilantin and then had an event of increased heart rate and chills, facial drawing.  This did resolve, she called EMS but did not go to the hospital.  The patient has not noted any new numbness or weakness of the face, arms, legs.  She has chronic insomnia.  She has chronic daily headaches over the last 17 years.  She has not responded to multiple medications including Aimovig that she is on currently.  She will have her third injection of Aimovig in the near future.  She was placed on Depakote in the past, she now claims that she never took the medication because the pills were too big.  She is on Topamax, 200 mg at night.  She takes Dilantin 100 mg twice daily for the hemifacial spasm.  The patient is on Vesicare and oxybutynin for her bladder, she continues to have problems.  She has been referred to urology but she has not yet been seen.  The patient will be seeing her ophthalmologist in the near future.  She remains on Tysabri.  The patient is also on gabapentin.  She takes Amitiza for her chronic constipation.  She returns to this office for an evaluation.  Past Medical History:  Diagnosis Date  . Abnormality of gait 11/25/2014  . Chronic daily headache 2003   Love  . Depression   . Memory disorder 02/29/2016  . MS (multiple sclerosis) (HCC) 1992   Dr. Sandria Manly  . Multiple sclerosis (HCC) 11/21/2013  . Neurogenic bladder 11/21/2013    Past Surgical History:  Procedure Laterality Date  . ABLATION COLPOCLESIS  2004  .  CESAREAN SECTION    . TUBAL LIGATION      Family History  Problem Relation Age of Onset  . Heart failure Father   . Multiple sclerosis Sister   . Cancer Neg Hx   . Diabetes Neg Hx   . Hyperlipidemia Neg Hx   . Hypertension Neg Hx   . Kidney disease Neg Hx   . Stroke Neg Hx     Social history:  reports that she has never smoked. She has never used smokeless tobacco. She reports that she drinks alcohol. She reports that she does not use drugs.   No Known Allergies  Medications:  Prior to Admission medications   Medication Sig Start Date End Date Taking? Authorizing Provider  ALPRAZolam Prudy Feeler) 0.5 MG tablet Take 2 tablets approximately 45 minutes prior to the MRI study, take a third tablet if needed. 11/14/17   York Spaniel, MD  baclofen (LIORESAL) 10 MG tablet Take 1 tablet (10 mg total) by mouth at bedtime. 09/24/17   Waldon Merl, PA-C  Erenumab-aooe (AIMOVIG 140 DOSE) 70 MG/ML SOAJ Inject 140 mg into the skin every 30 (thirty) days. 11/14/17   York Spaniel, MD  gabapentin (NEURONTIN) 300 MG capsule TAKE 1 CAPSULE(300 MG) BY MOUTH TWICE DAILY 10/08/17   York Spaniel, MD  lubiprostone (AMITIZA) 24 MCG capsule Amitiza 24 mcg capsule  TK 1 C PO  BID    [provider]  meloxicam (MOBIC) 7.5 MG tablet Take 1 tablet (7.5 mg total) by mouth daily. 09/24/17   Waldon Merl, PA-C  natalizumab (TYSABRI) 300 MG/15ML injection Tysabri 300 mg/15 mL intravenous solution    [provider]  oxybutynin (DITROPAN-XL) 10 MG 24 hr tablet TAKE 1 TABLET BY MOUTH TWICE DAILY 02/06/18   York Spaniel, MD  phentermine 37.5 MG capsule phentermine 37.5 mg capsule  TK 1 C PO ONCE D IN THE MORNING    [provider]  phenytoin (DILANTIN) 100 MG ER capsule TAKE 1 CAPSULE(100 MG) BY MOUTH TWICE DAILY 02/11/18   York Spaniel, MD  topiramate (TOPAMAX) 50 MG tablet TAKE 4 TABLETS(200 MG) BY MOUTH AT BEDTIME 02/11/18   York Spaniel, MD  valACYclovir (VALTREX)  500 MG tablet Take 1 tablet (500 mg total) by mouth daily. 08/20/17   Waldon Merl, PA-C  VESICARE 10 MG tablet TAKE 1 TABLET(10 MG) BY MOUTH DAILY 10/08/17   York Spaniel, MD    ROS:  Out of a complete 14 system review of symptoms, the patient complains only of the following symptoms, and all other reviewed systems are negative.  Fatigue, insomnia Gait disturbance Headache Bladder incontinence  Blood pressure 127/87, pulse (!) 57, height 5\' 5"  (1.651 m), weight 193 lb (87.5 kg).  Physical Exam  General: The patient is alert and cooperative at the time of the examination.  The patient is mildly obese.  Skin: No significant peripheral edema is noted.   Neurologic Exam  Mental status: The patient is alert and oriented x 3 at the time of the examination. The patient has apparent normal recent and remote memory, with an apparently normal attention span and concentration ability.   Cranial nerves: Facial symmetry is present. Speech is normal, no aphasia or dysarthria is noted. Extraocular movements are full. Visual fields are full.  Pupils are equal, round, and reactive to light.  Discs are flat bilaterally.  Motor: The patient has good strength in all 4 extremities.  Sensory examination: Soft touch sensation is decreased on the left face, arm, and leg.  Coordination: The patient has good finger-nose-finger and heel-to-shin bilaterally.  Gait and station: The patient has a slightly wide-based, unsteady gait.  Tandem gait is unsteady.  Romberg is negative.  Reflexes: Deep tendon reflexes are symmetric.   MRI brain 11/22/17:  IMPRESSION: This MRI of the brain with and without contrast shows the following: 1.    Multiple T2/FLAIR hyperintense foci in the brainstem, cerebellum and hemispheres in a pattern and configuration consistent with chronic demyelinating plaque associated with multiple sclerosis.  There are no new lesions on the current scan compared to the 03/23/2016  MRI.  The lesions that enhanced in 2017 no longer do so. 2.    There is a normal enhancement pattern and there are no acute findings.  * MRI scan images were reviewed online. I agree with the written report.    Assessment/Plan:  1.  Multiple sclerosis  2.  Mild cognitive impairment  3.  Gait disturbance  4.  Neurogenic bladder  5.  Chronic daily headache  6.  Chronic insomnia  The patient will be seeing urology in the near future.  She will be placed on nortriptyline for sleep and for her headache.  If the third injection of Aimovig does her no good for her headache, she will stop the medication.  The patient will continue the Tysabri, she will follow-up in  6 months.  Recent MRI of the brain shows good stability of her MS lesions but the patient has extensive involvement of the brain.  Marlan Palau MD 02/27/2018 7:56 AM  [facility] [address] [place], [place] [address]  Phone [phone] Fax [phone]

## 2018-02-27 NOTE — Patient Instructions (Signed)
   We will start nortriptyline 10 mg capsule at night for the headache and for sleep.  Pamelor (nortriptyline) is an antidepressant medication that has many uses that may include headache, whiplash injuries, or for peripheral neuropathy pain. Side effects may include drowsiness, dry mouth, blurred vision, or constipation. As with any antidepressant medication, worsening depression may occur. If you had any significant side effects, please call our office. The full effects of this medication may take 7-10 days after starting the drug, or going up on the dose.

## 2018-03-06 ENCOUNTER — Telehealth: Payer: Self-pay | Admitting: *Deleted

## 2018-03-06 NOTE — Telephone Encounter (Signed)
Submitted PA aimovig 140mg  1pen per month on covermymeds. Key: [id number]. Rx #: [id number]. Waiting on determination.   "[employer] is reviewing your PA request. Typically an electronic response will be received within 72 hours."

## 2018-03-07 NOTE — Telephone Encounter (Signed)
Request Reference Number: [id number]. AIMOVIG INJ 140MG /ML is approved through 08/06/2018. For further questions, call [phone].

## 2018-03-20 ENCOUNTER — Other Ambulatory Visit: Payer: Self-pay | Admitting: Internal Medicine

## 2018-03-20 DIAGNOSIS — E2839 Other primary ovarian failure: Secondary | ICD-10-CM

## 2018-03-26 ENCOUNTER — Telehealth: Payer: Self-pay | Admitting: Neurology

## 2018-03-26 NOTE — Telephone Encounter (Signed)
I called the patient.  The patient has hemifacial spasm, the Dilantin is offering no benefit for this.  She will stop the Dilantin, she may try to go up on the baclofen, she takes 10 mg at night.  She will let me know if this is helpful.  Nighttime is the worst time for her symptoms.

## 2018-03-26 NOTE — Telephone Encounter (Signed)
Patient calling to discuss phenytoin (DILANTIN) 100 MG ER capsule. It is supposed to help with eye twitching but says it is not. Patient also wants to know if form was received from [facility] regarding home health.

## 2018-03-26 NOTE — Telephone Encounter (Signed)
Gave completed/signed [facility] form to medical records to process for pt.

## 2018-03-26 NOTE — Addendum Note (Signed)
Addended by: York Spaniel on: 03/26/2018 01:28 PM   Modules accepted: Orders

## 2018-03-26 NOTE — Telephone Encounter (Signed)
Dr. Anne Hahn- please advise. I also gave you form to review/complete. I did as much as I could. Thank you

## 2018-04-28 ENCOUNTER — Other Ambulatory Visit: Payer: Self-pay | Admitting: Neurology

## 2018-05-08 ENCOUNTER — Other Ambulatory Visit: Payer: Medicare Other

## 2018-05-17 ENCOUNTER — Other Ambulatory Visit: Payer: Self-pay | Admitting: Neurology

## 2018-05-20 ENCOUNTER — Other Ambulatory Visit: Payer: Self-pay

## 2018-05-20 MED ORDER — ERENUMAB-AOOE 70 MG/ML ~~LOC~~ SOAJ: 140.0000 mg | SUBCUTANEOUS | 3 refills | Status: DC

## 2018-05-23 ENCOUNTER — Ambulatory Visit: Payer: Medicare Other | Admitting: Adult Health

## 2018-06-06 ENCOUNTER — Ambulatory Visit: Payer: Medicare Other | Admitting: Neurology

## 2018-07-08 ENCOUNTER — Encounter: Payer: Self-pay | Admitting: *Deleted

## 2018-07-08 ENCOUNTER — Telehealth: Payer: Self-pay | Admitting: Neurology

## 2018-07-08 NOTE — Telephone Encounter (Signed)
Letter up front [facility]/fim 

## 2018-07-08 NOTE — Telephone Encounter (Signed)
Patient called and requested to speak with Dr. Anne Hahn' nurse. No further details were given. Please call and advise.

## 2018-07-08 NOTE — Telephone Encounter (Signed)
I spoke with Connie Robertson. She needs a letter stating she has a chronic illness that will require lifelong f/u and tx.  with prescription medication. She sts. it is ok to include her dx. of MS.  I will get this ready for her and she will pick it up in our office tomorrow am/fim

## 2018-07-26 ENCOUNTER — Telehealth: Payer: Self-pay | Admitting: *Deleted

## 2018-07-26 NOTE — Telephone Encounter (Signed)
PA for Aimovig 140mg /ml SQ Q30 days completed via CoverMyMeds. Key# [id number]. Dx: Chronic Migraine. Tried and failed meds: Nortriptyline, Botox, Depakote, Baclofen, Meloxicam, Topamax, Tylenol, Ibuprofen./fim

## 2018-07-29 NOTE — Telephone Encounter (Signed)
PA approval for Aimovig received from [facility]. Approval active from 07/26/2018 till 08/07/2019.

## 2018-08-09 ENCOUNTER — Telehealth: Payer: Self-pay | Admitting: Neurology

## 2018-08-09 ENCOUNTER — Telehealth: Payer: Self-pay

## 2018-08-09 NOTE — Telephone Encounter (Signed)
Received fax from [facility] stating they have been unsuccessful reaching the pt regarding her Tysabri infusion. I spoke with the pt today and was advised she has scheduled her Tysabri infusion for 08/12/2018 through [facility].  I faxed [facility] back at # [phone] with this information provided and received confirmation fax. MB RN

## 2018-08-09 NOTE — Telephone Encounter (Signed)
Transferred pt call to [facility] 10:14am

## 2018-08-09 NOTE — Telephone Encounter (Signed)
I returned pt's call, she states she is in need of a letter to take with her to court within the next couple of months.    Pt states the letter needs to include she in under CW, MD care for MS and due to the dx being lifelong/chronic she will need f/u appt along with prescription medication treatment. Letter also needs to include she is currently on a monthly IV infusion for treatment right now and is unable to be employed full time due to her dx/symptoms of MS .

## 2018-08-09 NOTE — Telephone Encounter (Signed)
The letter written seems appropriate, I will print and signed.

## 2018-08-09 NOTE — Telephone Encounter (Signed)
Pt is needing a letter written for her. And also is wanting to know if there is a cheaper medication for her to take. Please advise.

## 2018-08-12 NOTE — Telephone Encounter (Signed)
Pt was scheduled to come in for a infusion today(08/09/18) and I was able to deliver the letter while the pt was receiving her tysabri infusion.   Pt was advised if she needs anything further to please let our office know. Pt was agreeable MB RN

## 2018-08-15 ENCOUNTER — Telehealth: Payer: Self-pay | Admitting: Neurology

## 2018-08-15 ENCOUNTER — Other Ambulatory Visit: Payer: Self-pay | Admitting: Neurology

## 2018-08-15 MED ORDER — OXYBUTYNIN CHLORIDE ER 10 MG PO TB24: ORAL_TABLET | ORAL | 1 refills | Status: DC

## 2018-08-15 NOTE — Telephone Encounter (Signed)
Pt called stating she is still having some urinary insentience and is requesting the strength of her oxybutynin to be increased, maybe to the 10 or 15 mg?  Advised I would fwd to MD to review, pt was agreeable.

## 2018-08-15 NOTE — Telephone Encounter (Signed)
I called the patient.  The patient still having some issues on Ditropan 10 mg twice daily, maximum dose is 30 mg daily, I will have the patient go to 1 in the morning and 2 in the evening, if this is not effective, she may need to see a urology physician.

## 2018-08-15 NOTE — Addendum Note (Signed)
Addended by: York Spaniel on: 08/15/2018 01:22 PM   Modules accepted: Orders

## 2018-08-15 NOTE — Telephone Encounter (Signed)
Pt would like advise on her medication oxybutynin (DITROPAN-XL) 10 MG 24 hr tablet Please advise.

## 2018-08-17 ENCOUNTER — Telehealth: Payer: Self-pay | Admitting: Neurology

## 2018-08-17 NOTE — Telephone Encounter (Signed)
Patient called re: Dilantin Rx. It looks to me, it was disc. In Aug. After patient called and reported it was not helping the facial twitching. I had a very extended conv with patient, regarding her current Sx: as I understand, she has tried Dilantin for facial twitching, and while the facial twitching is not so bad, she is primarily bothered by heavy feeling in her legs, esp at night, having trouble sleeping at night. When she called in August, she was advised to disc the dilantin and increase the Baclofen. The last Baclofen Rx was earlier last year by primary provider, from what I can see. She has Rx for nortriptyline. She has still been taking the Dilantin, last filled, per pt, in Oct. She is essentially wanting to discuss her leg pain. She has, I believe, diclofenac available and also had neurontin. She asked if she could take the neurontin during the day. I told her, she could take it up to 2 times during the day, but should be mindful, that it can be sedating and impair her ability to drive. She understood. She was last see in clinic in July 2019 and has appt with MM on 09/05/18, I told her, maybe there is a chance to bring her in for a sooner appt with either KW or MM. She was appreciate and voiced understanding. She has multiple meds on list, last Rx not recent for some, not sure, what she is actually currently taking and she did not recollect the advice to stop the Dilantin from back in August. She may need assistance in streamlining her med list. Please call pt for a sooner than scheduled appt if deemed appropriate.

## 2018-08-19 ENCOUNTER — Telehealth: Payer: Self-pay | Admitting: Neurology

## 2018-08-19 NOTE — Telephone Encounter (Signed)
Per Dr. Anne Hahn, schedule for a earlier appt. Pt scheduled for 130 pm on 08/21/18.

## 2018-08-19 NOTE — Telephone Encounter (Signed)
Patient having headaches & more problems walking. Her legs feels heavy. She is requesting a call from Dr Anne Hahn

## 2018-08-19 NOTE — Telephone Encounter (Addendum)
Pt scheduled for 08/21/18 at 1:30 pm with Dr. Patrecia PaceWills.

## 2018-08-19 NOTE — Telephone Encounter (Signed)
I called the patient.  She began having some problems with left leg pain and feeling as if her legs are heavy over the weekend, she denies any changes in bowel bladder, she does have some blurring of vision but she is on nortriptyline.  She reports an increase in her headaches, she has had headaches that lasted at least 3 days over the weekend.  The patient is on Tysabri.  I will get her worked in for revisit.  We need to discuss treatment for headaches, and determine what is causing the left leg symptoms and leg heaviness symptoms.

## 2018-08-20 ENCOUNTER — Telehealth: Payer: Self-pay | Admitting: Neurology

## 2018-08-20 NOTE — Telephone Encounter (Signed)
Patient  Has called a few times . Patient wants to come today . Please call. Thanks Annabelle Harman

## 2018-08-21 ENCOUNTER — Telehealth: Payer: Self-pay | Admitting: Neurology

## 2018-08-21 ENCOUNTER — Encounter: Payer: Self-pay | Admitting: Neurology

## 2018-08-21 ENCOUNTER — Ambulatory Visit (INDEPENDENT_AMBULATORY_CARE_PROVIDER_SITE_OTHER): Payer: Medicare Other | Admitting: Neurology

## 2018-08-21 VITALS — BP 120/78 | HR 71 | Ht 64.5 in | Wt 194.0 lb

## 2018-08-21 DIAGNOSIS — G514 Facial myokymia: Secondary | ICD-10-CM | POA: Diagnosis not present

## 2018-08-21 DIAGNOSIS — R413 Other amnesia: Secondary | ICD-10-CM

## 2018-08-21 DIAGNOSIS — R269 Unspecified abnormalities of gait and mobility: Secondary | ICD-10-CM | POA: Diagnosis not present

## 2018-08-21 DIAGNOSIS — Z5181 Encounter for therapeutic drug level monitoring: Secondary | ICD-10-CM

## 2018-08-21 DIAGNOSIS — G35 Multiple sclerosis: Secondary | ICD-10-CM

## 2018-08-21 MED ORDER — NORTRIPTYLINE HCL 10 MG PO CAPS: ORAL_CAPSULE | ORAL | 3 refills | Status: DC

## 2018-08-21 MED ORDER — ALPRAZOLAM 0.5 MG PO TABS: ORAL_TABLET | ORAL | 0 refills | Status: DC

## 2018-08-21 MED ORDER — PREDNISONE 10 MG PO TABS: ORAL_TABLET | ORAL | 0 refills | Status: DC

## 2018-08-21 MED ORDER — MIRABEGRON ER 50 MG PO TB24: 50.0000 mg | ORAL_TABLET | Freq: Every day | ORAL | 3 refills | Status: DC

## 2018-08-21 MED ORDER — TOPIRAMATE 50 MG PO TABS: ORAL_TABLET | ORAL | 3 refills | Status: DC

## 2018-08-21 NOTE — Progress Notes (Signed)
Reason for visit: Multiple sclerosis  Connie Robertson is an 48 y.o. female  History of present illness:  Connie Robertson is a 48 year old right-handed white female with a history of multiple sclerosis associated with a gait disorder, cognitive disorder, and neurogenic bladder.  The patient has migraine headaches, her headaches have worsened over the last 3 to 4 days.  In the same timeframe, the patient began having discomfort with burning sensations down the right leg and some pain down the left leg as well without low back pain.  The pain is somewhat worse when she is up on her feet.  She does not note any definite weakness of the legs.  She has no sensory changes on the arms or body, she denies any change in bladder control.  She has not had any vision changes.  The headaches again have worsened and are severe.  The patient has come off of the Topamax previously several months ago.  She is on maximum doses of oxybutynin and she takes nortriptyline at night and she is having a lot of blurring of vision and dryness of the mouth.  She returns to the office today for an evaluation.  The patient takes Tysabri for her multiple sclerosis.  Past Medical History:  Diagnosis Date  . Abnormality of gait 11/25/2014  . Chronic daily headache 2003   Love  . Depression   . Memory disorder 02/29/2016  . MS (multiple sclerosis) (HCC) 1992   Dr. Sandria Manly  . Multiple sclerosis (HCC) 11/21/2013  . Neurogenic bladder 11/21/2013    Past Surgical History:  Procedure Laterality Date  . ABLATION COLPOCLESIS  2004  . CESAREAN SECTION    . TUBAL LIGATION      Family History  Problem Relation Age of Onset  . Heart failure Father   . Multiple sclerosis Sister   . Cancer Neg Hx   . Diabetes Neg Hx   . Hyperlipidemia Neg Hx   . Hypertension Neg Hx   . Kidney disease Neg Hx   . Stroke Neg Hx     Social history:  reports that she has never smoked. She has never used smokeless tobacco. She reports current  alcohol use. She reports that she does not use drugs.   No Known Allergies  Medications:  Prior to Admission medications   Medication Sig Start Date End Date Taking? Authorizing Provider  baclofen (LIORESAL) 10 MG tablet Take 1 tablet (10 mg total) by mouth at bedtime. 09/24/17  Yes Waldon Merl, PA-C  Erenumab-aooe (AIMOVIG, 140 MG DOSE,) 70 MG/ML SOAJ Inject 140 mg into the skin every 30 (thirty) days. 05/20/18  Yes York Spaniel, MD  gabapentin (NEURONTIN) 300 MG capsule TAKE 1 CAPSULE(300 MG) BY MOUTH TWICE DAILY 10/08/17  Yes York Spaniel, MD  lubiprostone (AMITIZA) 24 MCG capsule Amitiza 24 mcg capsule  TK 1 C PO BID   Yes [provider]  meloxicam (MOBIC) 7.5 MG tablet Take 1 tablet (7.5 mg total) by mouth daily. 09/24/17  Yes Waldon Merl, PA-C  nortriptyline (PAMELOR) 10 MG capsule Take one capsule at night for one week, then take 2 capsules at night for one week, then take 3 capsules at night Patient taking differently: Take 30 mg by mouth at bedtime. Take one capsule at night for one week, then take 2 capsules at night for one week, then take 3 capsules at night 02/27/18  Yes York Spaniel, MD  oxybutynin (DITROPAN-XL) 10 MG 24 hr tablet One  tablet in the morning and 2 in the evening 08/15/18  Yes York Spaniel, MD  phentermine 37.5 MG capsule phentermine 37.5 mg capsule  TK 1 C PO ONCE D IN THE MORNING   Yes [provider]  TYSABRI 300 MG/15ML injection INFUSE 300MG  INTRAVENOUSLY PER PRESCRIBING INFO EVERY 4 WEEKS AS DIRECTED (GIVEN AT PRESCRIBERS OFFICE, DISCARD AFTER 1ST USE) 04/29/18  Yes York Spaniel, MD  valACYclovir (VALTREX) 500 MG tablet Take 1 tablet (500 mg total) by mouth daily. 08/20/17  Yes Waldon Merl, PA-C  topiramate (TOPAMAX) 50 MG tablet TAKE 4 TABLETS(200 MG) BY MOUTH AT BEDTIME Patient not taking: Reported on 08/21/2018 02/11/18   York Spaniel, MD  VESICARE 10 MG tablet TAKE 1 TABLET(10 MG) BY MOUTH  DAILY Patient not taking: Reported on 08/21/2018 10/08/17   York Spaniel, MD    ROS:  Out of a complete 14 system review of symptoms, the patient complains only of the following symptoms, and all other reviewed systems are negative.  Leg pain Headaches  Blood pressure 120/78, pulse 71, height 5' 4.5" (1.638 m), weight 194 lb (88 kg).  Physical Exam  General: The patient is alert and cooperative at the time of the examination.  Neuromuscular: The patient is able to flex to about 110 degrees with a low back.  Skin: No significant peripheral edema is noted.   Neurologic Exam  Mental status: The patient is alert and oriented x 3 at the time of the examination. The patient has apparent normal recent and remote memory, with an apparently normal attention span and concentration ability.   Cranial nerves: Facial symmetry is present. Speech is normal, no aphasia or dysarthria is noted. Extraocular movements are full. Visual fields are full.  Pupils are equal, round, and reactive to light.  Discs are flat bilaterally.  Motor: The patient has good strength in all 4 extremities.  Sensory examination: Soft touch sensation is decreased on the left face, arm, and leg.  Coordination: The patient has good finger-nose-finger and heel-to-shin bilaterally.  Gait and station: The patient has a minimally wide-based gait.  The patient can walk independently, she usually uses a cane.  The patient has some instability with tandem gait.  Romberg is negative.  Reflexes: Deep tendon reflexes are symmetric.   Assessment/Plan:  1.  Multiple sclerosis  2.  New onset bilateral lower extremity discomfort  3.  Migraine headaches, recent exacerbation  4.  Chronic gait disorder  5.  Neurogenic bladder  6.  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  The patient will be given a 6-day  course of prednisone, this hopefully will help her headache as well.  Topamax will be restarted taking 50 mg at night for 2 weeks and then go to 100 mg at night.  The patient will be taken off of oxybutynin and switch to Myrbetriq taking 50 mg daily.  A prescription was sent in for the Topamax.  MRI of the brain and cervical spine will be done, blood work will be done today.  She will follow-up in 5 or 6 months, sooner if needed.  If the Topamax is not effective, the patient may be placed on Ajovy or Aimovig for the headache.  Marlan Palau MD 08/21/2018 2:11 PM  [facility] [address] [place], [place] [address]  Phone [phone] Fax [phone]

## 2018-08-21 NOTE — Patient Instructions (Signed)
We will stop the oxybutinin and start myrbetriq.  We will go on a 6 day course of prednisone.   We will restart the Topamax for the headache, call for any dose adjustments.  Topamax (topiramate) is a seizure medication that has an FDA approval for seizures and for migraine headache. Potential side effects of this medication include weight loss, cognitive slowing, tingling in the fingers and toes, and carbonated drinks will taste bad. If any significant side effects are noted on this drug, please contact our office.

## 2018-08-21 NOTE — Telephone Encounter (Signed)
Pt is asking for call from RN, she is unclear as to what certain medications are for

## 2018-08-21 NOTE — Progress Notes (Signed)
JCV blood work Placed in [facility] for [facility] to pick up.

## 2018-08-22 ENCOUNTER — Telehealth: Payer: Self-pay | Admitting: Neurology

## 2018-08-22 ENCOUNTER — Telehealth: Payer: Self-pay

## 2018-08-22 ENCOUNTER — Other Ambulatory Visit: Payer: Self-pay | Admitting: Neurology

## 2018-08-22 LAB — CBC WITH DIFFERENTIAL/PLATELET
BASOS ABS: 0 10*3/uL (ref 0.0–0.2)
BASOS: 0 %
EOS (ABSOLUTE): 0.1 10*3/uL (ref 0.0–0.4)
Eos: 2 %
HEMOGLOBIN: 11.7 g/dL (ref 11.1–15.9)
Hematocrit: 35.4 % (ref 34.0–46.6)
IMMATURE GRANS (ABS): 0 10*3/uL (ref 0.0–0.1)
IMMATURE GRANULOCYTES: 0 %
Lymphocytes Absolute: 2.6 10*3/uL (ref 0.7–3.1)
Lymphs: 48 %
MCH: 30.2 pg (ref 26.6–33.0)
MCHC: 33.1 g/dL (ref 31.5–35.7)
MCV: 91 fL (ref 79–97)
MONOS ABS: 0.4 10*3/uL (ref 0.1–0.9)
Monocytes: 8 %
NEUTROS ABS: 2.3 10*3/uL (ref 1.4–7.0)
NEUTROS PCT: 42 %
PLATELETS: 189 10*3/uL (ref 150–450)
RBC: 3.88 x10E6/uL (ref 3.77–5.28)
RDW: 13.5 % (ref 11.7–15.4)
WBC: 5.4 10*3/uL (ref 3.4–10.8)

## 2018-08-22 LAB — COMPREHENSIVE METABOLIC PANEL
ALT: 15 IU/L (ref 0–32)
AST: 17 IU/L (ref 0–40)
Albumin/Globulin Ratio: 2.4 — ABNORMAL HIGH (ref 1.2–2.2)
Albumin: 4.5 g/dL (ref 3.5–5.5)
Alkaline Phosphatase: 78 IU/L (ref 39–117)
BUN/Creatinine Ratio: 19 (ref 9–23)
BUN: 20 mg/dL (ref 6–24)
CALCIUM: 9.4 mg/dL (ref 8.7–10.2)
CHLORIDE: 107 mmol/L — AB (ref 96–106)
CO2: 24 mmol/L (ref 20–29)
Creatinine, Ser: 1.04 mg/dL — ABNORMAL HIGH (ref 0.57–1.00)
GFR, EST AFRICAN AMERICAN: 73 mL/min/{1.73_m2} (ref >59)
GFR, EST NON AFRICAN AMERICAN: 63 mL/min/{1.73_m2} (ref >59)
GLOBULIN, TOTAL: 1.9 g/dL (ref 1.5–4.5)
Glucose: 81 mg/dL (ref 65–99)
POTASSIUM: 3.9 mmol/L (ref 3.5–5.2)
SODIUM: 142 mmol/L (ref 134–144)
Total Protein: 6.4 g/dL (ref 6.0–8.5)

## 2018-08-22 NOTE — Telephone Encounter (Signed)
I contacted the pt and reviewed Dr. Anne Hahn instructions for Prednisone; begin taking 6 tablets daily, taper by one tablet daily until off the medication. After reviewing instructions pt verbalized understanding and had no further questions/concerns.

## 2018-08-22 NOTE — Telephone Encounter (Signed)
Spoke to the patient she is aware.  °

## 2018-08-22 NOTE — Telephone Encounter (Signed)
I contacted the pt she stated she wanted to verify prednisone dosage. I reviewed Dr. Anne Hahn instructions for Prednisone; begin taking 6 tablets daily, taper by one tablet daily until off the medication. After reviewing instructions pt verbalized understanding and had no further questions/concerns.

## 2018-08-22 NOTE — Telephone Encounter (Signed)
-----   Message from York Spaniel, MD sent at 08/22/2018  7:25 AM EST ----- Blood work is unremarkable exception of a chronic slight elevation in the creatinine level and chloride level, appears to be stable.  CBC is unremarkable.  JC viral antibody panel is pending.  Please call the patient. ----- Message ----- From: Nell Range Lab Results In Sent: 08/22/2018   5:40 AM EST To: York Spaniel, MD

## 2018-08-22 NOTE — Telephone Encounter (Signed)
Patient stating she would like to be instructed on how to take the steroids that Dr. Anne Hahn gave her.

## 2018-08-22 NOTE — Telephone Encounter (Signed)
Patient notified of lab results. Pt verbalized understanding and had no further questions/concerns at this time.

## 2018-08-22 NOTE — Telephone Encounter (Signed)
Pt called wanting to know that since she is getting schedule for an MRI already she would like to know if she can get one of her lower back as well due to the pain she is experiencing.  Please advise.

## 2018-08-22 NOTE — Telephone Encounter (Signed)
I called the patient.  The patient just started having back and leg pain.  Insurance will not usually cover MRI evaluations of the lumbar spine and less there has been at least 6 weeks of conservative management.  If the back pain persists or worsens, we may be able to get MRI of the low back 6 to 8 weeks from now.

## 2018-08-22 NOTE — Telephone Encounter (Signed)
[employer] Medicare/medicaid order sent to GI, no auth they will reach out to the pt to schedule.

## 2018-08-28 ENCOUNTER — Telehealth: Payer: Self-pay | Admitting: Neurology

## 2018-08-28 DIAGNOSIS — N319 Neuromuscular dysfunction of bladder, unspecified: Secondary | ICD-10-CM

## 2018-08-28 MED ORDER — OXYBUTYNIN CHLORIDE ER 10 MG PO TB24: ORAL_TABLET | ORAL | 1 refills | Status: DC

## 2018-08-28 MED ORDER — PHENYTOIN SODIUM EXTENDED 100 MG PO CAPS: 200.0000 mg | ORAL_CAPSULE | Freq: Every day | ORAL | 3 refills | Status: DC

## 2018-08-28 NOTE — Addendum Note (Signed)
Addended by: York Spaniel on: 08/28/2018 05:15 PM   Modules accepted: Orders

## 2018-08-28 NOTE — Telephone Encounter (Signed)
Patient calling to let Dr Anne Hahn know her face was twitching badly last night. She has been off the medication he gave her for this for awhile. Medication name  Phenton. She is also having problem with Myrbetriq not working as well as Oxybutynin. Please call her.

## 2018-08-28 NOTE — Telephone Encounter (Signed)
I called the patient.  The patient is having increased muscle twitches in the face, I will restart the phenytoin taking 200 mg at night.  The patient will continue on her Topamax for headaches, if she does not get benefit from the Topamax at the 100 mg dose, we will consider Ajovy or Aimovig in the future.  She is still having bladder incontinence on the Myrbetriq, she will stop this, restart the oxybutynin, I will make a referral to a urology doctor.

## 2018-08-30 ENCOUNTER — Telehealth: Payer: Self-pay | Admitting: Neurology

## 2018-08-30 NOTE — Telephone Encounter (Signed)
JC viral antibody is negative, index value of 0.10.

## 2018-09-05 ENCOUNTER — Ambulatory Visit
Admission: RE | Admit: 2018-09-05 | Discharge: 2018-09-05 | Disposition: A | Discharge status: Home / Self Care | Payer: Medicare Other | Source: Ambulatory Visit | Attending: Neurology | Admitting: Neurology

## 2018-09-05 ENCOUNTER — Ambulatory Visit: Payer: Medicare Other | Admitting: Adult Health

## 2018-09-05 DIAGNOSIS — G35 Multiple sclerosis: Secondary | ICD-10-CM

## 2018-09-05 MED ORDER — GADOBENATE DIMEGLUMINE 529 MG/ML IV SOLN
18.0000 mL | Freq: Once | INTRAVENOUS | Status: AC | PRN
  Administered 2018-09-05: 18 mL via INTRAVENOUS

## 2018-09-06 ENCOUNTER — Telehealth: Payer: Self-pay | Admitting: Neurology

## 2018-09-06 NOTE — Telephone Encounter (Signed)
Noted  

## 2018-09-06 NOTE — Telephone Encounter (Signed)
Pt states she had her MRI done on yesterday, she is asking for call with results when available

## 2018-09-08 ENCOUNTER — Other Ambulatory Visit: Payer: Self-pay | Admitting: Neurology

## 2018-09-09 ENCOUNTER — Telehealth: Payer: Self-pay | Admitting: Neurology

## 2018-09-09 MED ORDER — DIVALPROEX SODIUM 500 MG PO DR TAB: DELAYED_RELEASE_TABLET | ORAL | 3 refills | Status: DC

## 2018-09-09 NOTE — Telephone Encounter (Signed)
Pt called on 09/09/18 by Dr. Anne Hahn concerning MRI results. She telephone call for 09/09/18.

## 2018-09-09 NOTE — Telephone Encounter (Signed)
  I called the patient.  The MRI of the brain is stable from April 2019, MRI of the cervical spine is not showing any hyperacute lesions, but there appears to be more involvement of spinal cord as compared to a study done in 2016.  The patient was not on Tysabri at that time, she was on Gilenya.  The patient mainly is concerned about her headaches, she has been on a multitude of medications previously without any benefit, she continues to have daily headaches.  She has been on Botox, she has received several injections without benefit, Aimovig is not helpful.  She has gotten 3 to 4 injections of Aimovig.  She is on Pamelor, baclofen, and Topamax.  Topamax has not helped.  We will stop the Topamax and try Depakote.  MRI brain 09/08/18:  IMPRESSION: This MRI of the brain with and without contrast shows the following: 1.   There are multiple T2/flair hyperintense foci in the pons, cerebellum and hemispheres in a pattern and configuration consistent with chronic demyelinating plaque associated with multiple sclerosis.  None of the foci appears to be acute and they do not enhance.  Compared to the MRI dated 11/22/2017, there is no interval change. 2.   There is a normal enhancement pattern and there are no acute findings.   MRI cervical 09/08/18:  IMPRESSION: This MRI of the cervical spine with and without contrast shows the following: 1.    There are T2 hyperintense foci within the spinal cord as detailed above.  None of these appear to be acute.  They do not enhance. 2.    Minimal degenerative changes at C5-C6 and C6-C7 that do not lead to nerve root compression or spinal stenosis. 3.    There is a normal enhancement pattern.

## 2018-09-17 ENCOUNTER — Encounter: Payer: Self-pay | Admitting: *Deleted

## 2018-09-17 ENCOUNTER — Telehealth: Payer: Self-pay

## 2018-09-17 NOTE — Telephone Encounter (Signed)
Received fax notification from [place] touch prescribing program that patient authorized from 09/17/2018-04/21/2019. Patient enrollment number: [id number]. Account: [facility], site auth number: [id number].

## 2018-09-17 NOTE — Telephone Encounter (Signed)
Copy of authorization given to [facility] with intrafuison.

## 2018-09-17 NOTE — Telephone Encounter (Signed)
Faxed completed/signed tysabri re-auth form to [place] touch prescribing program at [phone]. Received fax confirmation. 

## 2018-10-12 ENCOUNTER — Encounter (HOSPITAL_COMMUNITY): Payer: Self-pay | Admitting: *Deleted

## 2018-10-12 ENCOUNTER — Other Ambulatory Visit: Payer: Self-pay

## 2018-10-12 ENCOUNTER — Ambulatory Visit (HOSPITAL_COMMUNITY)
Admission: EM | Admit: 2018-10-12 | Discharge: 2018-10-12 | Disposition: A | Discharge status: Home / Self Care | Payer: Medicare Other | Attending: Radiology | Admitting: Radiology

## 2018-10-12 DIAGNOSIS — R0981 Nasal congestion: Secondary | ICD-10-CM

## 2018-10-12 MED ORDER — ALBUTEROL SULFATE HFA 108 (90 BASE) MCG/ACT IN AERS: 1.0000 | INHALATION_SPRAY | Freq: Four times a day (QID) | RESPIRATORY_TRACT | 0 refills | Status: DC | PRN

## 2018-10-12 MED ORDER — PREDNISONE 10 MG PO TABS: 40.0000 mg | ORAL_TABLET | Freq: Every day | ORAL | 0 refills | Status: AC

## 2018-10-12 MED ORDER — AMOXICILLIN-POT CLAVULANATE 875-125 MG PO TABS: 1.0000 | ORAL_TABLET | Freq: Two times a day (BID) | ORAL | 0 refills | Status: DC

## 2018-10-12 MED ORDER — BENZONATATE 100 MG PO CAPS: 100.0000 mg | ORAL_CAPSULE | Freq: Three times a day (TID) | ORAL | 0 refills | Status: DC

## 2018-10-12 NOTE — Discharge Instructions (Addendum)
Continue to push fluids and take over the counter medications as directed on the back of the box for symptomatic relief.  ° °

## 2018-10-12 NOTE — ED Provider Notes (Signed)
[facility]    CSN: [id number] Arrival date & time: 10/12/18  1445     History   Chief Complaint Chief Complaint  Patient presents with  . Nasal Congestion    HPI Connie Robertson is a 48 y.o. female.   48 year old female presents with congestion postnasal drip and nonproductive cough x2 weeks.  Patient states that she was previously seen and diagnosed with a sinus infection and given a Z-Pak.  Patient states no relief with the antibiotic reports her cough is persistent and worsening in nature including some wheezing.  Patient denies any fevers nausea vomiting or diarrhea.  Letter at bedside diagnosed with the flu.     Past Medical History:  Diagnosis Date  . Abnormality of gait 11/25/2014  . Chronic daily headache 2003   Love  . Depression   . Memory disorder 02/29/2016  . MS (multiple sclerosis) (HCC) 1992   Dr. Sandria Manly  . Multiple sclerosis (HCC) 11/21/2013  . Neurogenic bladder 11/21/2013    Patient Active Problem List   Diagnosis Date Noted  . 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 Visual disturbance 02/29/2016  . Memory disorder 02/29/2016  . Abnormality of gait 11/25/2014  . Multiple sclerosis (HCC) 11/21/2013  . Neurogenic bladder 11/21/2013  . Headache, tension type, chronic 11/17/2011  . Hot flashes 11/17/2011  . Other screening mammogram 08/18/2011  . Screen for STD (sexually transmitted disease) 08/18/2011    Past Surgical History:  Procedure Laterality Date  . ABLATION COLPOCLESIS  2004  . CESAREAN SECTION    . TUBAL LIGATION      OB History    Gravida  3   Para  2   Term  2   Preterm      AB  1   Living  2     SAB  1   TAB      Ectopic      Multiple      Live Births               Home Medications    Prior to Admission medications   Medication Sig Start Date End Date Taking? Authorizing Provider  AIMOVIG 140 MG/ML SOAJ ADMINISTER 1 ML UNDER THE SKIN EVERY 30 DAYS 09/09/18  Yes York Spaniel, MD  ALPRAZolam Prudy Feeler)  0.5 MG tablet Take 2 tablets approximately 45 minutes prior to the MRI study, take a third tablet if needed. 08/21/18  Yes York Spaniel, MD  baclofen (LIORESAL) 10 MG tablet Take 1 tablet (10 mg total) by mouth at bedtime. 09/24/17  Yes Waldon Merl, PA-C  divalproex (DEPAKOTE) 500 MG DR tablet 1 tablet daily for 2 weeks and then take 1 tablet twice daily 09/09/18  Yes York Spaniel, MD  gabapentin (NEURONTIN) 300 MG capsule TAKE 1 CAPSULE(300 MG) BY MOUTH TWICE DAILY 10/08/17  Yes York Spaniel, MD  lubiprostone (AMITIZA) 24 MCG capsule Amitiza 24 mcg capsule  TK 1 C PO BID   Yes [provider]  meloxicam (MOBIC) 7.5 MG tablet Take 1 tablet (7.5 mg total) by mouth daily. 09/24/17  Yes Waldon Merl, PA-C  nortriptyline (PAMELOR) 10 MG capsule take 3 capsules at night 08/21/18  Yes York Spaniel, MD  oxybutynin (DITROPAN-XL) 10 MG 24 hr tablet 1 tablet in the morning, 2 in the evening 08/28/18  Yes York Spaniel, MD  phenytoin (DILANTIN) 100 MG ER capsule Take 2 capsules (200 mg total) by mouth at bedtime. 08/28/18  Yes Stephanie Acre  K, MD  TYSABRI 300 MG/15ML injection INFUSE 300MG  INTRAVENOUSLY PER PRESCRIBING INFO EVERY 4 WEEKS AS DIRECTED (GIVEN AT PRESCRIBERS OFFICE, DISCARD AFTER 1ST USE) 04/29/18  Yes York Spaniel, MD  valACYclovir (VALTREX) 500 MG tablet Take 1 tablet (500 mg total) by mouth daily. 08/20/17  Yes Marcelline Mates C, PA-C  VESICARE 10 MG tablet TAKE 1 TABLET(10 MG) BY MOUTH DAILY 10/08/17  Yes York Spaniel, MD  phentermine 37.5 MG capsule phentermine 37.5 mg capsule  TK 1 C PO ONCE D IN THE MORNING    [provider]  predniSONE (DELTASONE) 10 MG tablet Begin taking 6 tablets daily, taper by one tablet daily until off the medication. 08/21/18   York Spaniel, MD    Family History Family History  Problem Relation Age of Onset  . Heart failure Father   . Multiple sclerosis Sister   . Cancer Neg Hx   . Diabetes Neg Hx   .  Hyperlipidemia Neg Hx   . Hypertension Neg Hx   . Kidney disease Neg Hx   . Stroke Neg Hx     Social History Social History   Tobacco Use  . Smoking status: Never Smoker  . Smokeless tobacco: Never Used  Substance Use Topics  . Alcohol use: Yes    Frequency: Never    Comment: social  . Drug use: No     Allergies   Patient has no known allergies.   Review of Systems Review of Systems  Constitutional: Negative for chills and fever.  HENT: Positive for congestion and postnasal drip. Negative for ear pain and sore throat.   Eyes: Negative for pain and visual disturbance.  Respiratory: Positive for cough and wheezing. Negative for shortness of breath.   Cardiovascular: Negative for chest pain and palpitations.  Gastrointestinal: Negative for abdominal pain and vomiting.  Genitourinary: Negative for dysuria and hematuria.  Musculoskeletal: Negative for arthralgias and back pain.  Skin: Negative for color change and rash.  Neurological: Negative for seizures and syncope.  All other systems reviewed and are negative.    Physical Exam Triage Vital Signs ED Triage Vitals  Enc Vitals Group     BP 10/12/18 1453 (!) 144/95     Pulse Rate 10/12/18 1453 70     Resp 10/12/18 1453 18     Temp 10/12/18 1453 98.7 F (37.1 C)     Temp Source 10/12/18 1453 Oral     SpO2 10/12/18 1453 99 %     Weight --      Height --      Head Circumference --      Peak Flow --      Pain Score 10/12/18 1454 8     Pain Loc --      Pain Edu? --      Excl. in GC? --    No data found.  Updated Vital Signs BP (!) 144/95   Pulse 70   Temp 98.7 F (37.1 C) (Oral)   Resp 18   SpO2 99%   Visual Acuity Right Eye Distance:   Left Eye Distance:   Bilateral Distance:    Right Eye Near:   Left Eye Near:    Bilateral Near:     Physical Exam Vitals signs and nursing note reviewed.  Constitutional:      Appearance: She is well-developed.  HENT:     Head: Normocephalic and atraumatic.      Nose: Congestion and rhinorrhea present.  Eyes:  Conjunctiva/sclera: Conjunctivae normal.  Neck:     Musculoskeletal: Normal range of motion.  Cardiovascular:     Rate and Rhythm: Normal rate and regular rhythm.     Heart sounds: Normal heart sounds.  Pulmonary:     Effort: Pulmonary effort is normal.     Breath sounds: Normal breath sounds.  Musculoskeletal: Normal range of motion.  Skin:    General: Skin is warm.  Neurological:     Mental Status: She is alert and oriented to person, place, and time.      UC Treatments / Results  Labs (all labs ordered are listed, but only abnormal results are displayed) Labs Reviewed - No data to display  EKG None  Radiology No results found.  Procedures Procedures (including critical care time)  Medications Ordered in UC Medications - No data to display  Initial Impression / Assessment and Plan / UC Course  I have reviewed the triage vital signs and the nursing notes.  Pertinent labs & imaging results that were available during my care of the patient were reviewed by me and considered in my medical decision making (see chart for details).      Final Clinical Impressions(s) / UC Diagnoses   Final diagnoses:  None   Discharge Instructions   None    ED Prescriptions    None     Controlled Substance Prescriptions [place] Controlled Substance Registry consulted? Not Applicable   Alene Mires, NP 10/12/18 1510

## 2018-10-12 NOTE — ED Triage Notes (Signed)
C/O sinus congestion and runny nose x 2 wks; was originally told viral.  C/O continued sxs.  C/O HA and sore throat "from hacking".  Denies fevers.

## 2018-10-23 ENCOUNTER — Telehealth: Payer: Self-pay | Admitting: Neurology

## 2018-10-23 MED ORDER — GABAPENTIN 300 MG PO CAPS: ORAL_CAPSULE | ORAL | 1 refills | Status: DC

## 2018-10-23 NOTE — Telephone Encounter (Signed)
Patient is calling in wanting to know if she can get a early refill on her meds due to virus  oxybutynin (DITROPAN-XL) 10 MG 24 hr tablet  gabapentin (NEURONTIN) 300 MG capsule  phenytoin (DILANTIN) 100 MG ER capsule   [id number] - [place], [place] - [address] AT [facility] [address] [facility] [address] [phone] (Phone) [phone] (Fax)

## 2018-10-23 NOTE — Telephone Encounter (Signed)
pt has called for the intrafusion suite, call transferred °

## 2018-10-23 NOTE — Telephone Encounter (Signed)
I contacted the pt and advised the oxybutynin and dilantin have refills on file at [facility]. I advised I would send an electronic rx for the gabapentin. Pt verbalized understanding and had no further questions.

## 2018-10-28 ENCOUNTER — Telehealth: Payer: Self-pay | Admitting: Neurology

## 2018-10-28 NOTE — Telephone Encounter (Signed)
Verbally spoke with Connie Robertson in intrafuison advised to return pt's call.

## 2018-10-28 NOTE — Telephone Encounter (Signed)
error 

## 2018-10-28 NOTE — Telephone Encounter (Signed)
Pt is wanting to change her infusion appt. I could not transfer. Please call to advise

## 2018-10-30 ENCOUNTER — Telehealth: Payer: Self-pay | Admitting: Neurology

## 2018-10-30 NOTE — Telephone Encounter (Signed)
I called the patient.  Patient claims over the last 3 to 4 days she has had some increased problems with balance, increased fatigue, she has been extremely active otherwise, she may have some problem with physical exhaustion and some increased stress.  No fevers or chills have been noted, no change in the way the bowels and bladder are working.   I will get her in for a visit, we will check blood work and urinalysis at the time.  The patient just had a JC viral antibody that was negative, MRI of the brain was done within the last 4 weeks and was stable.

## 2018-10-30 NOTE — Telephone Encounter (Signed)
I would like to see this patient soon in the office.  She is having recent changes in her clinical condition.

## 2018-10-30 NOTE — Telephone Encounter (Signed)
I called pt and scheduled urgent work in for tomorrow at 930am, check in 900am. Pt declined any sx of coronavirus or being exposed to someone who has it.

## 2018-10-31 ENCOUNTER — Encounter: Payer: Self-pay | Admitting: Neurology

## 2018-10-31 ENCOUNTER — Ambulatory Visit (INDEPENDENT_AMBULATORY_CARE_PROVIDER_SITE_OTHER): Payer: Medicare Other | Admitting: Neurology

## 2018-10-31 ENCOUNTER — Other Ambulatory Visit: Payer: Self-pay

## 2018-10-31 VITALS — BP 122/86 | HR 73 | Ht 64.5 in | Wt 198.3 lb

## 2018-10-31 DIAGNOSIS — R413 Other amnesia: Secondary | ICD-10-CM | POA: Diagnosis not present

## 2018-10-31 DIAGNOSIS — R269 Unspecified abnormalities of gait and mobility: Secondary | ICD-10-CM | POA: Diagnosis not present

## 2018-10-31 DIAGNOSIS — N319 Neuromuscular dysfunction of bladder, unspecified: Secondary | ICD-10-CM

## 2018-10-31 DIAGNOSIS — G35 Multiple sclerosis: Secondary | ICD-10-CM | POA: Diagnosis not present

## 2018-10-31 DIAGNOSIS — G514 Facial myokymia: Secondary | ICD-10-CM | POA: Diagnosis not present

## 2018-10-31 DIAGNOSIS — Z5181 Encounter for therapeutic drug level monitoring: Secondary | ICD-10-CM

## 2018-10-31 NOTE — Progress Notes (Signed)
Reason for visit: Multiple sclerosis, exacerbation  Connie Robertson is an 48 y.o. female  History of present illness:  Connie Robertson is a 48 year old right-handed black female with a history of multiple sclerosis.  The patient is on Tysabri treatments, she missed her treatment in February, but she did get her treatment in early March.  The patient was seen at urgent care on 12 October 2018 with a sinus infection, she was placed on antibiotic therapy.  Over the last 3 to 4 days, she has noted some increased numbness of the left hand, she has felt that the right leg is more heavy than usual, she is having some trouble with walking, she did fall last week.  She has not had any falls more recently.  The patient has a neurogenic bladder, she has not noted any change in bladder function.  She has had some problems with chronic daily headaches that have not responded to medications.  She feels nauseated oftentimes in the morning, her appetite has decreased some.  The patient has not lost weight.  She denies any significant changes in visual symptoms.  She does have some blurring of vision.  She just underwent MRI of the brain in early February 2020 which appear to be stable.  JC viral antibody panel at that time was negative.  The patient reports chronic cognitive issues, memory problems.  Past Medical History:  Diagnosis Date  . Abnormality of gait 11/25/2014  . Chronic daily headache 2003   Love  . Depression   . Memory disorder 02/29/2016  . MS (multiple sclerosis) (HCC) 1992   Dr. Sandria Manly  . Multiple sclerosis (HCC) 11/21/2013  . Neurogenic bladder 11/21/2013    Past Surgical History:  Procedure Laterality Date  . ABLATION COLPOCLESIS  2004  . CESAREAN SECTION    . TUBAL LIGATION      Family History  Problem Relation Age of Onset  . Heart failure Father   . Multiple sclerosis Sister   . Cancer Neg Hx   . Diabetes Neg Hx   . Hyperlipidemia Neg Hx   . Hypertension Neg Hx   . Kidney  disease Neg Hx   . Stroke Neg Hx     Social history:  reports that she has never smoked. She has never used smokeless tobacco. She reports current alcohol use. She reports that she does not use drugs.   No Known Allergies  Medications:  Prior to Admission medications   Medication Sig Start Date End Date Taking? Authorizing Provider  AIMOVIG 140 MG/ML SOAJ ADMINISTER 1 ML UNDER THE SKIN EVERY 30 DAYS 09/09/18  Yes York Spaniel, MD  albuterol (PROVENTIL HFA;VENTOLIN HFA) 108 (90 Base) MCG/ACT inhaler Inhale 1-2 puffs into the lungs every 6 (six) hours as needed for wheezing or shortness of breath. 10/12/18  Yes Alene Mires, NP  ALPRAZolam Prudy Feeler) 0.5 MG tablet Take 2 tablets approximately 45 minutes prior to the MRI study, take a third tablet if needed. 08/21/18  Yes York Spaniel, MD  baclofen (LIORESAL) 10 MG tablet Take 1 tablet (10 mg total) by mouth at bedtime. 09/24/17  Yes Waldon Merl, PA-C  divalproex (DEPAKOTE) 500 MG DR tablet 1 tablet daily for 2 weeks and then take 1 tablet twice daily 09/09/18  Yes York Spaniel, MD  gabapentin (NEURONTIN) 300 MG capsule TAKE 1 CAPSULE(300 MG) BY MOUTH TWICE DAILY 10/23/18  Yes York Spaniel, MD  lubiprostone (AMITIZA) 24 MCG capsule Amitiza 24 mcg capsule  TK 1 C PO BID   Yes [provider]  meloxicam (MOBIC) 7.5 MG tablet Take 1 tablet (7.5 mg total) by mouth daily. 09/24/17  Yes Waldon Merl, PA-C  nortriptyline (PAMELOR) 10 MG capsule take 3 capsules at night 08/21/18  Yes York Spaniel, MD  oxybutynin (DITROPAN-XL) 10 MG 24 hr tablet 1 tablet in the morning, 2 in the evening 08/28/18  Yes York Spaniel, MD  phenytoin (DILANTIN) 100 MG ER capsule Take 2 capsules (200 mg total) by mouth at bedtime. 08/28/18  Yes York Spaniel, MD  TYSABRI 300 MG/15ML injection INFUSE 300MG  INTRAVENOUSLY PER PRESCRIBING INFO EVERY 4 WEEKS AS DIRECTED (GIVEN AT PRESCRIBERS OFFICE, DISCARD AFTER 1ST USE) 04/29/18  Yes  York Spaniel, MD  valACYclovir (VALTREX) 500 MG tablet Take 1 tablet (500 mg total) by mouth daily. 08/20/17  Yes Waldon Merl, PA-C  VESICARE 10 MG tablet TAKE 1 TABLET(10 MG) BY MOUTH DAILY 10/08/17  Yes York Spaniel, MD    ROS:  Out of a complete 14 system review of symptoms, the patient complains only of the following symptoms, and all other reviewed systems are negative.  Leg weakness Numbness Walking problems  Blood pressure 122/86, pulse 73, height 5' 4.5" (1.638 m), weight 198 lb 5 oz (90 kg).  Physical Exam  General: The patient is alert and cooperative at the time of the examination.  Skin: No significant peripheral edema is noted.   Neurologic Exam  Mental status: The patient is alert and oriented x 3 at the time of the examination. The patient has apparent normal recent and remote memory, with an apparently normal attention span and concentration ability.   Cranial nerves: Facial symmetry is present. Speech is normal, no aphasia or dysarthria is noted. Extraocular movements are full. Visual fields are full.  Pupils are equal, round, and reactive to light.  Discs are flat bilaterally.  There may be some mild optic atrophy bilaterally.  Motor: The patient has good strength in all 4 extremities, with exception of slight weakness with hip flexion bilaterally symmetric from one side to the next.  Sensory examination: Soft touch sensation is decreased on the left face, arm, and leg.  Coordination: The patient has good finger-nose-finger and heel-to-shin bilaterally.  Gait and station: The patient has a slightly wide-based gait.  The patient to walk independently, she does have a cane to use if needed.  Tandem gait is slightly unsteady.  Romberg is negative.  Reflexes: Deep tendon reflexes are symmetric.   MRI brain 09/08/18:  IMPRESSION: This MRI of the brain with and without contrast shows the following: 1.   There are multiple T2/flair hyperintense foci in  the pons, cerebellum and hemispheres in a pattern and configuration consistent with chronic demyelinating plaque associated with multiple sclerosis.  None of the foci appears to be acute and they do not enhance.  Compared to the MRI dated 11/22/2017, there is no interval change. 2.   There is a normal enhancement pattern and there are no acute findings.  * MRI scan images were reviewed online. I agree with the written report.    Assessment/Plan:  1.  Multiple sclerosis with mild exacerbation  2.  Neurogenic bladder  3.  Mild gait disorder  4.  Left hemisensory deficit  The patient reports some mild mainly subjective changes in her clinical condition.  Her clinical examination objectively appears to be very similar to what it was in February.  We will check blood work  and urinalysis today, if her symptoms worsen over time, we may consider a prednisone taper at that time.  The patient will get her next Tysabri treatment next week if the blood work and urinalysis appear to be unremarkable.  She has not been running any fevers.  I have indicated that with the COVID-19 outbreak, she needs to be very careful about any exposure to other individuals, she is immunosuppressed on the Tysabri.  Marlan Palau MD 10/31/2018 9:35 AM  [facility] [address] [place], [place] [address]  Phone [phone] Fax [phone]

## 2018-10-31 NOTE — Progress Notes (Signed)
JCV antibody blood test placed in out box for quest pick up at [facility]

## 2018-11-01 ENCOUNTER — Telehealth: Payer: Self-pay | Admitting: Neurology

## 2018-11-01 LAB — URINALYSIS, ROUTINE W REFLEX MICROSCOPIC
Bilirubin, UA: NEGATIVE
Glucose, UA: NEGATIVE
KETONES UA: NEGATIVE
NITRITE UA: POSITIVE — AB
Protein, UA: NEGATIVE
RBC, UA: NEGATIVE
Specific Gravity, UA: 1.02 (ref 1.005–1.030)
UUROB: 0.2 mg/dL (ref 0.2–1.0)
pH, UA: 7.5 (ref 5.0–7.5)

## 2018-11-01 LAB — CBC WITH DIFFERENTIAL/PLATELET
BASOS: 0 %
Basophils Absolute: 0 10*3/uL (ref 0.0–0.2)
EOS (ABSOLUTE): 0.1 10*3/uL (ref 0.0–0.4)
Eos: 4 %
HEMATOCRIT: 35.6 % (ref 34.0–46.6)
HEMOGLOBIN: 12.2 g/dL (ref 11.1–15.9)
Immature Grans (Abs): 0 10*3/uL (ref 0.0–0.1)
Immature Granulocytes: 0 %
Lymphocytes Absolute: 1.8 10*3/uL (ref 0.7–3.1)
Lymphs: 47 %
MCH: 31 pg (ref 26.6–33.0)
MCHC: 34.3 g/dL (ref 31.5–35.7)
MCV: 91 fL (ref 79–97)
Monocytes Absolute: 0.3 10*3/uL (ref 0.1–0.9)
Monocytes: 8 %
Neutrophils Absolute: 1.6 10*3/uL (ref 1.4–7.0)
Neutrophils: 41 %
Platelets: 164 10*3/uL (ref 150–450)
RBC: 3.93 x10E6/uL (ref 3.77–5.28)
RDW: 13.3 % (ref 11.7–15.4)
WBC: 3.9 10*3/uL (ref 3.4–10.8)

## 2018-11-01 LAB — COMPREHENSIVE METABOLIC PANEL
ALT: 22 IU/L (ref 0–32)
AST: 20 IU/L (ref 0–40)
Albumin/Globulin Ratio: 2.1 (ref 1.2–2.2)
Albumin: 4.4 g/dL (ref 3.8–4.8)
Alkaline Phosphatase: 73 IU/L (ref 39–117)
BUN/Creatinine Ratio: 17 (ref 9–23)
BUN: 15 mg/dL (ref 6–24)
Bilirubin Total: <0.2 mg/dL (ref 0.0–1.2)
CALCIUM: 9.3 mg/dL (ref 8.7–10.2)
CO2: 27 mmol/L (ref 20–29)
Chloride: 102 mmol/L (ref 96–106)
Creatinine, Ser: 0.9 mg/dL (ref 0.57–1.00)
GFR calc Af Amer: 87 mL/min/{1.73_m2} (ref >59)
GFR calc non Af Amer: 75 mL/min/{1.73_m2} (ref >59)
Globulin, Total: 2.1 g/dL (ref 1.5–4.5)
Glucose: 82 mg/dL (ref 65–99)
Potassium: 4.5 mmol/L (ref 3.5–5.2)
Sodium: 142 mmol/L (ref 134–144)
Total Protein: 6.5 g/dL (ref 6.0–8.5)

## 2018-11-01 LAB — MICROSCOPIC EXAMINATION: Casts: NONE SEEN /lpf

## 2018-11-01 LAB — AMMONIA: Ammonia: 45 ug/dL (ref 31–155)

## 2018-11-01 LAB — PHENYTOIN LEVEL, TOTAL: Phenytoin (Dilantin), Serum: 4.3 ug/mL — ABNORMAL LOW (ref 10.0–20.0)

## 2018-11-01 LAB — SEDIMENTATION RATE: Sed Rate: 2 mm/hr (ref 0–32)

## 2018-11-01 LAB — VALPROIC ACID LEVEL: Valproic Acid Lvl: 36 ug/mL — ABNORMAL LOW (ref 50–100)

## 2018-11-01 MED ORDER — SULFAMETHOXAZOLE-TRIMETHOPRIM 400-80 MG PO TABS: 1.0000 | ORAL_TABLET | Freq: Two times a day (BID) | ORAL | 0 refills | Status: DC

## 2018-11-01 NOTE — Telephone Encounter (Signed)
I called the patient.  Urinalysis back and appears to be consistent with urinary tract infection, I will send in a 6-day course of Bactrim.  Blood work is not back yet, in the computer it does not appear they have started analyzing.

## 2018-11-04 ENCOUNTER — Telehealth: Payer: Self-pay

## 2018-11-04 ENCOUNTER — Telehealth: Payer: Self-pay | Admitting: Neurology

## 2018-11-04 NOTE — Telephone Encounter (Signed)
JC viral antibody panel was negative, index value of 0.14.

## 2018-11-04 NOTE — Telephone Encounter (Signed)
I contacted the pt and advised of results. Pt verbalized understanding and advised we are still waiting for the JCV antibody result to be received.

## 2018-11-04 NOTE — Telephone Encounter (Signed)
JCV Anit Body Result received. Result 0.14 for index value and JCV antibody negative.

## 2018-11-04 NOTE — Telephone Encounter (Signed)
-----   Message from York Spaniel, MD sent at 11/04/2018  7:13 AM EDT ----- Please call the patient,Blood work is unremarkable, Dilantin level and Depakote level are not in the toxic range.  No dose adjustments. ----- Message ----- From: [facility] Lab Results In Sent: 11/01/2018   5:36 AM EDT To: York Spaniel, MD

## 2018-11-14 ENCOUNTER — Other Ambulatory Visit: Payer: Self-pay | Admitting: Neurology

## 2018-12-10 ENCOUNTER — Other Ambulatory Visit: Payer: Self-pay | Admitting: Neurology

## 2019-01-08 ENCOUNTER — Other Ambulatory Visit: Payer: Self-pay | Admitting: Physician Assistant

## 2019-01-24 ENCOUNTER — Other Ambulatory Visit: Payer: Self-pay | Admitting: Neurology

## 2019-02-04 ENCOUNTER — Other Ambulatory Visit: Payer: Self-pay | Admitting: Neurology

## 2019-03-27 ENCOUNTER — Telehealth: Payer: Self-pay | Admitting: *Deleted

## 2019-03-27 NOTE — Telephone Encounter (Signed)
Faxed completed/signed Tysabri pt status report and reauth questionnaire to [employer] at [phone]. Received confirmation.  

## 2019-03-27 NOTE — Telephone Encounter (Signed)
Received fax notification from touch prescribing program that pt re-authorized for Tysabri from 03/27/19-10/21/2019. Patient enrollment number: [id number]. Account: [employer]. Site auth number: [id number].

## 2019-04-06 ENCOUNTER — Other Ambulatory Visit: Payer: Self-pay | Admitting: Neurology

## 2019-04-23 ENCOUNTER — Other Ambulatory Visit: Payer: Self-pay | Admitting: Neurology

## 2019-05-02 ENCOUNTER — Other Ambulatory Visit: Payer: Self-pay | Admitting: Neurology

## 2019-05-05 ENCOUNTER — Other Ambulatory Visit: Payer: Self-pay | Admitting: Neurology

## 2019-05-12 IMAGING — DX DG LUMBAR SPINE COMPLETE 4+V
5 series · 5 of 5 positions shown · non-contrast
Comparison: None.

CLINICAL DATA: Low back pain after fall 8 days ago.

EXAM:
LUMBAR SPINE - COMPLETE 4+ VIEW

[lumbar spine ap]
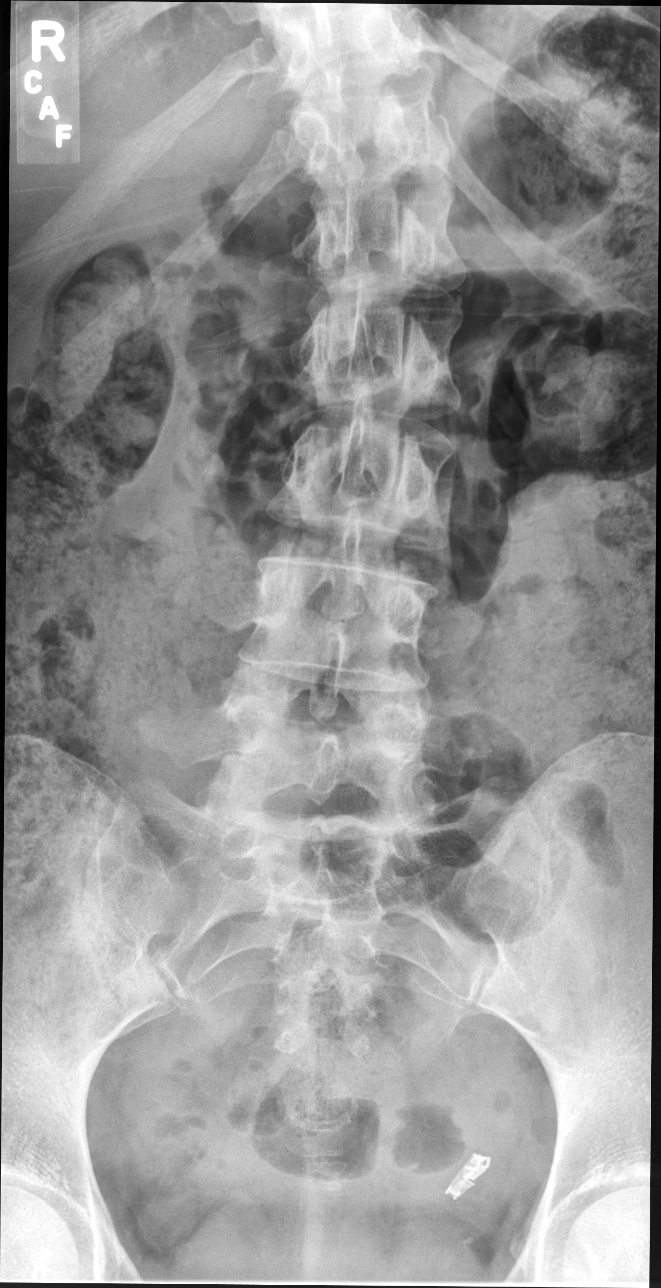

[lumbar spine oblique (1 of 2)]
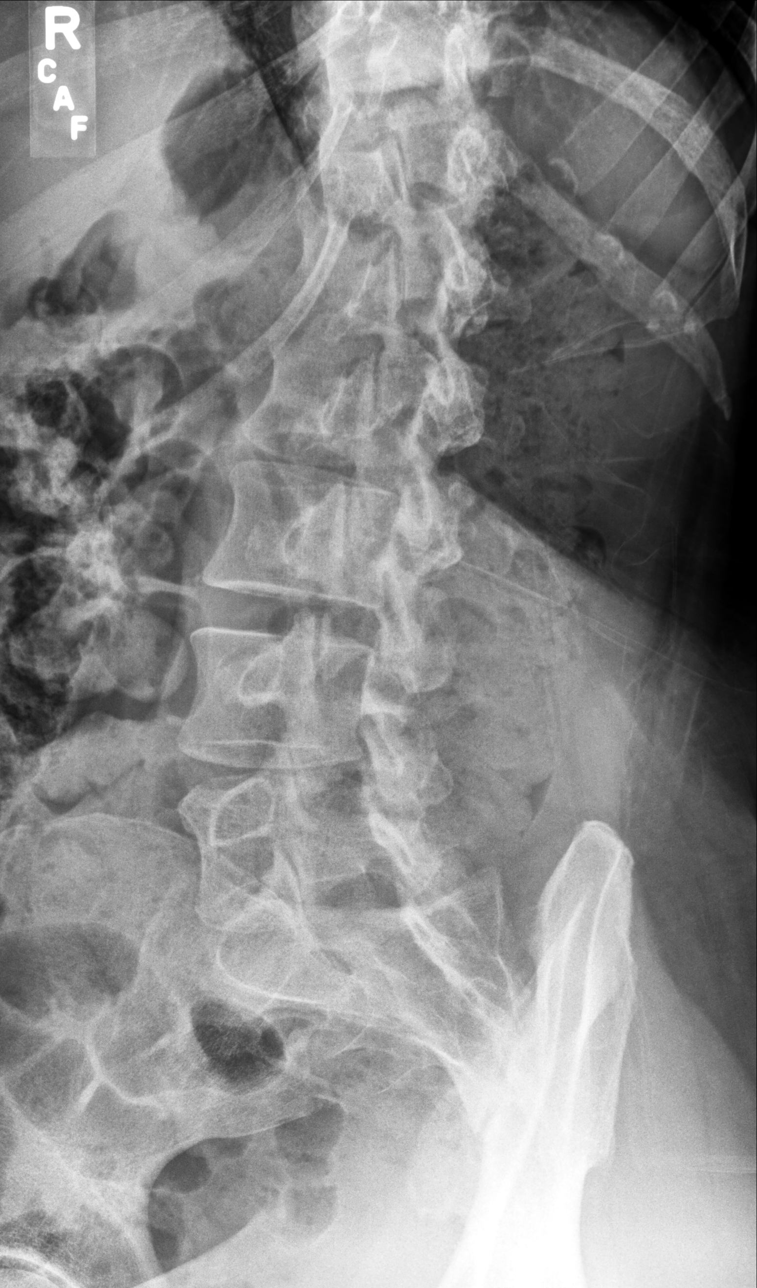

[lumbar spine oblique (2 of 2)]
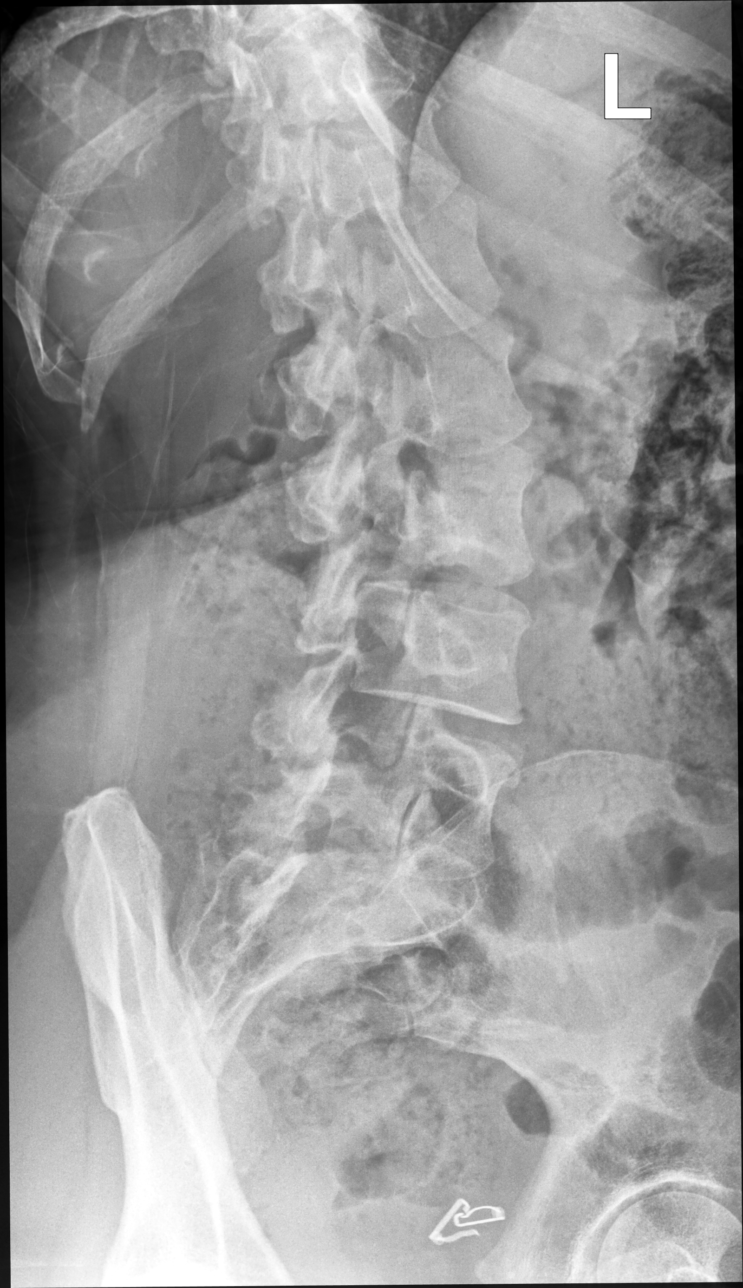

[lumbar spine lat (1 of 2)]
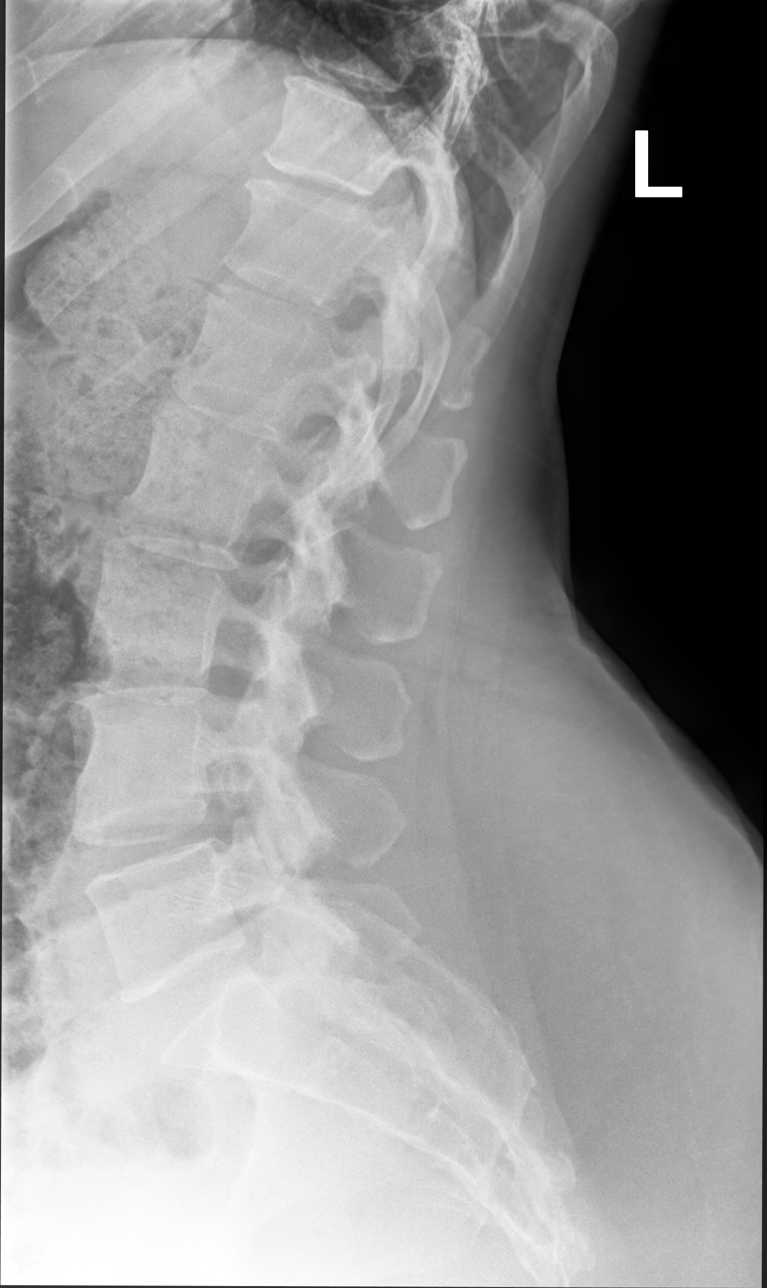

[lumbar spine lat (2 of 2)]
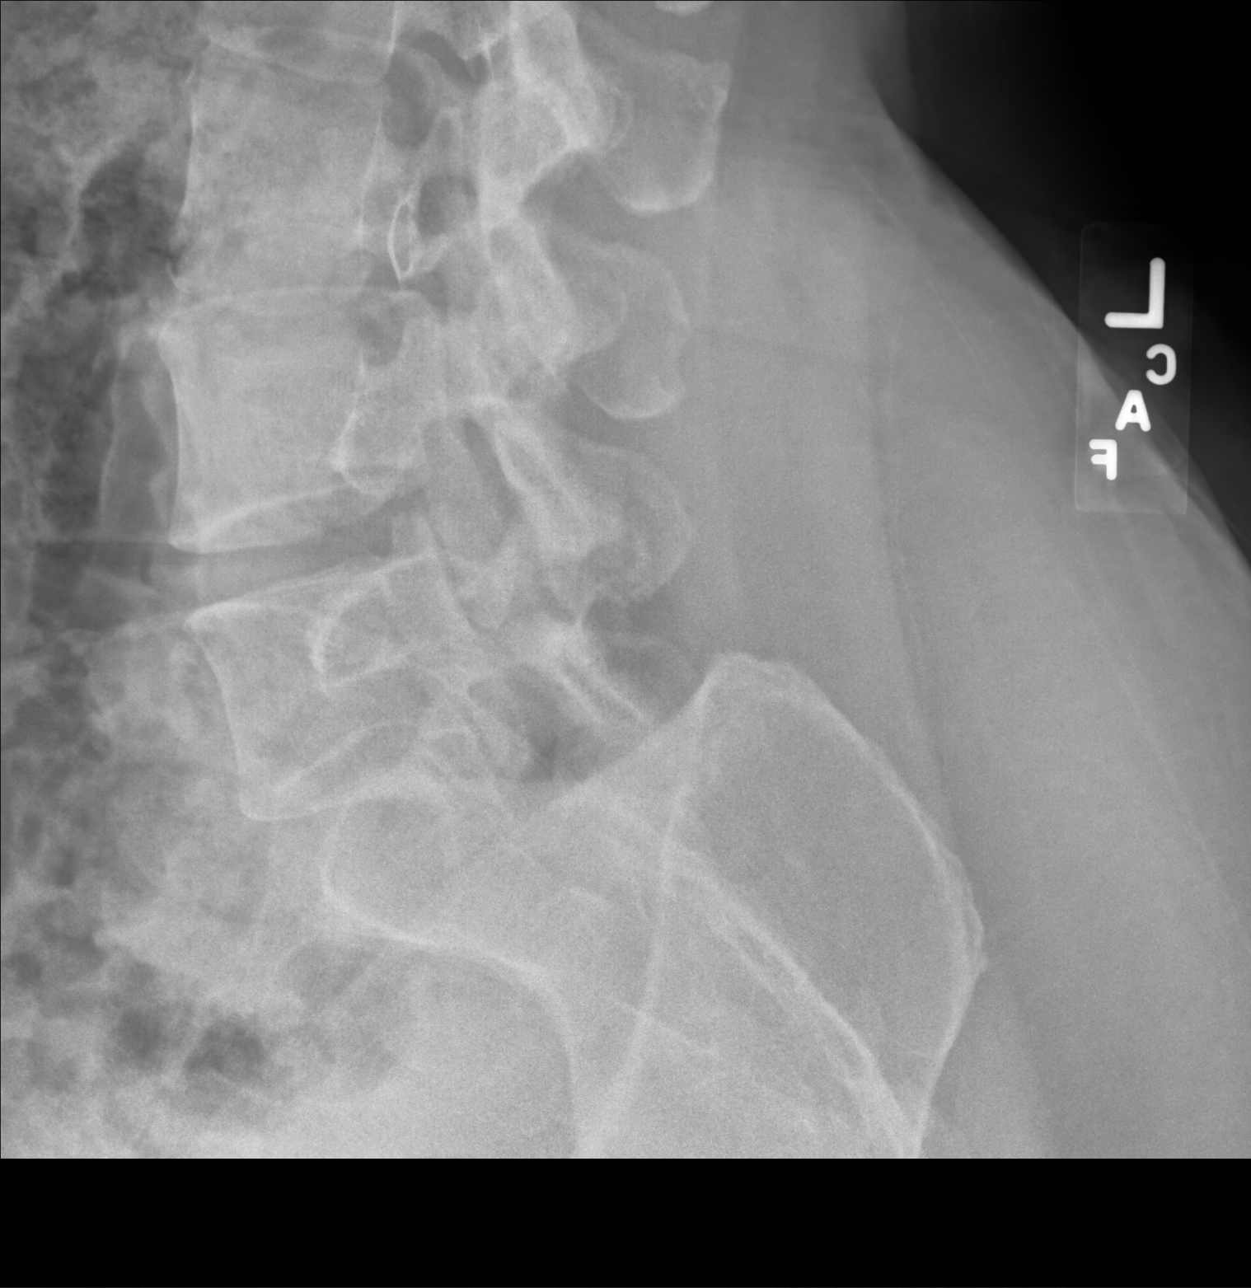

[5 of 5 positions shown; findings below may reference images not displayed]

FINDINGS: Five non rib-bearing lumbar-type vertebral bodies are intact and
aligned with maintenance of the lumbar lordosis. Broad
levoscoliosis. Intervertebral disc heights are normal. Mild thoracic
spondylosis. No destructive bony lesions.

Sacroiliac joints are symmetric. Included prevertebral and
paraspinal soft tissue planes are non-suspicious. Tubal ligation
clips in LEFT pelvis. Moderate volume retained large bowel stool.
IMPRESSION: 1. No fracture deformity or malalignment.  Mild broad levoscoliosis.
2. Migrated RIGHT tubal ligation clip.
3. Moderate amount of retained large bowel stool.

## 2019-05-22 ENCOUNTER — Other Ambulatory Visit: Payer: Self-pay

## 2019-05-22 ENCOUNTER — Telehealth: Payer: Self-pay | Admitting: Neurology

## 2019-05-22 ENCOUNTER — Telehealth: Payer: Self-pay

## 2019-05-22 ENCOUNTER — Other Ambulatory Visit: Payer: Self-pay | Admitting: Neurology

## 2019-05-22 DIAGNOSIS — Z5181 Encounter for therapeutic drug level monitoring: Secondary | ICD-10-CM

## 2019-05-22 DIAGNOSIS — G44221 Chronic tension-type headache, intractable: Secondary | ICD-10-CM

## 2019-05-22 DIAGNOSIS — R350 Frequency of micturition: Secondary | ICD-10-CM

## 2019-05-22 DIAGNOSIS — G35 Multiple sclerosis: Secondary | ICD-10-CM

## 2019-05-22 NOTE — Progress Notes (Signed)
The patient reports a 1 month history of some decline in her ability to ambulate, she has not had any falls but feels somewhat heavy with the lower extremities.  She denies any urinary frequency or fevers or chills.  She is on Tysabri.  We will check blood work and urinalysis today, I will set her up for MRI of the brain.

## 2019-05-22 NOTE — Telephone Encounter (Signed)
Pt came to the clinic today for a Tysabri infusion. Before pt started her infusion, she advised Corrie Dandy that she has been struggling with leg heaviness over the past 4 weeks. I talked with Dr. Jannifer Franklin and he decided to check JCV, UA, CMP and CBC.   JCV has been placed in [facility] pick up box for processing.

## 2019-05-22 NOTE — Telephone Encounter (Signed)
[employer] medicare/medicaid order sent to [facility]. No auth they will reach out to the patient to schedule.  °

## 2019-05-23 LAB — CBC WITH DIFFERENTIAL/PLATELET
Basophils Absolute: 0 10*3/uL (ref 0.0–0.2)
Basos: 0 %
EOS (ABSOLUTE): 0.1 10*3/uL (ref 0.0–0.4)
Eos: 2 %
Hematocrit: 35.8 % (ref 34.0–46.6)
Hemoglobin: 11.6 g/dL (ref 11.1–15.9)
Immature Grans (Abs): 0 10*3/uL (ref 0.0–0.1)
Immature Granulocytes: 0 %
Lymphocytes Absolute: 2.5 10*3/uL (ref 0.7–3.1)
Lymphs: 45 %
MCH: 30.8 pg (ref 26.6–33.0)
MCHC: 32.4 g/dL (ref 31.5–35.7)
MCV: 95 fL (ref 79–97)
Monocytes Absolute: 0.3 10*3/uL (ref 0.1–0.9)
Monocytes: 6 %
NRBC: 1 % — ABNORMAL HIGH (ref 0–0)
Neutrophils Absolute: 2.6 10*3/uL (ref 1.4–7.0)
Neutrophils: 47 %
Platelets: 172 10*3/uL (ref 150–450)
RBC: 3.77 x10E6/uL (ref 3.77–5.28)
RDW: 14.5 % (ref 11.7–15.4)
WBC: 5.6 10*3/uL (ref 3.4–10.8)

## 2019-05-23 LAB — COMPREHENSIVE METABOLIC PANEL
ALT: 24 IU/L (ref 0–32)
AST: 21 IU/L (ref 0–40)
Albumin/Globulin Ratio: 2.3 — ABNORMAL HIGH (ref 1.2–2.2)
Albumin: 4.4 g/dL (ref 3.8–4.8)
Alkaline Phosphatase: 81 IU/L (ref 39–117)
BUN/Creatinine Ratio: 14 (ref 9–23)
BUN: 15 mg/dL (ref 6–24)
Bilirubin Total: 0.2 mg/dL (ref 0.0–1.2)
CO2: 23 mmol/L (ref 20–29)
Calcium: 9 mg/dL (ref 8.7–10.2)
Chloride: 109 mmol/L — ABNORMAL HIGH (ref 96–106)
Creatinine, Ser: 1.07 mg/dL — ABNORMAL HIGH (ref 0.57–1.00)
GFR calc Af Amer: 70 mL/min/{1.73_m2} (ref >59)
GFR calc non Af Amer: 61 mL/min/{1.73_m2} (ref >59)
Globulin, Total: 1.9 g/dL (ref 1.5–4.5)
Glucose: 70 mg/dL (ref 65–99)
Potassium: 4 mmol/L (ref 3.5–5.2)
Sodium: 144 mmol/L (ref 134–144)
Total Protein: 6.3 g/dL (ref 6.0–8.5)

## 2019-05-26 ENCOUNTER — Telehealth: Payer: Self-pay

## 2019-05-26 ENCOUNTER — Other Ambulatory Visit (INDEPENDENT_AMBULATORY_CARE_PROVIDER_SITE_OTHER): Payer: Self-pay

## 2019-05-26 ENCOUNTER — Other Ambulatory Visit: Payer: Self-pay

## 2019-05-26 DIAGNOSIS — Z0289 Encounter for other administrative examinations: Secondary | ICD-10-CM

## 2019-05-26 DIAGNOSIS — R350 Frequency of micturition: Secondary | ICD-10-CM

## 2019-05-26 DIAGNOSIS — Z5181 Encounter for therapeutic drug level monitoring: Secondary | ICD-10-CM

## 2019-05-26 DIAGNOSIS — G35 Multiple sclerosis: Secondary | ICD-10-CM

## 2019-05-26 MED ORDER — ALPRAZOLAM 0.5 MG PO TABS: ORAL_TABLET | ORAL | 0 refills | Status: DC

## 2019-05-26 NOTE — Telephone Encounter (Signed)
I will call in prescription for alprazolam.

## 2019-05-26 NOTE — Telephone Encounter (Addendum)
Pt notified xanax has been submitted to [facility]

## 2019-05-26 NOTE — Telephone Encounter (Signed)
Pt called again wanting to know if something has been called in for her to take for the MRI tomorrow morning. Please advise.

## 2019-05-26 NOTE — Addendum Note (Signed)
Addended by: Kathrynn Ducking on: 05/26/2019 02:19 PM   Modules accepted: Orders

## 2019-05-26 NOTE — Telephone Encounter (Signed)
Patient called and stated that she would need something to take for the MRI that she is having in the morning and something for her muscle spasms as well. Please advise

## 2019-05-27 ENCOUNTER — Ambulatory Visit
Admission: RE | Admit: 2019-05-27 | Discharge: 2019-05-27 | Disposition: A | Discharge status: Home / Self Care | Payer: Medicare Other | Source: Ambulatory Visit | Attending: Neurology | Admitting: Neurology

## 2019-05-27 DIAGNOSIS — G35 Multiple sclerosis: Secondary | ICD-10-CM

## 2019-05-27 LAB — URINALYSIS, ROUTINE W REFLEX MICROSCOPIC
Bilirubin, UA: NEGATIVE
Glucose, UA: NEGATIVE
Ketones, UA: NEGATIVE
Nitrite, UA: NEGATIVE
Protein,UA: NEGATIVE
RBC, UA: NEGATIVE
Specific Gravity, UA: 1.016 (ref 1.005–1.030)
Urobilinogen, Ur: 0.2 mg/dL (ref 0.2–1.0)
pH, UA: 7 (ref 5.0–7.5)

## 2019-05-27 LAB — MICROSCOPIC EXAMINATION: Casts: NONE SEEN /lpf

## 2019-05-27 MED ORDER — GADOBENATE DIMEGLUMINE 529 MG/ML IV SOLN
17.0000 mL | Freq: Once | INTRAVENOUS | Status: AC | PRN
  Administered 2019-05-27: 17 mL via INTRAVENOUS

## 2019-05-29 ENCOUNTER — Telehealth: Payer: Self-pay | Admitting: Neurology

## 2019-05-29 NOTE — Telephone Encounter (Signed)
  I called the patient.  MRI of the brain is stable, patient still feels that the legs are heavy.  The patient apparently does not have revisit scheduled, will try to get something set up to see her and evaluate her change in walking.  MRI brain 05/29/19:  IMPRESSION:   MRI brain (with and without) demonstrating: -Multiple supratentorial and infratentorial chronic demyelinating plaques.  -No acute plaques. No change from 09/05/2018.

## 2019-06-02 ENCOUNTER — Telehealth: Payer: Self-pay | Admitting: Neurology

## 2019-06-02 NOTE — Telephone Encounter (Signed)
Pt has been scheduled for 06/16/2019 at 1130 with AL, NP advised check in time would be 11.

## 2019-06-02 NOTE — Telephone Encounter (Signed)
JC virus antibody was negative, index value of 0.23.

## 2019-06-15 DIAGNOSIS — K5900 Constipation, unspecified: Secondary | ICD-10-CM | POA: Insufficient documentation

## 2019-06-15 NOTE — Progress Notes (Signed)
PATIENT: Connie Robertson DOB: [birth year]  REASON FOR VISIT: follow up HISTORY FROM: patient  Chief Complaint  Patient presents with   Follow-up    8 mon f/u. Alone. Rm 1. Patient mentioned that her legs still feel heavy. She stated it feels like she has weights on them at all times. She stated that she is also still having migraines.      HISTORY OF PRESENT ILLNESS: Today 06/16/19 Connie Robertson is a 48 y.o. female here today for follow up. She continues Tysabri infusions monthly. Next infusion this week. She does continue to note a heaviness in her legs bilaterally. Heaviness is generalized and the same bilaterally. No weakness. No new numbness. She reports that she exercises daily. She walks about 2 miles in the mornings at least 5 days a week. Often she walks again in the afternoons. She did have a trip up steps about a month ago. No injuries. MR brain stable last month. JCV negatine in 05/2019. Dilantin for hemifacial spasms. Depakote, Amovig and Pamelor (intermittently) for chronic tension headaches. She has some degree of a headache everyday. She does have some light and sound sensitivity as well as nausea. Sleep makes headaches better. Stress is a trigger. She is drinking about 3-4 bottles of water a day. Oxybutynin for frequency d/t neurogenic bladder and seems to help. Amitiza as needed for chronic constipation.   HISTORY: (copied from Dr Anne HahnWillis' note on 10/31/2018)  [name] is a [age] year old right-handed black female with a history of multiple sclerosis.  The patient is on Tysabri treatments, she missed her treatment in February, but she did get her treatment in early March.  The patient was seen at urgent care on 12 October 2018 with a sinus infection, she was placed on antibiotic therapy.  Over the last 3 to 4 days, she has noted some increased numbness of the left hand, she has felt that the right leg is more heavy than usual, she is having some trouble with walking, she  did fall last week.  She has not had any falls more recently.  The patient has a neurogenic bladder, she has not noted any change in bladder function.  She has had some problems with chronic daily headaches that have not responded to medications.  She feels nauseated oftentimes in the morning, her appetite has decreased some.  The patient has not lost weight.  She denies any significant changes in visual symptoms.  She does have some blurring of vision.  She just underwent MRI of the brain in early February 2020 which appear to be stable.  JC viral antibody panel at that time was negative.  The patient reports chronic cognitive issues, memory problems.   REVIEW OF SYSTEMS: Out of a complete 14 system review of symptoms, the patient complains only of the following symptoms, headaches and all other reviewed systems are negative.  ALLERGIES: No Known Allergies  HOME MEDICATIONS: Outpatient Medications Prior to Visit  Medication Sig Dispense Refill   AIMOVIG 140 MG/ML SOAJ ADMINISTER 1 ML UNDER THE SKIN EVERY 30 DAYS 1 mL 2   albuterol (PROVENTIL HFA;VENTOLIN HFA) 108 (90 Base) MCG/ACT inhaler Inhale 1-2 puffs into the lungs every 6 (six) hours as needed for wheezing or shortness of breath. 1 Inhaler 0   baclofen (LIORESAL) 10 MG tablet Take 1 tablet (10 mg total) by mouth at bedtime. 15 each 0   divalproex (DEPAKOTE) 500 MG DR tablet 1 TABLET BY MOUTH TWICE DAILY 180 tablet 1   gabapentin (  NEURONTIN) 300 MG capsule TAKE 1 CAPSULE(300 MG) BY MOUTH TWICE DAILY 180 capsule 1   lubiprostone (AMITIZA) 24 MCG capsule Amitiza 24 mcg capsule  TK 1 C PO BID     meloxicam (MOBIC) 7.5 MG tablet Take 1 tablet (7.5 mg total) by mouth daily. 15 tablet 0   nortriptyline (PAMELOR) 10 MG capsule take 3 capsules at night 90 capsule 3   oxybutynin (DITROPAN-XL) 10 MG 24 hr tablet TAKE 1 TABLET BY MOUTH EVERY MORNING AND 2 TABLETS EVERY EVENING 270 tablet 1   phenytoin (DILANTIN) 100 MG ER capsule TAKE 2  CAPSULES(200 MG) BY MOUTH AT BEDTIME 60 capsule 3   sulfamethoxazole-trimethoprim (BACTRIM) 400-80 MG tablet Take 1 tablet by mouth 2 (two) times daily. 12 tablet 0   TYSABRI 300 MG/15ML injection INFUSE 300MG  INTRAVENOUSLY PER PRESCRIBING INFO EVERY 4 WEEKS AS DIRECTED (GIVEN AT PRESCRIBERS OFFICE, DISCARD AFTER 1ST USE) 15 mL 3   valACYclovir (VALTREX) 500 MG tablet Take 1 tablet (500 mg total) by mouth daily. 30 tablet 6   VESICARE 10 MG tablet TAKE 1 TABLET(10 MG) BY MOUTH DAILY 90 tablet 3   ALPRAZolam (XANAX) 0.5 MG tablet Take 2 tablets approximately 45 minutes prior to the MRI study, take a third tablet if needed. 3 tablet 0   No facility-administered medications prior to visit.     PAST MEDICAL HISTORY: Past Medical History:  Diagnosis Date   Abnormality of gait 11/25/2014   Chronic daily headache 2003   Love   Depression    Memory disorder 02/29/2016   MS (multiple sclerosis) (HCC) 1992   Dr. Erling Cruz   Multiple sclerosis ([place]) 11/21/2013   Neurogenic bladder 11/21/2013    PAST SURGICAL HISTORY: Past Surgical History:  Procedure Laterality Date   ABLATION COLPOCLESIS  2004   CESAREAN SECTION     TUBAL LIGATION      FAMILY HISTORY: Family History  Problem Relation Age of Onset   Heart failure Father    Multiple sclerosis Sister    Cancer Neg Hx    Diabetes Neg Hx    Hyperlipidemia Neg Hx    Hypertension Neg Hx    Kidney disease Neg Hx    Stroke Neg Hx     SOCIAL HISTORY: Social History   Socioeconomic History   Marital status: Divorced    Spouse name: Not on file   Number of children: 2   Years of education: college   Highest education level: Not on file  Occupational History   Occupation: disability  Social Designer, fashion/clothing strain: Not on file   Food insecurity    Worry: Not on file    Inability: Not on file   Transportation needs    Medical: Not on file    Non-medical: Not on file  Tobacco Use   Smoking  status: Never Smoker   Smokeless tobacco: Never Used  Substance and Sexual Activity   Alcohol use: Yes    Frequency: Never    Comment: social   Drug use: No   Sexual activity: Yes    Birth control/protection: Surgical  Lifestyle   Physical activity    Days per week: Not on file    Minutes per session: Not on file   Stress: Not on file  Relationships   Social connections    Talks on phone: Not on file    Gets together: Not on file    Attends religious service: Not on file    Active member of club  or organization: Not on file    Attends meetings of clubs or organizations: Not on file    Relationship status: Not on file   Intimate partner violence    Fear of current or ex partner: Not on file    Emotionally abused: Not on file    Physically abused: Not on file    Forced sexual activity: Not on file  Other Topics Concern   Not on file  Social History Narrative   Patient is right handed.   Patient drinks 2 cups of coffee daily.      PHYSICAL EXAM  Vitals:   06/16/19 1112  BP: 118/85  Pulse: 72  Temp: (!) 97.5 F (36.4 C)  TempSrc: Oral  Weight: 190 lb 6.4 oz (86.4 kg)  Height: 5' 4.5" (1.638 m)   Body mass index is 32.18 kg/m.  Generalized: Well developed, in no acute distress  Cardiology: normal rate and rhythm, no murmur noted Neurological examination  Mentation: Alert oriented to time, place, history taking. Follows all commands speech and language fluent Cranial nerve II-XII: Pupils were equal round reactive to light. Extraocular movements were full, visual field were full on confrontational test. Facial sensation and strength were normal. Uvula tongue midline. Head turning and shoulder shrug  were normal and symmetric. Motor: The motor testing reveals 5 over 5 strength of all 4 extremities. Good symmetric motor tone is noted throughout.  Sensory: Sensory testing is intact to soft touch on all 4 extremities. No evidence of extinction is noted.    Coordination: Cerebellar testing reveals good finger-nose-finger and heel-to-shin bilaterally.  Gait and station: Gait is normal. Tandem gait is normal. Romberg is negative. No drift is seen.  Reflexes: Deep tendon reflexes are symmetric and normal bilaterally.   DIAGNOSTIC DATA (LABS, IMAGING, TESTING) - I reviewed patient records, labs, notes, testing and imaging myself where available.  No flowsheet data found.   Lab Results  Component Value Date   WBC 5.6 05/22/2019   HGB 11.6 05/22/2019   HCT 35.8 05/22/2019   MCV 95 05/22/2019   PLT 172 05/22/2019      Component Value Date/Time   NA 144 05/22/2019 1631   NA 142 04/05/2015 1435   K 4.0 05/22/2019 1631   K 3.9 04/05/2015 1435   CL 109 (H) 05/22/2019 1631   CO2 23 05/22/2019 1631   CO2 24 04/05/2015 1435   GLUCOSE 70 05/22/2019 1631   GLUCOSE 93 03/23/2016 1402   GLUCOSE 89 04/05/2015 1435   BUN 15 05/22/2019 1631   BUN 18.2 04/05/2015 1435   CREATININE 1.07 (H) 05/22/2019 1631   CREATININE 1.1 04/05/2015 1435   CALCIUM 9.0 05/22/2019 1631   CALCIUM 9.3 04/05/2015 1435   PROT 6.3 05/22/2019 1631   PROT 6.9 04/05/2015 1435   ALBUMIN 4.4 05/22/2019 1631   ALBUMIN 4.2 04/05/2015 1435   AST 21 05/22/2019 1631   AST 27 04/05/2015 1435   ALT 24 05/22/2019 1631   ALT 46 04/05/2015 1435   ALKPHOS 81 05/22/2019 1631   ALKPHOS 53 04/05/2015 1435   BILITOT 0.2 05/22/2019 1631   BILITOT 0.22 04/05/2015 1435   GFRNONAA 61 05/22/2019 1631   GFRAA 70 05/22/2019 1631   Lab Results  Component Value Date   CHOL 191 08/18/2011   HDL 66.10 08/18/2011   LDLCALC 103 (H) 08/18/2011   TRIG 112.0 08/18/2011   CHOLHDL 3 08/18/2011   No results found for: HGBA1C Lab Results  Component Value Date   VITAMINB12 470 04/05/2015  Lab Results  Component Value Date   TSH 1.80 08/18/2011       ASSESSMENT AND PLAN 48 y.o. year old female  has a past medical history of Abnormality of gait (11/25/2014), Chronic daily headache  (2003), Depression, Memory disorder (02/29/2016), MS (multiple sclerosis) (HCC) (1992), Multiple sclerosis (HCC) (11/21/2013), and Neurogenic bladder (11/21/2013). here with     ICD-10-CM   1. Multiple sclerosis (HCC)  G35   2. Neurogenic bladder  N31.9   3. Chronic tension-type headache, intractable  G44.221   4. 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 Constipation, unspecified constipation type  K59.00     Elease Hashimotoatricia is doing well overall and MS is stable. We will continue to monitor heaviness in legs but exam today is intact neurologically. No weakness or sensation changes. Fall precautions discussed. She will continue daily exercise. She will continue Tysabri infusions monthly. Continue oxybutynin for neurogenic bladder and gabapentin for numbness. She was advised to take Amovig, Pamelor and Depakote consistently for headaches. Dilantin for hemifacial spasms. Amitiza for constipation. Adequate hydration and well balanced diet advised. She wll follow up with Dr Anne HahnWillis in 6 months, sooner if needed.    No orders of the defined types were placed in this encounter.    No orders of the defined types were placed in this encounter.     I spent 40 minutes with the patient. 50% of this time was spent counseling and educating patient on plan of care and medications.    Shawnie Dappermy Angala Hilgers, FNP-C 06/16/2019, 12:01 PM [facility] [address], [address] [place], [place] [id number] [phone]

## 2019-06-15 NOTE — Patient Instructions (Addendum)
Continue Tysabri infusions, gabapentin and oxybutynin as prescribed   Make sure to take Pamelor three capsules with Amovig and Depakote for headaches  Follow up with Dr Jannifer Franklin in 6 months  Multiple Sclerosis Multiple sclerosis (MS) is a disease of the brain, spinal cord, and optic nerves (central nervous system). It causes the body's disease-fighting (immune) system to destroy the protective covering (myelin sheath) around nerves in the brain. When this happens, signals (nerve impulses) going to and from the brain and spinal cord do not get sent properly or may not get sent at all. There are several types of MS:  Relapsing-remitting MS. This is the most common type. This causes sudden attacks of symptoms. After an attack, you may recover completely until the next attack, or some symptoms may remain permanently.  Secondary progressive MS. This usually develops after the onset of relapsing-remitting MS. Similar to relapsing-remitting MS, this type also causes sudden attacks of symptoms. Attacks may be less frequent, but symptoms slowly get worse (progress) over time.  Primary progressive MS. This causes symptoms that steadily progress over time. This type of MS does not cause sudden attacks of symptoms. The age of onset of MS varies, but it often develops between 86-61 years of age. MS is a lifelong (chronic) condition. There is no cure, but treatment can help slow down the progression of the disease. What are the causes? The cause of this condition is not known. What increases the risk? You are more likely to develop this condition if:  You are a woman.  You have a relative with MS. However, the condition is not passed from parent to child (inherited).  You have a lack (deficiency) of vitamin D.  You smoke. MS is more common in the [place] than in the [place]. What are the signs or symptoms? Relapsing-remitting and secondary progressive MS cause symptoms  to occur in episodes or attacks that may last weeks to months. There may be long periods between attacks in which there are almost no symptoms. Primary progressive MS causes symptoms to steadily progress after they develop. Symptoms of MS vary because of the many different ways it affects the central nervous system. The main symptoms include:  Vision problems and eye pain.  Numbness.  Weakness.  Inability to move your arms, hands, feet, or legs (paralysis).  Balance problems.  Shaking that you cannot control (tremors).  Muscle spasms.  Problems with thinking (cognitive changes). MS can also cause symptoms that are associated with the disease, but are not always the direct result of an MS attack. They may include:  Inability to control urination or bowel movements (incontinence).  Headaches.  Fatigue.  Inability to tolerate heat.  Emotional changes.  Depression.  Pain. How is this diagnosed? This condition is diagnosed based on:  Your symptoms.  A neurological exam. This involves checking central nervous system function, such as nerve function, reflexes, and coordination.  MRIs of the brain and spinal cord.  Lab tests, including a lumbar puncture that tests the fluid that surrounds the brain and spinal cord (cerebrospinal fluid).  Tests to measure the electrical activity of the brain in response to stimulation (evoked potentials). How is this treated? There is no cure for MS, but medicines can help decrease the number and frequency of attacks and help relieve nuisance symptoms. Treatment options may include:  Medicines that reduce the frequency of attacks. These medicines may be given by injection, by mouth (orally), or through an IV.  Medicines that  reduce inflammation (steroids). These may provide short-term relief of symptoms.  Medicines to help control pain, depression, fatigue, or incontinence.  Vitamin D, if you have a deficiency.  Using devices to help  you move around (assistive devices), such as braces, a cane, or a walker.  Physical therapy to strengthen and stretch your muscles.  Occupational therapy to help you with everyday tasks.  Alternative or complementary treatments such as exercise, massage, or acupuncture. Follow these instructions at home:  Take over-the-counter and prescription medicines only as told by your health care provider.  Do not drive or use heavy machinery while taking prescription pain medicine.  Use assistive devices as recommended by your physical therapist or your health care provider.  Exercise as directed by your health care provider.  Return to your normal activities as told by your health care provider. Ask your health care provider what activities are safe for you.  Reach out for support. Share your feelings with friends, family, or a support group.  Keep all follow-up visits as told by your health care provider and therapists. This is important. Where to find more information  National Multiple Sclerosis Society: https://www.nationalmssociety.org Contact a health care provider if:  You feel depressed.  You develop new pain or numbness.  You have tremors.  You have problems with sexual function. Get help right away if:  You develop paralysis.  You develop numbness.  You have problems with your bladder or bowel function.  You develop double vision.  You lose vision in one or both eyes.  You develop suicidal thoughts.  You develop severe confusion. If you ever feel like you may hurt yourself or others, or have thoughts about taking your own life, get help right away. You can go to your nearest emergency department or call:  Your local emergency services (911 in the [place]  A suicide crisis helpline, such as the National Suicide Prevention Lifeline at 364-687-6016. This is open 24 hours a day. Summary  Multiple sclerosis (MS) is a disease of the central nervous system that  causes the body's immune system to destroy the protective covering (myelin sheath) around nerves in the brain.  There are 3 types of MS: relapsing-remitting, secondary progressive, and primary progressive. Relapsing-remitting and secondary progressive MS cause symptoms to occur in episodes or attacks that may last weeks to months. Primary progressive MS causes symptoms to steadily progress after they develop.  There is no cure for MS, but medicines can help decrease the number and frequency of attacks and help relieve nuisance symptoms. Treatment may also include physical or occupational therapy.  If you develop numbness, paralysis, vision problems, or other neurological symptoms, get help right away. This information is not intended to replace advice given to you by your health care provider. Make sure you discuss any questions you have with your health care provider. Document Released: 07/21/2000 Document Revised: 07/06/2017 Document Reviewed: 10/02/2016 Elsevier Patient Education  2020 [employer].

## 2019-06-16 ENCOUNTER — Encounter: Payer: Self-pay | Admitting: Family Medicine

## 2019-06-16 ENCOUNTER — Other Ambulatory Visit: Payer: Self-pay

## 2019-06-16 ENCOUNTER — Ambulatory Visit (INDEPENDENT_AMBULATORY_CARE_PROVIDER_SITE_OTHER): Payer: Medicare Other | Admitting: Family Medicine

## 2019-06-16 VITALS — BP 118/85 | HR 72 | Temp 97.5°F | Ht 64.5 in | Wt 190.4 lb

## 2019-06-16 DIAGNOSIS — N319 Neuromuscular dysfunction of bladder, unspecified: Secondary | ICD-10-CM

## 2019-06-16 DIAGNOSIS — K5900 Constipation, unspecified: Secondary | ICD-10-CM

## 2019-06-16 DIAGNOSIS — G35 Multiple sclerosis: Secondary | ICD-10-CM

## 2019-06-16 DIAGNOSIS — G44221 Chronic tension-type headache, intractable: Secondary | ICD-10-CM

## 2019-06-16 DIAGNOSIS — G514 Facial myokymia: Secondary | ICD-10-CM | POA: Diagnosis not present

## 2019-06-16 NOTE — Progress Notes (Signed)
I have read the note, and I agree with the clinical assessment and plan.  Nazifa Trinka K Muranda Coye   

## 2019-07-16 ENCOUNTER — Telehealth: Payer: Self-pay

## 2019-07-16 NOTE — Telephone Encounter (Signed)
Med approved until 08/07/19

## 2019-07-16 NOTE — Telephone Encounter (Signed)
Started a PA renewal for Pts  AIMOVIG 140/MG/ML on CMM.  Key: [id number]   Awaiting results

## 2019-07-21 ENCOUNTER — Other Ambulatory Visit: Payer: Self-pay | Admitting: Neurology

## 2019-07-31 ENCOUNTER — Other Ambulatory Visit: Payer: Self-pay | Admitting: Neurology

## 2019-08-04 ENCOUNTER — Other Ambulatory Visit: Payer: Self-pay

## 2019-08-04 MED ORDER — PHENYTOIN SODIUM EXTENDED 100 MG PO CAPS: ORAL_CAPSULE | ORAL | 1 refills | Status: DC

## 2019-09-11 ENCOUNTER — Other Ambulatory Visit: Payer: Self-pay

## 2019-09-11 ENCOUNTER — Telehealth: Payer: Self-pay | Admitting: Neurology

## 2019-09-11 ENCOUNTER — Other Ambulatory Visit: Payer: Medicare Other

## 2019-09-11 MED ORDER — AIMOVIG 140 MG/ML ~~LOC~~ SOAJ: 140.0000 mg | SUBCUTANEOUS | 6 refills | Status: DC

## 2019-09-11 NOTE — Telephone Encounter (Signed)
I called the patient, left message.  The recent CBC that was done shows an MCV of 95, no evidence of anemia, unlikely that the patient has an iron deficiency issue.  She should be getting her annual blood work through her primary care physician to include a thyroid profile.  If not, we can add this to the next blood work that we do.

## 2019-09-11 NOTE — Telephone Encounter (Signed)
Patient requesting Dr. Anne Hahn put a lab order for iron as she is always cold. Best call back is [phone]

## 2019-09-29 ENCOUNTER — Telehealth: Payer: Self-pay | Admitting: Neurology

## 2019-09-29 NOTE — Telephone Encounter (Signed)
Patient called stating that she is needing a letter about her current health issues and MS.   Patient would also like a CB in regards to why she may be feeling cold and her blood count is "ok" and she still has the cold feeling

## 2019-09-29 NOTE — Telephone Encounter (Signed)
I called pt about needing a letter for her insurance company. Pt stated one was written last year and we can just print it off. I stated Dr Anne Hahn will not be in the office till Monday and it will need to be sign. I advise pt if she needed it sooner ask insurance company if they want current office notes for her MS issues.I advise pt if they can accept office notes to contact medical records and they can assist her if that's acceptable.Pt stated she is still cold and does not know why and knows her labs were good. She stated Dr. Anne Hahn gave her a call.i advise her to seek her PCP for her cold feeling to be evaluate. See phone note from 09/11/2019 that Dr. Anne Hahn did recommend her seeing PCP for more lab work. Pt verbalized understanding.

## 2019-10-01 ENCOUNTER — Telehealth: Payer: Self-pay | Admitting: Family Medicine

## 2019-10-01 NOTE — Telephone Encounter (Signed)
Pt is asking for a call from RN to discuss her having really bad migraines again.  Pt is also asking if Dr Anne Hahn could make a referral for her to a psychiatrist (pt stated she is wanting to talk to someone about so much going on in her life in addition to coping with her MS) please call

## 2019-10-02 NOTE — Addendum Note (Signed)
Addended by: Guy Begin on: 10/02/2019 09:32 AM   Modules accepted: Orders

## 2019-10-02 NOTE — Telephone Encounter (Addendum)
I called pt and she related that she has been having constant headaches. Has fatigue and memory fog. I went over her medications, she has a lot of bottles, not sure what things are for, I instructed as I could.  She is not taking nortriptyline.  She is taking divalproex and aimovig.  She is also taking topiramte 100mg  po qhs, added to her medlist. Advise please on taking nortriptyline as well as topiramate.  I relayed to pt to bring medication bottles /list of meds with her to next appt (here or pcp) to go over, what she should be on. I called pharmacy and they are to fax over pts list of medications (prescribed).

## 2019-10-02 NOTE — Telephone Encounter (Signed)
I recommend she take nortriptyline as prescribed (three tablets daily, but start 1 tablet for a week, then increase to two tablets for 1 week, then three tablets nightly). She should also be taking Depakote 500mg  BID, Amovig 140mg  monthly, gabapentin 300mg  BID, and Dilantin 200mg  at bedtime. Not sure who started topiramate, may want to confirm but I am ok with continuing if helpful. This medication is bad about causing brain fog.   Oxybutynin should be 10mg  in am and 20mg  at bedtime.   Tysabri infusions monthly.

## 2019-10-09 ENCOUNTER — Telehealth: Payer: Self-pay | Admitting: *Deleted

## 2019-10-09 NOTE — Telephone Encounter (Signed)
I called pt and relayed that she is able to make appt herself to see couselor psychlogist.  Check with insurance first and see who is in network for her insurance. She appreciated call.

## 2019-10-09 NOTE — Telephone Encounter (Signed)
Patient is interested in making an appointment with a counselor or psychiatrist.  Please call to discuss.

## 2019-10-09 NOTE — Telephone Encounter (Signed)
Fax confirmation received 10-09-19 MS touch pt status report and questionaire.  [phone].

## 2019-10-16 ENCOUNTER — Telehealth: Payer: Self-pay | Admitting: Family Medicine

## 2019-10-16 DIAGNOSIS — G44221 Chronic tension-type headache, intractable: Secondary | ICD-10-CM

## 2019-10-16 NOTE — Telephone Encounter (Signed)
I am not sure who she would see? Neurology? Is she wanting second opinion? Is there any certain provider? I am ok with referral if she wishes.

## 2019-10-16 NOTE — Telephone Encounter (Signed)
Patient called stating she is interested in getting a referral to be seen at [facility]  for migraines as she is still struggling with them

## 2019-10-20 NOTE — Telephone Encounter (Signed)
I called pt and LMVM for her that can do this for her, who does she want to got to, is it for second opinion, Neurology??  Please let  us know.

## 2019-10-20 NOTE — Telephone Encounter (Signed)
I called pt and relayed that we are willing to do referral for her.  She said if local facility she was ok with this.   [facility].

## 2019-10-23 ENCOUNTER — Telehealth: Payer: Self-pay

## 2019-10-23 NOTE — Telephone Encounter (Signed)
I called spoke to gabriel, who relayed to [facility], that the person who signed was AmY Nicholas Lose, NP.  They will refaxed and needed printed name on this as well.

## 2019-10-23 NOTE — Telephone Encounter (Signed)
MS touch form for tysabri fax to [phone], fax and confirmed.

## 2019-10-23 NOTE — Telephone Encounter (Signed)
[employer] has called re: the form for pt's Tysabri, she states the form is signed but who ever signed the form also needs to print their name, please call [phone]

## 2019-10-27 NOTE — Telephone Encounter (Signed)
MS Touch authorization form received valid from 10-22-19 through 04-21-20. [phone].

## 2019-11-05 ENCOUNTER — Other Ambulatory Visit: Payer: Self-pay

## 2019-11-05 DIAGNOSIS — G35 Multiple sclerosis: Secondary | ICD-10-CM

## 2019-11-06 ENCOUNTER — Telehealth: Payer: Self-pay

## 2019-11-06 NOTE — Addendum Note (Signed)
Addended by: Tamera Stands D on: 11/06/2019 02:57 PM   Modules accepted: Orders

## 2019-11-06 NOTE — Telephone Encounter (Signed)
JCV virus lab put in [facility] box for pick up with form completed.

## 2019-11-17 ENCOUNTER — Telehealth: Payer: Self-pay | Admitting: Neurology

## 2019-11-17 ENCOUNTER — Other Ambulatory Visit: Payer: Self-pay

## 2019-11-17 MED ORDER — DIVALPROEX SODIUM 500 MG PO DR TAB: DELAYED_RELEASE_TABLET | ORAL | 1 refills | Status: DC

## 2019-11-17 NOTE — Telephone Encounter (Signed)
The JC viral antibody panel was negative, index value of 0.18.

## 2019-11-18 ENCOUNTER — Other Ambulatory Visit: Payer: Self-pay | Admitting: Neurology

## 2019-11-26 ENCOUNTER — Telehealth: Payer: Self-pay | Admitting: Family Medicine

## 2019-11-26 NOTE — Telephone Encounter (Signed)
Rep with [facility] called to verify if home health order referral was received

## 2019-11-26 NOTE — Telephone Encounter (Signed)
I called Kadija back to clarify message. She stated the pt wants home care services through them and they have faxed the order to our office for Dr. Anne Hahn to address/sign. I spoke with Katrina, Dr. Clarisa Kindred nurse. She has the order to address with Dr. Anne Hahn when he returns to the office next Monday. Kadija aware and verbalized appreciation.

## 2019-12-01 NOTE — Telephone Encounter (Signed)
I called Kadija back at [phone]. She stated the pt request needing a personal care aide or CNA. She stated the MD only does questions 3-5. I stated Amy NP or Dr. Anne Hahn will review form.

## 2019-12-02 NOTE — Telephone Encounter (Signed)
DMA form for request for independent assessment for personal care services attestation of medical need filled out and signed by Dr. Anne Hahn. Form fax twice to [phone] twice and confirmed.

## 2019-12-18 ENCOUNTER — Other Ambulatory Visit: Payer: Self-pay

## 2019-12-18 ENCOUNTER — Ambulatory Visit (INDEPENDENT_AMBULATORY_CARE_PROVIDER_SITE_OTHER): Payer: Medicare Other | Admitting: Neurology

## 2019-12-18 ENCOUNTER — Encounter: Payer: Self-pay | Admitting: Neurology

## 2019-12-18 VITALS — BP 139/88 | HR 61 | Temp 97.2°F | Ht 64.5 in | Wt 195.0 lb

## 2019-12-18 DIAGNOSIS — G35 Multiple sclerosis: Secondary | ICD-10-CM

## 2019-12-18 DIAGNOSIS — R269 Unspecified abnormalities of gait and mobility: Secondary | ICD-10-CM

## 2019-12-18 DIAGNOSIS — R413 Other amnesia: Secondary | ICD-10-CM

## 2019-12-18 DIAGNOSIS — G514 Facial myokymia: Secondary | ICD-10-CM

## 2019-12-18 DIAGNOSIS — Z5181 Encounter for therapeutic drug level monitoring: Secondary | ICD-10-CM

## 2019-12-18 DIAGNOSIS — N319 Neuromuscular dysfunction of bladder, unspecified: Secondary | ICD-10-CM | POA: Diagnosis not present

## 2019-12-18 MED ORDER — ZONISAMIDE 100 MG PO CAPS: 100.0000 mg | ORAL_CAPSULE | Freq: Every day | ORAL | Status: DC

## 2019-12-18 NOTE — Progress Notes (Signed)
JCV lab specimen placed in [facility] lock box for pick up, 12:49 pm.

## 2019-12-18 NOTE — Progress Notes (Signed)
Reason for visit: Multiple sclerosis, chronic daily headache, gait disorder, neurogenic bladder  Connie Robertson is an 49 y.o. female  History of present illness:  Connie Robertson is a 49 year old right-handed black female with a history of multiple sclerosis.  She has some weakness of the left hand and left leg.  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  The patient remains on Tysabri, she tolerates the medication well, and she has had relatively stability on the medication.  She is currently followed through Dr. Domingo Cocking for her headaches, she is getting trigger point injections without much benefit currently.  She is no longer on Aimovig, she claims that she had 1 Botox injection without benefit and did not continue this because of the discomfort associated with it.  The patient is now going on Zonegran for her headache and remains on Topamax, she is on Depakote and nortriptyline as well.  She takes phenytoin for her hemifacial spasm, this has been well controlled.  She returns for further evaluation.  She continues to have problems with urinary frequency and urgency.  She is on oxybutynin for the bladder.  Past Medical History:  Diagnosis Date  . Abnormality of gait 11/25/2014  . Chronic daily headache 2003   [place]  . Depression   . Memory disorder 02/29/2016  . MS (multiple sclerosis) (HCC) 1992   Dr. Erling Cruz  . Multiple sclerosis ([place]) 11/21/2013  . Neurogenic bladder 11/21/2013    Past Surgical History:  Procedure Laterality Date  . ABLATION COLPOCLESIS  2004  . CESAREAN SECTION    . TUBAL LIGATION      Family History  Problem Relation Age of Onset  . Heart failure Father   . Multiple sclerosis Sister   . Cancer Neg Hx   . Diabetes Neg Hx   . Hyperlipidemia Neg Hx   . Hypertension Neg Hx   . Kidney disease Neg Hx   . Stroke Neg Hx     Social history:  reports that she has never smoked. She has never used smokeless tobacco. She reports  current alcohol use. She reports that she does not use drugs.   No Known Allergies  Medications:  Prior to Admission medications   Medication Sig Start Date End Date Taking? Authorizing Provider  baclofen (LIORESAL) 10 MG tablet Take 1 tablet (10 mg total) by mouth at bedtime. 09/24/17  Yes Brunetta Jeans, PA-C  divalproex (DEPAKOTE) 500 MG DR tablet 1 TABLET BY MOUTH TWICE DAILY 11/17/19  Yes Kathrynn Ducking, MD  gabapentin (NEURONTIN) 300 MG capsule TAKE 1 CAPSULE(300 MG) BY MOUTH TWICE DAILY 10/23/18  Yes Kathrynn Ducking, MD  lubiprostone (AMITIZA) 24 MCG capsule Amitiza 24 mcg capsule  TK 1 C PO BID   Yes [provider]  meloxicam (MOBIC) 7.5 MG tablet Take 1 tablet (7.5 mg total) by mouth daily. 09/24/17  Yes Brunetta Jeans, PA-C  nortriptyline (PAMELOR) 10 MG capsule take 3 capsules at night 08/21/18  Yes Kathrynn Ducking, MD  oxybutynin (DITROPAN-XL) 10 MG 24 hr tablet TAKE 1 TABLET BY MOUTH EVERY MORNING AND 2 TABLETS EVERY EVENING 05/05/19  Yes Kathrynn Ducking, MD  phentermine 37.5 MG capsule Take 37.5 mg by mouth every morning.   Yes [provider]  phenytoin (DILANTIN) 100 MG ER capsule TAKE 2 CAPSULES(200 MG) BY MOUTH AT BEDTIME 08/04/19  Yes Kathrynn Ducking, MD  topiramate (TOPAMAX) 100 MG tablet Take 100 mg by mouth  daily.   Yes [provider]  TYSABRI 300 MG/15ML injection INFUSE 300MG  IV PER  PRESCRIBING INFORMATION  EVERY 4 WEEKS AS DIRECTED  (GIVEN AT MD OFFICE,  DISCARD UNUSED) 11/18/19  Yes 11/20/19, MD  valACYclovir (VALTREX) 500 MG tablet Take 1 tablet (500 mg total) by mouth daily. 08/20/17  Yes 08/22/17, PA-C  Vitamin D, Ergocalciferol, (DRISDOL) 1.25 MG (50000 UNIT) CAPS capsule Take 50,000 Units by mouth every 7 (seven) days.   Yes [provider]  zonisamide (ZONEGRAN) 100 MG capsule Take 1 capsule (100 mg total) by mouth daily. 12/18/19   12/20/19, MD    ROS:  Out of a complete 14 system  review of symptoms, the patient complains only of the following symptoms, and all other reviewed systems are negative.  Urination difficulty Headache Walking problems  Blood pressure 139/88, pulse 61, temperature (!) 97.2 F (36.2 C), height 5' 4.5" (1.638 m), weight 195 lb (88.5 kg).  Physical Exam  General: The patient is alert and cooperative at the time of the examination.  The patient is mildly obese.  Skin: No significant peripheral edema is noted.   Neurologic Exam  Mental status: The patient is alert and oriented x 3 at the time of the examination. The patient has apparent normal recent and remote memory, with an apparently normal attention span and concentration ability.   Cranial nerves: Facial symmetry is present. Speech is normal, no aphasia or dysarthria is noted. Extraocular movements are full. Visual fields are full.  Pupils are equal, round, and reactive to light.  Discs are flat bilaterally.  Motor: The patient has good strength in all 4 extremities.  Sensory examination: Soft touch sensation is symmetric on the face, arms, and legs.  Coordination: The patient has good finger-nose-finger and heel-to-shin bilaterally.  Gait and station: The patient has a slightly wide-based gait, tandem gait is unsteady.  Romberg is negative but is slightly unsteady.  Reflexes: Deep tendon reflexes are symmetric.   Assessment/Plan:  1.  Multiple sclerosis, relapsing remitting  2.  Gait disturbance  3.  Neurogenic bladder  4.  Chronic headache  The patient will continue Tysabri for now, we will check blood work today.  She will follow-up here in 6 months.  [facility].   York Spaniel MD 12/18/2019 11:54 AM  [facility] [address] [place], [place]  Phone [phone] Fax [phone]

## 2019-12-19 LAB — CBC WITH DIFFERENTIAL/PLATELET
Basophils Absolute: 0 10*3/uL (ref 0.0–0.2)
Basos: 0 %
EOS (ABSOLUTE): 0.1 10*3/uL (ref 0.0–0.4)
Eos: 3 %
Hematocrit: 35.5 % (ref 34.0–46.6)
Hemoglobin: 12.2 g/dL (ref 11.1–15.9)
Immature Grans (Abs): 0 10*3/uL (ref 0.0–0.1)
Immature Granulocytes: 0 %
Lymphocytes Absolute: 2.4 10*3/uL (ref 0.7–3.1)
Lymphs: 52 %
MCH: 31 pg (ref 26.6–33.0)
MCHC: 34.4 g/dL (ref 31.5–35.7)
MCV: 90 fL (ref 79–97)
Monocytes Absolute: 0.3 10*3/uL (ref 0.1–0.9)
Monocytes: 8 %
Neutrophils Absolute: 1.7 10*3/uL (ref 1.4–7.0)
Neutrophils: 37 %
Platelets: 170 10*3/uL (ref 150–450)
RBC: 3.93 x10E6/uL (ref 3.77–5.28)
RDW: 13.7 % (ref 11.7–15.4)
WBC: 4.6 10*3/uL (ref 3.4–10.8)

## 2019-12-19 LAB — COMPREHENSIVE METABOLIC PANEL
ALT: 18 IU/L (ref 0–32)
AST: 18 IU/L (ref 0–40)
Albumin/Globulin Ratio: 2.4 — ABNORMAL HIGH (ref 1.2–2.2)
Albumin: 4.5 g/dL (ref 3.8–4.8)
Alkaline Phosphatase: 82 IU/L (ref 39–117)
BUN/Creatinine Ratio: 17 (ref 9–23)
BUN: 16 mg/dL (ref 6–24)
Bilirubin Total: <0.2 mg/dL (ref 0.0–1.2)
CO2: 27 mmol/L (ref 20–29)
Calcium: 9.4 mg/dL (ref 8.7–10.2)
Chloride: 109 mmol/L — ABNORMAL HIGH (ref 96–106)
Creatinine, Ser: 0.95 mg/dL (ref 0.57–1.00)
GFR calc Af Amer: 81 mL/min/{1.73_m2} (ref >59)
GFR calc non Af Amer: 70 mL/min/{1.73_m2} (ref >59)
Globulin, Total: 1.9 g/dL (ref 1.5–4.5)
Glucose: 78 mg/dL (ref 65–99)
Potassium: 4.4 mmol/L (ref 3.5–5.2)
Sodium: 146 mmol/L — ABNORMAL HIGH (ref 134–144)
Total Protein: 6.4 g/dL (ref 6.0–8.5)

## 2019-12-19 LAB — PHENYTOIN LEVEL, TOTAL: Phenytoin (Dilantin), Serum: 3.6 ug/mL — ABNORMAL LOW (ref 10.0–20.0)

## 2019-12-29 ENCOUNTER — Telehealth: Payer: Self-pay | Admitting: Neurology

## 2019-12-29 NOTE — Telephone Encounter (Signed)
JC viral antibody was negative, index value of 0.14.

## 2020-01-13 ENCOUNTER — Telehealth: Payer: Self-pay | Admitting: Neurology

## 2020-01-13 NOTE — Telephone Encounter (Signed)
I called the patient, left a message.  The Connie Robertson does not come in a pill form.  She was on Gilenya previously but this probably represents a more effective and well-tolerated treatment for multiple sclerosis.  Her JC viral antibody panels have been negative, I would not recommend switching medical therapy for now.  She has been on Tysabri for a number of years.

## 2020-01-13 NOTE — Telephone Encounter (Signed)
Pt is asking for a call to discuss going back to pill form of TYSABRI 300 MG/15ML injection

## 2020-01-13 NOTE — Telephone Encounter (Signed)
Pt called once more regarding the Tysabri. I let her know the message was sent to Dr. Anne Hahn and that it can take up to 48 hours to return calls but he will contact her. Pt verbalized understanding and appreciation.

## 2020-01-19 NOTE — Telephone Encounter (Signed)
I called pt about wanting a stronger medication for her bladder. I stated per the directions for oxybutyrin 10mg  its written for her to take one pill in the am and two in evening. Pt stated she has been having accidents and was taking 1 pill in the am and taking 2-4 pills in the evening.i advise pt to always call the MD before she increase dosage of any medication our office prescribed. Pt states she only has a couple pills left and wanted to know is there  another medication that's stronger for bladder controlled. Pt stated the 2-4 pills she took in the evening did help sometimes. I advise pt that message will be sent to work in MD. She verbalized understanding.

## 2020-01-19 NOTE — Telephone Encounter (Addendum)
Patient is returning Dr. Anne Hahn Phone  see message below .   Patient is also having accidents and wants to know if her she can get a stronger dose of  oxybutynin (DITROPAN-XL) 10 MG 24 hr tablet .

## 2020-01-19 NOTE — Telephone Encounter (Signed)
I call pt that Dr.Willis left her a detail message and the tysabri only comes in IV infusion not pill formed.I stated he would like for her to stay on tysabri infusion and he JCV has been negative. He does not recommend she switch MS therapies now.Pt verbalized understanding.PT stated she only ask because if she gets a job she may not qualify. I stated the medication has resources and foundation that pt apply for when they are working. Pt verbalized understanding.

## 2020-01-19 NOTE — Telephone Encounter (Signed)
Lets add Myrbetriq 50 mg daily.

## 2020-01-20 MED ORDER — MIRABEGRON ER 50 MG PO TB24: 50.0000 mg | ORAL_TABLET | Freq: Every day | ORAL | 0 refills | Status: DC

## 2020-01-20 NOTE — Telephone Encounter (Signed)
I called pt that Dr.Sater stated she can try myrbetriq 50mg  for her bladder issues to see if it works. PT confirmed pharmacy in the system and verbalized understanding.

## 2020-01-20 NOTE — Addendum Note (Signed)
Addended by: Hildred Alamin on: 01/20/2020 01:26 PM   Modules accepted: Orders

## 2020-01-20 NOTE — Telephone Encounter (Signed)
Order sent for myrbetriq 50 mg daily per Dr.Sater to pts pharmacy.PT took her last pill of oxybutyrin  today.

## 2020-01-31 ENCOUNTER — Other Ambulatory Visit: Payer: Self-pay | Admitting: Neurology

## 2020-02-02 ENCOUNTER — Telehealth: Payer: Self-pay | Admitting: Neurology

## 2020-02-02 NOTE — Telephone Encounter (Signed)
I called the pt back and LVM asking for call back. Unable to leave details. When the pt calls back, please give her Dr. Trevor Mace message below and confirm if she meant to call the [facility] center. If so, their number is [phone].

## 2020-02-02 NOTE — Telephone Encounter (Signed)
Patient is wanting a referral for her migraines to [facility] patient is sleeping her life a way .

## 2020-02-02 NOTE — Telephone Encounter (Signed)
Patient is followed through the [facility] for her migraines not with [place], Dr. Anne Hahn has nothing in his assessment and plan about a referral to [facility] she may have meant to call the [facility]?

## 2020-02-03 NOTE — Telephone Encounter (Signed)
I called the pt. She said she had discussed [facility] referral with headache wellness center but she was told she already tried everything. I advised the pt since she is being followed by them either headache wellness center or PCP would need to refer to [facility]. She verbalized understanding and appreciation and stated she would contact her primary care.

## 2020-02-17 ENCOUNTER — Other Ambulatory Visit: Payer: Self-pay | Admitting: Neurology

## 2020-03-18 ENCOUNTER — Telehealth: Payer: Self-pay | Admitting: Neurology

## 2020-03-18 NOTE — Telephone Encounter (Signed)
Pt has called asking for a call from RN to discuss sleep medication which started as 100mg  and is now 200mg (pt does not recall name of medication)

## 2020-03-18 NOTE — Telephone Encounter (Signed)
Called the patient back and she has misplaced her dilantin medication. Advised the patient that unfortunately according to the records she would have a month before it would be due for a refill. Advised that insurance would not allow it to be filled sooner. Pt verbalized understanding and was appreciative.

## 2020-03-20 ENCOUNTER — Other Ambulatory Visit: Payer: Self-pay | Admitting: Neurology

## 2020-03-22 ENCOUNTER — Telehealth: Payer: Self-pay | Admitting: Neurology

## 2020-03-22 NOTE — Telephone Encounter (Signed)
Pt request refill MYRBETRIQ 50 MG TB24 tablet at [facility] #[id number].  Pt would like nurse to call her to ask If she can take medication twice a day; morning and evening?

## 2020-03-22 NOTE — Telephone Encounter (Signed)
I tried to call the patient, the current dose of Myrbetriq is a maximal dose, you can increase higher than 50 mg daily.

## 2020-03-22 NOTE — Telephone Encounter (Signed)
The patient returned the call and was informed of the information below.

## 2020-03-24 ENCOUNTER — Other Ambulatory Visit: Payer: Self-pay | Admitting: Neurology

## 2020-03-25 ENCOUNTER — Other Ambulatory Visit: Payer: Self-pay | Admitting: *Deleted

## 2020-03-25 MED ORDER — TYSABRI 300 MG/15ML IV CONC: INTRAVENOUS | 3 refills | Status: DC

## 2020-04-20 ENCOUNTER — Other Ambulatory Visit: Payer: Self-pay | Admitting: Neurology

## 2020-05-09 ENCOUNTER — Other Ambulatory Visit: Payer: Self-pay | Admitting: Neurology

## 2020-05-10 ENCOUNTER — Telehealth: Payer: Self-pay | Admitting: Neurology

## 2020-05-10 NOTE — Telephone Encounter (Signed)
Pt called request refill phenytoin (DILANTIN) 100 MG ER capsule at [facility] #[id number].

## 2020-05-10 NOTE — Telephone Encounter (Signed)
Refill request has been sent to Dr Vickey Huger to review and send in for the patient on behalf of Dr Dohmeier.

## 2020-05-20 ENCOUNTER — Telehealth: Payer: Self-pay | Admitting: Neurology

## 2020-05-20 DIAGNOSIS — Z5181 Encounter for therapeutic drug level monitoring: Secondary | ICD-10-CM

## 2020-05-20 DIAGNOSIS — G35 Multiple sclerosis: Secondary | ICD-10-CM

## 2020-05-20 NOTE — Telephone Encounter (Signed)
Pt called, symptoms are worsening; twitching in right eye, numbness in left hand and dropping things. Would like a call from the nurse.

## 2020-05-21 NOTE — Addendum Note (Signed)
Addended by: York Spaniel on: 05/21/2020 12:26 PM   Modules accepted: Orders

## 2020-05-21 NOTE — Telephone Encounter (Signed)
I called the patient. The patient has history of multiple sclerosis, she is treated with Tysabri. She has had some chronic issues with hemifacial spasm on the right, some these twitches have worsened a bit recently. The patient has felt like she has been under some financial stress. She has some chronic issues with numbness of the left hand but again this seems to be a bit worse, she is dropping things. She has had no change in her balance or her ability to walk. She does have difficulty fully voiding the bladder, she is on full doses of Myrbetriq. She has seen urology previously but is not actively followed through urology.  I will go ahead and check blood work and urinalysis, check a Dilantin level. The patient will go ahead and get MRI of the brain and cervical spine, he was a year since her last study. She has a revisit in November 2021.  Patient reports some increase in generalized fatigue.

## 2020-05-22 ENCOUNTER — Other Ambulatory Visit: Payer: Self-pay | Admitting: Neurology

## 2020-05-24 ENCOUNTER — Other Ambulatory Visit: Payer: Self-pay | Admitting: Neurology

## 2020-05-24 ENCOUNTER — Telehealth: Payer: Self-pay | Admitting: Neurology

## 2020-05-24 NOTE — Telephone Encounter (Signed)
[employer] medicare/medicaid order sent to [facility]. No auth they will reach out to the patient to schedule.  °

## 2020-05-25 ENCOUNTER — Telehealth: Payer: Self-pay | Admitting: Neurology

## 2020-05-25 NOTE — Telephone Encounter (Signed)
Pt is asking for something to be called in to help her relax for her MRI at 6am. Please call, pt is asking it be called into [facility] #[id number]

## 2020-05-26 ENCOUNTER — Telehealth: Payer: Self-pay | Admitting: Neurology

## 2020-05-26 ENCOUNTER — Ambulatory Visit
Admission: RE | Admit: 2020-05-26 | Discharge: 2020-05-26 | Disposition: A | Discharge status: Home / Self Care | Payer: Medicare Other | Source: Ambulatory Visit | Attending: Neurology | Admitting: Neurology

## 2020-05-26 ENCOUNTER — Other Ambulatory Visit: Payer: Self-pay

## 2020-05-26 DIAGNOSIS — G35 Multiple sclerosis: Secondary | ICD-10-CM

## 2020-05-26 MED ORDER — GADOBENATE DIMEGLUMINE 529 MG/ML IV SOLN
15.0000 mL | Freq: Once | INTRAVENOUS | Status: AC | PRN
  Administered 2020-05-26: 15 mL via INTRAVENOUS

## 2020-05-26 NOTE — Telephone Encounter (Signed)
I received a phone call from this patient last night to the answering service after hours stating she was having an MRI today at 6 AM and needed a muscle relaxant for it.  After reviewing the chart and discussing with her I prescribed gabapentin 300 mg 3 tablets.  Prescription was sent to her pharmacy of choice [facility] at intersection of: [address].

## 2020-05-26 NOTE — Telephone Encounter (Signed)
I called the pt. She sts she was able to complete her MRI this morning. She sts she called the after hours service and was sent a prescription.. ( I could not locate in epic).  Pt was advised we would call once result were ready to be reviewed. She verbalized understanding and had no questions/ concerns.

## 2020-05-28 ENCOUNTER — Telehealth: Payer: Self-pay | Admitting: Neurology

## 2020-05-28 NOTE — Telephone Encounter (Signed)
I called the patient.  MRI of the brain and cervical spine were stable from prior studies.  She has not yet come in the office to get blood work and urinalysis, I urged her to do this next week.   MRI brain 05/27/20:  IMPRESSION:   MRI brain (with and without) demonstrating: -Stable supratentorial and infratentorial chronic demyelinating plaques. -No acute findings.  No change from 05/27/2019.   MRI cervical 05/27/20:  IMPRESSION:   MRI cervical spine with and without demonstrating: -Hazy T2 hyperintense spinal cord lesions at C2-3, C7 and T1-2 levels; consistent with stable chronic demyelinating plaques. -No acute plaques.

## 2020-06-01 NOTE — Addendum Note (Signed)
Addended by: Tamera Stands D on: 06/01/2020 11:10 AM   Modules accepted: Orders

## 2020-06-02 LAB — COMPREHENSIVE METABOLIC PANEL
ALT: 19 IU/L (ref 0–32)
AST: 23 IU/L (ref 0–40)
Albumin/Globulin Ratio: 2 (ref 1.2–2.2)
Albumin: 4.3 g/dL (ref 3.8–4.9)
Alkaline Phosphatase: 90 IU/L (ref 44–121)
BUN/Creatinine Ratio: 20 (ref 9–23)
BUN: 18 mg/dL (ref 6–24)
Bilirubin Total: <0.2 mg/dL (ref 0.0–1.2)
CO2: 24 mmol/L (ref 20–29)
Calcium: 9.3 mg/dL (ref 8.7–10.2)
Chloride: 106 mmol/L (ref 96–106)
Creatinine, Ser: 0.88 mg/dL (ref 0.57–1.00)
GFR calc Af Amer: 88 mL/min/{1.73_m2} (ref >59)
GFR calc non Af Amer: 76 mL/min/{1.73_m2} (ref >59)
Globulin, Total: 2.1 g/dL (ref 1.5–4.5)
Glucose: 89 mg/dL (ref 65–99)
Potassium: 4.6 mmol/L (ref 3.5–5.2)
Sodium: 142 mmol/L (ref 134–144)
Total Protein: 6.4 g/dL (ref 6.0–8.5)

## 2020-06-02 LAB — PHENYTOIN LEVEL, TOTAL: Phenytoin (Dilantin), Serum: 3.6 ug/mL — ABNORMAL LOW (ref 10.0–20.0)

## 2020-06-02 LAB — CBC WITH DIFFERENTIAL/PLATELET
Basophils Absolute: 0 10*3/uL (ref 0.0–0.2)
Basos: 0 %
EOS (ABSOLUTE): 0.1 10*3/uL (ref 0.0–0.4)
Eos: 2 %
Hematocrit: 36.7 % (ref 34.0–46.6)
Hemoglobin: 11.9 g/dL (ref 11.1–15.9)
Immature Grans (Abs): 0 10*3/uL (ref 0.0–0.1)
Immature Granulocytes: 0 %
Lymphocytes Absolute: 1.7 10*3/uL (ref 0.7–3.1)
Lymphs: 44 %
MCH: 29.8 pg (ref 26.6–33.0)
MCHC: 32.4 g/dL (ref 31.5–35.7)
MCV: 92 fL (ref 79–97)
Monocytes Absolute: 0.3 10*3/uL (ref 0.1–0.9)
Monocytes: 7 %
Neutrophils Absolute: 1.8 10*3/uL (ref 1.4–7.0)
Neutrophils: 47 %
Platelets: 182 10*3/uL (ref 150–450)
RBC: 3.99 x10E6/uL (ref 3.77–5.28)
RDW: 13.6 % (ref 11.7–15.4)
WBC: 3.9 10*3/uL (ref 3.4–10.8)

## 2020-06-02 LAB — URINALYSIS, ROUTINE W REFLEX MICROSCOPIC
Bilirubin, UA: NEGATIVE
Glucose, UA: NEGATIVE
Ketones, UA: NEGATIVE
Leukocytes,UA: NEGATIVE
Nitrite, UA: NEGATIVE
Protein,UA: NEGATIVE
RBC, UA: NEGATIVE
Specific Gravity, UA: 1.017 (ref 1.005–1.030)
Urobilinogen, Ur: 0.2 mg/dL (ref 0.2–1.0)
pH, UA: 7.5 (ref 5.0–7.5)

## 2020-06-03 ENCOUNTER — Telehealth: Payer: Self-pay | Admitting: Emergency Medicine

## 2020-06-03 NOTE — Telephone Encounter (Signed)
-----   Message from York Spaniel, MD sent at 06/02/2020 12:57 PM EDT ----- Blood work is unremarkable, Dilantin level is low, at baseline, urinalysis is unremarkable, no evidence of urinary tract infection.  Please call the patient. ----- Message ----- From: [facility] Lab Results In Sent: 06/02/2020   5:46 AM EDT To: York Spaniel, MD

## 2020-06-03 NOTE — Telephone Encounter (Signed)
Called patient and discussed Dr. Anne Hahn review of blood work, patient to continue on same course of treatment.  Patient was hoping something would show up in blood work to explain why she is tired.  Patient had no other questions and expressed appreciation.

## 2020-06-14 ENCOUNTER — Telehealth: Payer: Self-pay | Admitting: Neurology

## 2020-06-14 NOTE — Telephone Encounter (Signed)
The JC viral antibody panel came back negative, index is 0.13.  The patient is on Tysabri.

## 2020-06-23 ENCOUNTER — Ambulatory Visit: Payer: Medicare Other | Admitting: Family Medicine

## 2020-06-23 ENCOUNTER — Encounter: Payer: Self-pay | Admitting: Family Medicine

## 2020-06-23 NOTE — Progress Notes (Deleted)
No chief complaint on file.    HISTORY OF PRESENT ILLNESS: Today 06/23/20  Connie Robertson is a 49 y.o. female here today for follow up for RRMS. She continues Tysabri and tolerating well. Labs and imaging stable in 05/2020.   She continues gabapentin baclofen   Myrbetriq  She is followed by Dr Neale Burly for chronic migraines. She continues zonisamide, divalproex, nortriptyline and topiramate. She is taking phenytoin for hemifacial spasms.    HISTORY (copied from previous note)  Connie Robertson is a 49 year old right-handed black female with a history of multiple sclerosis.  She has some weakness of the left hand and left leg.  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  The patient remains on Tysabri, she tolerates the medication well, and she has had relatively stability on the medication.  She is currently followed through Dr. Neale Burly for her headaches, she is getting trigger point injections without much benefit currently.  She is no longer on Aimovig, she claims that she had 1 Botox injection without benefit and did not continue this because of the discomfort associated with it.  The patient is now going on Zonegran for her headache and remains on Topamax, she is on Depakote and nortriptyline as well.  She takes phenytoin for her hemifacial spasm, this has been well controlled.  She returns for further evaluation.  She continues to have problems with urinary frequency and urgency.  She is on oxybutynin for the bladder.    REVIEW OF SYSTEMS: Out of a complete 14 system review of symptoms, the patient complains only of the following symptoms, and all other reviewed systems are negative.   ALLERGIES: No Known Allergies   HOME MEDICATIONS: Outpatient Medications Prior to Visit  Medication Sig Dispense Refill  . baclofen (LIORESAL) 10 MG tablet Take 1 tablet (10 mg total) by mouth at bedtime. 15 each 0  . divalproex (DEPAKOTE) 500 MG DR tablet  TAKE 1 TABLET BY MOUTH TWICE DAILY 180 tablet 1  . gabapentin (NEURONTIN) 300 MG capsule TAKE 1 CAPSULE(300 MG) BY MOUTH TWICE DAILY 180 capsule 1  . lubiprostone (AMITIZA) 24 MCG capsule Amitiza 24 mcg capsule  TK 1 C PO BID    . meloxicam (MOBIC) 7.5 MG tablet Take 1 tablet (7.5 mg total) by mouth daily. 15 tablet 0  . MYRBETRIQ 50 MG TB24 tablet TAKE 1 TABLET(50 MG) BY MOUTH DAILY 30 tablet 0  . natalizumab (TYSABRI) 300 MG/15ML injection INFUSE 300MG  IV PER  PRESCRIBING INFORMATION  EVERY 4 WEEKS AS DIRECTED  (GIVEN AT MD OFFICE,  DISCARD UNUSED) 15 mL 3  . nortriptyline (PAMELOR) 10 MG capsule take 3 capsules at night 90 capsule 3  . phentermine 37.5 MG capsule Take 37.5 mg by mouth every morning.    . phenytoin (DILANTIN) 100 MG ER capsule TAKE 2 CAPSULES(200 MG) BY MOUTH AT BEDTIME 180 capsule 0  . topiramate (TOPAMAX) 100 MG tablet Take 100 mg by mouth daily.    . valACYclovir (VALTREX) 500 MG tablet Take 1 tablet (500 mg total) by mouth daily. 30 tablet 6  . Vitamin D, Ergocalciferol, (DRISDOL) 1.25 MG (50000 UNIT) CAPS capsule Take 50,000 Units by mouth every 7 (seven) days.    zonisamide (ZONEGRAN) 100 MG capsule Take 1 capsule (100 mg total) by mouth daily.     No facility-administered medications prior to visit.     PAST MEDICAL HISTORY: Past Medical History:  Diagnosis Date  . Abnormality of gait  11/25/2014  . Chronic daily headache 2003   Love  . Depression   . Memory disorder 02/29/2016  . MS (multiple sclerosis) (HCC) 1992   Dr. Sandria Manly  . Multiple sclerosis (HCC) 11/21/2013  . Neurogenic bladder 11/21/2013     PAST SURGICAL HISTORY: Past Surgical History:  Procedure Laterality Date  . ABLATION COLPOCLESIS  2004  . CESAREAN SECTION    . TUBAL LIGATION       FAMILY HISTORY: Family History  Problem Relation Age of Onset  . Heart failure Father   . Multiple sclerosis Sister   . Cancer Neg Hx   . Diabetes Neg Hx   . Hyperlipidemia Neg Hx   . Hypertension Neg  Hx   . Kidney disease Neg Hx   . Stroke Neg Hx      SOCIAL HISTORY: Social History   Socioeconomic History  . Marital status: Divorced    Spouse name: Not on file  . Number of children: 2  . Years of education: college  . Highest education level: Not on file  Occupational History  . Occupation: disability  Tobacco Use  . Smoking status: Never Smoker  . Smokeless tobacco: Never Used  Vaping Use  . Vaping Use: Never used  Substance and Sexual Activity  . Alcohol use: Yes    Comment: social  . Drug use: No  . Sexual activity: Yes    Birth control/protection: Surgical  Other Topics Concern  . Not on file  Social History Narrative   Patient is right handed.   Patient drinks 2 cups of coffee daily.   Social Determinants of Health   Financial Resource Strain:   . Difficulty of Paying Living Expenses: Not on file  Food Insecurity:   . Worried About Programme researcher, broadcasting/film/video in the Last Year: Not on file  . Ran Out of Food in the Last Year: Not on file  Transportation Needs:   . Lack of Transportation (Medical): Not on file  . Lack of Transportation (Non-Medical): Not on file  Physical Activity:   . Days of Exercise per Week: Not on file  . Minutes of Exercise per Session: Not on file  Stress:   . Feeling of Stress : Not on file  Social Connections:   . Frequency of Communication with Friends and Family: Not on file  . Frequency of Social Gatherings with Friends and Family: Not on file  . Attends Religious Services: Not on file  . Active Member of Clubs or Organizations: Not on file  . Attends Banker Meetings: Not on file  . Marital Status: Not on file  Intimate Partner Violence:   . Fear of Current or Ex-Partner: Not on file  . Emotionally Abused: Not on file  . Physically Abused: Not on file  . Sexually Abused: Not on file      PHYSICAL EXAM  There were no vitals filed for this visit. There is no height or weight on file to calculate  BMI.   Generalized: Well developed, in no acute distress  Cardiology: normal rate and rhythm, no murmur auscultated  Respiratory: clear to auscultation bilaterally    Neurological examination  Mentation: Alert oriented to time, place, history taking. Follows all commands speech and language fluent Cranial nerve II-XII: Pupils were equal round reactive to light. Extraocular movements were full, visual field were full on confrontational test. Facial sensation and strength were normal. Uvula tongue midline. Head turning and shoulder shrug  were normal and symmetric. Motor: The  motor testing reveals 5 over 5 strength of all 4 extremities. Good symmetric motor tone is noted throughout.  Sensory: Sensory testing is intact to soft touch on all 4 extremities. No evidence of extinction is noted.  Coordination: Cerebellar testing reveals good finger-nose-finger and heel-to-shin bilaterally.  Gait and station: Gait is normal. Tandem gait is normal. Romberg is negative. No drift is seen.  Reflexes: Deep tendon reflexes are symmetric and normal bilaterally.     DIAGNOSTIC DATA (LABS, IMAGING, TESTING) - I reviewed patient records, labs, notes, testing and imaging myself where available.  Lab Results  Component Value Date   WBC 3.9 06/01/2020   HGB 11.9 06/01/2020   HCT 36.7 06/01/2020   MCV 92 06/01/2020   PLT 182 06/01/2020      Component Value Date/Time   NA 142 06/01/2020 1112   NA 142 04/05/2015 1435   K 4.6 06/01/2020 1112   K 3.9 04/05/2015 1435   CL 106 06/01/2020 1112   CO2 24 06/01/2020 1112   CO2 24 04/05/2015 1435   GLUCOSE 89 06/01/2020 1112   GLUCOSE 93 03/23/2016 1402   GLUCOSE 89 04/05/2015 1435   BUN 18 06/01/2020 1112   BUN 18.2 04/05/2015 1435   CREATININE 0.88 06/01/2020 1112   CREATININE 1.1 04/05/2015 1435   CALCIUM 9.3 06/01/2020 1112   CALCIUM 9.3 04/05/2015 1435   PROT 6.4 06/01/2020 1112   PROT 6.9 04/05/2015 1435   ALBUMIN 4.3 06/01/2020 1112   ALBUMIN  4.2 04/05/2015 1435   AST 23 06/01/2020 1112   AST 27 04/05/2015 1435   ALT 19 06/01/2020 1112   ALT 46 04/05/2015 1435   ALKPHOS 90 06/01/2020 1112   ALKPHOS 53 04/05/2015 1435   BILITOT <0.2 06/01/2020 1112   BILITOT 0.22 04/05/2015 1435   GFRNONAA 76 06/01/2020 1112   GFRAA 88 06/01/2020 1112   Lab Results  Component Value Date   CHOL 191 08/18/2011   HDL 66.10 08/18/2011   LDLCALC 103 (H) 08/18/2011   TRIG 112.0 08/18/2011   CHOLHDL 3 08/18/2011   No results found for: HGBA1C Lab Results  Component Value Date   VITAMINB12 470 04/05/2015   Lab Results  Component Value Date   TSH 1.80 08/18/2011      ASSESSMENT AND PLAN  49 y.o. year old female  has a past medical history of Abnormality of gait (11/25/2014), Chronic daily headache (2003), Depression, Memory disorder (02/29/2016), MS (multiple sclerosis) (HCC) (1992), Multiple sclerosis (HCC) (11/21/2013), and Neurogenic bladder (11/21/2013). here with ***  No diagnosis found.   I spent 20 minutes of face-to-face and non-face-to-face time with patient.  This included previsit chart review, lab review, study review, order entry, electronic health record documentation, patient education.    Shawnie Dapper, MSN, FNP-C 06/23/2020, 7:23 AM  [facility] [address], [address] [place], [place] [address] [phone]

## 2020-06-25 ENCOUNTER — Other Ambulatory Visit: Payer: Self-pay | Admitting: Neurology

## 2020-07-14 NOTE — Progress Notes (Signed)
Chief Complaint  Patient presents with  . Follow-up    Connie Robertson, rm 1, alone      HISTORY OF PRESENT ILLNESS: Today 07/15/20  Connie Robertson is a 49 y.o. female here today for follow up for RRMS. She continues Tysabri. Labs have been normal. Last MRI stable in 05/2020.   She reports that she is stable from an Connie standpoint. No new or worsening symptoms. She continues to feel tired. She felt that she was having more concerns of facial twitching and difficulty emptying bladder in 05/2020. She admits that she was under more financial stress. Symptoms seem to be improving. No worsening numbness or weakness. She does occasionally drop things.  She continues baclofen as needed for muscle spasms.  Myrbetriq helps with urinary frequency.  Headaches seem to be well managed on zonisamide.  She is having a hard time telling me what medications she is taking. She is not sure if she is taking divalproex or phenytoin. She states that she took "extra doses" of one of these two medications for facial twitching and feels that she was having side effects. She is not sure if it was a capsule or tablet. She does have more difficulty with brain fog and short term memory loss. She is sleeping well. Mood is stable.    HISTORY (copied from previous note)  Connie Robertson is a 49 year old right-handed black female with a history of multiple sclerosis.  She has some weakness of the left hand and left leg.  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  The patient remains on Tysabri, she tolerates the medication well, and she has had relatively stability on the medication.  She is currently followed through Dr. Neale Burly for her headaches, she is getting trigger point injections without much benefit currently.  She is no longer on Aimovig, she claims that she had 1 Botox injection without benefit and did not continue this because of the discomfort associated with it.  The patient is now  going on Zonegran for her headache and remains on Topamax, she is on Depakote and nortriptyline as well.  She takes phenytoin for her hemifacial spasm, this has been well controlled.  She returns for further evaluation.  She continues to have problems with urinary frequency and urgency.  She is on oxybutynin for the bladder.   REVIEW OF SYSTEMS: Out of a complete 14 system review of symptoms, the patient complains only of the following symptoms, brain fog, facial twitching, fatigue and all other reviewed systems are negative.   ALLERGIES: No Known Allergies   HOME MEDICATIONS: Outpatient Medications Prior to Visit  Medication Sig Dispense Refill  . baclofen (LIORESAL) 10 MG tablet Take 1 tablet (10 mg total) by mouth at bedtime. 15 each 0  . gabapentin (NEURONTIN) 300 MG capsule TAKE 1 CAPSULE(300 MG) BY MOUTH TWICE DAILY 180 capsule 1  . natalizumab (TYSABRI) 300 MG/15ML injection INFUSE 300MG  IV PER  PRESCRIBING INFORMATION  EVERY 4 WEEKS AS DIRECTED  (GIVEN AT MD OFFICE,  DISCARD UNUSED) 15 mL 3  . phentermine 37.5 MG capsule Take 37.5 mg by mouth every morning.    . topiramate (TOPAMAX) 100 MG tablet Take 100 mg by mouth daily.    . valACYclovir (VALTREX) 500 MG tablet Take 1 tablet (500 mg total) by mouth daily. 30 tablet 6  . Vitamin D, Ergocalciferol, (DRISDOL) 1.25 MG (50000 UNIT) CAPS capsule Take 50,000 Units by mouth every 7 (seven) days.     zonisamide (ZONEGRAN) 100 MG capsule Take 1 capsule (100 mg total) by mouth daily.    . divalproex (DEPAKOTE) 500 MG DR tablet TAKE 1 TABLET BY MOUTH TWICE DAILY 180 tablet 1  . lubiprostone (AMITIZA) 24 MCG capsule Amitiza 24 mcg capsule  TK 1 C PO BID    . meloxicam (MOBIC) 7.5 MG tablet Take 1 tablet (7.5 mg total) by mouth daily. 15 tablet 0  . MYRBETRIQ 50 MG TB24 tablet TAKE 1 TABLET(50 MG) BY MOUTH DAILY 30 tablet 0  . nortriptyline (PAMELOR) 10 MG capsule take 3 capsules at night 90 capsule 3  . phenytoin (DILANTIN) 100 MG ER  capsule TAKE 2 CAPSULES(200 MG) BY MOUTH AT BEDTIME 180 capsule 0   No facility-administered medications prior to visit.     PAST MEDICAL HISTORY: Past Medical History:  Diagnosis Date  . Abnormality of gait 11/25/2014  . Chronic daily headache 2003   Love  . Depression   . Memory disorder 02/29/2016  . Connie (multiple sclerosis) (HCC) 1992   Dr. Sandria Manly  . Multiple sclerosis (HCC) 11/21/2013  . Neurogenic bladder 11/21/2013     PAST SURGICAL HISTORY: Past Surgical History:  Procedure Laterality Date  . ABLATION COLPOCLESIS  2004  . CESAREAN SECTION    . TUBAL LIGATION       FAMILY HISTORY: Family History  Problem Relation Age of Onset  . Heart failure Father   . Multiple sclerosis Sister   . Cancer Neg Hx   . Diabetes Neg Hx   . Hyperlipidemia Neg Hx   . Hypertension Neg Hx   . Kidney disease Neg Hx   . Stroke Neg Hx      SOCIAL HISTORY: Social History   Socioeconomic History  . Marital status: Divorced    Spouse name: Not on file  . Number of children: 2  . Years of education: college  . Highest education level: Not on file  Occupational History  . Occupation: disability  Tobacco Use  . Smoking status: Never Smoker  . Smokeless tobacco: Never Used  Vaping Use  . Vaping Use: Never used  Substance and Sexual Activity  . Alcohol use: Yes    Comment: social  . Drug use: No  . Sexual activity: Yes    Birth control/protection: Surgical  Other Topics Concern  . Not on file  Social History Narrative   Patient is right handed.   Patient drinks 2 cups of coffee daily.   Social Determinants of Health   Financial Resource Strain: Not on file  Food Insecurity: Not on file  Transportation Needs: Not on file  Physical Activity: Not on file  Stress: Not on file  Social Connections: Not on file  Intimate Partner Violence: Not on file      PHYSICAL EXAM  Vitals:   07/15/20 1117  Height: 5\' 4"  (1.626 m)   Body mass index is 33.47  kg/m.   Generalized: Well developed, in no acute distress  Cardiology: normal rate and rhythm, no murmur auscultated  Respiratory: clear to auscultation bilaterally    Neurological examination  Mentation: Alert oriented to time, place, history taking. Follows all commands speech and language fluent Cranial nerve II-XII: Pupils were equal round reactive to light. Extraocular movements were full, visual field were full on confrontational test. Facial sensation and strength were normal. Head turning and shoulder shrug  were normal and symmetric. Motor: The motor testing reveals 5 over 5 strength of all 4 extremities. Good symmetric motor tone is noted  throughout.  Sensory: Sensory testing is intact to soft touch on all 4 extremities. No evidence of extinction is noted.  Coordination: Cerebellar testing reveals good finger-nose-finger and heel-to-shin bilaterally.  Gait and station: Gait is normal.  Reflexes: Deep tendon reflexes are symmetric and normal bilaterally.     DIAGNOSTIC DATA (LABS, IMAGING, TESTING) - I reviewed patient records, labs, notes, testing and imaging myself where available.  Lab Results  Component Value Date   WBC 3.9 06/01/2020   HGB 11.9 06/01/2020   HCT 36.7 06/01/2020   MCV 92 06/01/2020   PLT 182 06/01/2020      Component Value Date/Time   NA 142 06/01/2020 1112   NA 142 04/05/2015 1435   K 4.6 06/01/2020 1112   K 3.9 04/05/2015 1435   CL 106 06/01/2020 1112   CO2 24 06/01/2020 1112   CO2 24 04/05/2015 1435   GLUCOSE 89 06/01/2020 1112   GLUCOSE 93 03/23/2016 1402   GLUCOSE 89 04/05/2015 1435   BUN 18 06/01/2020 1112   BUN 18.2 04/05/2015 1435   CREATININE 0.88 06/01/2020 1112   CREATININE 1.1 04/05/2015 1435   CALCIUM 9.3 06/01/2020 1112   CALCIUM 9.3 04/05/2015 1435   PROT 6.4 06/01/2020 1112   PROT 6.9 04/05/2015 1435   ALBUMIN 4.3 06/01/2020 1112   ALBUMIN 4.2 04/05/2015 1435   AST 23 06/01/2020 1112   AST 27 04/05/2015 1435   ALT 19  06/01/2020 1112   ALT 46 04/05/2015 1435   ALKPHOS 90 06/01/2020 1112   ALKPHOS 53 04/05/2015 1435   BILITOT <0.2 06/01/2020 1112   BILITOT 0.22 04/05/2015 1435   GFRNONAA 76 06/01/2020 1112   GFRAA 88 06/01/2020 1112   Lab Results  Component Value Date   CHOL 191 08/18/2011   HDL 66.10 08/18/2011   LDLCALC 103 (H) 08/18/2011   TRIG 112.0 08/18/2011   CHOLHDL 3 08/18/2011   No results found for: HGBA1C Lab Results  Component Value Date   VITAMINB12 470 04/05/2015   Lab Results  Component Value Date   TSH 1.80 08/18/2011    No flowsheet data found.   ASSESSMENT AND PLAN  49 y.o. year old female  has a past medical history of Abnormality of gait (11/25/2014), Chronic daily headache (2003), Depression, Memory disorder (02/29/2016), Connie (multiple sclerosis) (HCC) (1992), Multiple sclerosis (HCC) (11/21/2013), and Neurogenic bladder (11/21/2013). here with   Relapsing remitting multiple sclerosis (HCC) - Plan: Stratify JCV Ab (w/ Index) w/ Rflx, CBC with Differential/Platelets  Chronic tension-type headache, intractable  Facial myokymia - Plan: Phenytoin Level, Total, Valproic Acid Level  Neurogenic bladder  Sindhu is doing well from any Connie standpoint.  We will continue Tysabri infusions monthly.  She is unable to tell me today whether she is taken phenytoin or divalproex.  I have asked that she review her medications at home and call me with an updated medication list.  I will update labs today to assess for levels of these medications.  She will continue baclofen, Myrbetriq and zonisamide as prescribed.  Healthy lifestyle habits encouraged.  She will follow-up in 6 months, sooner if needed.  She verbalizes understanding and agreement with this plan.  Orders Placed This Encounter  Procedures  . Phenytoin Level, Total  . Stratify JCV Ab (w/ Index) w/ Rflx  . Valproic Acid Level  . CBC with Differential/Platelets      I spent 30 minutes of face-to-face and  non-face-to-face time with patient.  This included previsit chart review, lab review, study review,  order entry, electronic health record documentation, patient education.    Shawnie Dapper, MSN, FNP-C 07/15/2020, 5:11 PM  [facility] [address] [place], [place] [address] [phone]

## 2020-07-15 ENCOUNTER — Ambulatory Visit (INDEPENDENT_AMBULATORY_CARE_PROVIDER_SITE_OTHER): Payer: Medicare Other | Admitting: Family Medicine

## 2020-07-15 ENCOUNTER — Encounter: Payer: Self-pay | Admitting: Family Medicine

## 2020-07-15 ENCOUNTER — Other Ambulatory Visit: Payer: Self-pay

## 2020-07-15 VITALS — Ht 64.0 in

## 2020-07-15 DIAGNOSIS — G35 Multiple sclerosis: Secondary | ICD-10-CM | POA: Diagnosis not present

## 2020-07-15 DIAGNOSIS — G44221 Chronic tension-type headache, intractable: Secondary | ICD-10-CM

## 2020-07-15 DIAGNOSIS — N319 Neuromuscular dysfunction of bladder, unspecified: Secondary | ICD-10-CM

## 2020-07-15 DIAGNOSIS — G514 Facial myokymia: Secondary | ICD-10-CM | POA: Diagnosis not present

## 2020-07-15 MED ORDER — PHENYTOIN SODIUM EXTENDED 100 MG PO CAPS: ORAL_CAPSULE | ORAL | 0 refills | Status: DC

## 2020-07-15 MED ORDER — MIRABEGRON ER 50 MG PO TB24: ORAL_TABLET | ORAL | 11 refills | Status: DC

## 2020-07-15 NOTE — Patient Instructions (Addendum)
Below is our plan:  We will continue Tysabri. I am update labs today. I need you to call me with a full medication list with all medication names and doses that you take.   Please make sure you are staying well hydrated. I recommend 50-60 ounces daily. Well balanced diet and regular exercise encouraged.   Please continue follow up with care team as directed.   Follow up with Dr Anne Hahn in 6 months   You may receive a survey regarding today's visit. I encourage you to leave honest feed back as I do use this information to improve patient care. Thank you for seeing me today!      Multiple Sclerosis Multiple sclerosis (MS) is a disease of the brain, spinal cord, and optic nerves (central nervous system). It causes the body's disease-fighting (immune) system to destroy the protective covering (myelin sheath) around nerves in the brain. When this happens, signals (nerve impulses) going to and from the brain and spinal cord do not get sent properly or may not get sent at all. There are several types of MS:  Relapsing-remitting MS. This is the most common type. This causes sudden attacks of symptoms. After an attack, you may recover completely until the next attack, or some symptoms may remain permanently.  Secondary progressive MS. This usually develops after the onset of relapsing-remitting MS. Similar to relapsing-remitting MS, this type also causes sudden attacks of symptoms. Attacks may be less frequent, but symptoms slowly get worse (progress) over time.  Primary progressive MS. This causes symptoms that steadily progress over time. This type of MS does not cause sudden attacks of symptoms. The age of onset of MS varies, but it often develops between 72-69 years of age. MS is a lifelong (chronic) condition. There is no cure, but treatment can help slow down the progression of the disease. What are the causes? The cause of this condition is not known. What increases the risk? You are more  likely to develop this condition if:  You are a woman.  You have a relative with MS. However, the condition is not passed from parent to child (inherited).  You have a lack (deficiency) of vitamin D.  You smoke. MS is more common in the [place] than in the [place]. What are the signs or symptoms? Relapsing-remitting and secondary progressive MS cause symptoms to occur in episodes or attacks that may last weeks to months. There may be long periods between attacks in which there are almost no symptoms. Primary progressive MS causes symptoms to steadily progress after they develop. Symptoms of MS vary because of the many different ways it affects the central nervous system. The main symptoms include:  Vision problems and eye pain.  Numbness.  Weakness.  Inability to move your arms, hands, feet, or legs (paralysis).  Balance problems.  Shaking that you cannot control (tremors).  Muscle spasms.  Problems with thinking (cognitive changes). MS can also cause symptoms that are associated with the disease, but are not always the direct result of an MS attack. They may include:  Inability to control urination or bowel movements (incontinence).  Headaches.  Fatigue.  Inability to tolerate heat.  Emotional changes.  Depression.  Pain. How is this diagnosed? This condition is diagnosed based on:  Your symptoms.  A neurological exam. This involves checking central nervous system function, such as nerve function, reflexes, and coordination.  MRIs of the brain and spinal cord.  Lab tests, including a lumbar puncture that  tests the fluid that surrounds the brain and spinal cord (cerebrospinal fluid).  Tests to measure the electrical activity of the brain in response to stimulation (evoked potentials). How is this treated? There is no cure for MS, but medicines can help decrease the number and frequency of attacks and help relieve nuisance  symptoms. Treatment options may include:  Medicines that reduce the frequency of attacks. These medicines may be given by injection, by mouth (orally), or through an IV.  Medicines that reduce inflammation (steroids). These may provide short-term relief of symptoms.  Medicines to help control pain, depression, fatigue, or incontinence.  Vitamin D, if you have a deficiency.  Using devices to help you move around (assistive devices), such as braces, a cane, or a walker.  Physical therapy to strengthen and stretch your muscles.  Occupational therapy to help you with everyday tasks.  Alternative or complementary treatments such as exercise, massage, or acupuncture. Follow these instructions at home:  Take over-the-counter and prescription medicines only as told by your health care provider.  Do not drive or use heavy machinery while taking prescription pain medicine.  Use assistive devices as recommended by your physical therapist or your health care provider.  Exercise as directed by your health care provider.  Return to your normal activities as told by your health care provider. Ask your health care provider what activities are safe for you.  Reach out for support. Share your feelings with friends, family, or a support group.  Keep all follow-up visits as told by your health care provider and therapists. This is important. Where to find more information  National Multiple Sclerosis Society: https://www.nationalmssociety.org Contact a health care provider if:  You feel depressed.  You develop new pain or numbness.  You have tremors.  You have problems with sexual function. Get help right away if:  You develop paralysis.  You develop numbness.  You have problems with your bladder or bowel function.  You develop double vision.  You lose vision in one or both eyes.  You develop suicidal thoughts.  You develop severe confusion. If you ever feel like you may hurt  yourself or others, or have thoughts about taking your own life, get help right away. You can go to your nearest emergency department or call:  Your local emergency services (911 in the [place]  A suicide crisis helpline, such as the National Suicide Prevention Lifeline at 425-708-6946. This is open 24 hours a day. Summary  Multiple sclerosis (MS) is a disease of the central nervous system that causes the body's immune system to destroy the protective covering (myelin sheath) around nerves in the brain.  There are 3 types of MS: relapsing-remitting, secondary progressive, and primary progressive. Relapsing-remitting and secondary progressive MS cause symptoms to occur in episodes or attacks that may last weeks to months. Primary progressive MS causes symptoms to steadily progress after they develop.  There is no cure for MS, but medicines can help decrease the number and frequency of attacks and help relieve nuisance symptoms. Treatment may also include physical or occupational therapy.  If you develop numbness, paralysis, vision problems, or other neurological symptoms, get help right away. This information is not intended to replace advice given to you by your health care provider. Make sure you discuss any questions you have with your health care provider. Document Revised: 07/06/2017 Document Reviewed: 10/02/2016 Elsevier Patient Education  2020 [employer].

## 2020-07-15 NOTE — Progress Notes (Signed)
I have read the note, and I agree with the clinical assessment and plan.  Dayquan Buys K Yunuen Mordan   

## 2020-07-16 LAB — PHENYTOIN LEVEL, TOTAL: Phenytoin (Dilantin), Serum: 2.9 ug/mL — ABNORMAL LOW (ref 10.0–20.0)

## 2020-07-16 LAB — CBC WITH DIFFERENTIAL/PLATELET
Basophils Absolute: 0 10*3/uL (ref 0.0–0.2)
Basos: 0 %
EOS (ABSOLUTE): 0.1 10*3/uL (ref 0.0–0.4)
Eos: 2 %
Hematocrit: 37.4 % (ref 34.0–46.6)
Hemoglobin: 12.6 g/dL (ref 11.1–15.9)
Immature Grans (Abs): 0 10*3/uL (ref 0.0–0.1)
Immature Granulocytes: 0 %
Lymphocytes Absolute: 2.5 10*3/uL (ref 0.7–3.1)
Lymphs: 52 %
MCH: 30.4 pg (ref 26.6–33.0)
MCHC: 33.7 g/dL (ref 31.5–35.7)
MCV: 90 fL (ref 79–97)
Monocytes Absolute: 0.4 10*3/uL (ref 0.1–0.9)
Monocytes: 8 %
NRBC: 1 % — ABNORMAL HIGH (ref 0–0)
Neutrophils Absolute: 1.8 10*3/uL (ref 1.4–7.0)
Neutrophils: 38 %
Platelets: 180 10*3/uL (ref 150–450)
RBC: 4.15 x10E6/uL (ref 3.77–5.28)
RDW: 13.1 % (ref 11.7–15.4)
WBC: 4.7 10*3/uL (ref 3.4–10.8)

## 2020-07-16 LAB — VALPROIC ACID LEVEL: Valproic Acid Lvl: <4 ug/mL — ABNORMAL LOW (ref 50–100)

## 2020-07-20 ENCOUNTER — Telehealth: Payer: Self-pay | Admitting: *Deleted

## 2020-07-20 NOTE — Telephone Encounter (Signed)
-----   Message from Shawnie Dapper, NP sent at 07/19/2020  2:04 PM EST ----- Can you please call patient and have her verify medication list. She was seen last week and not sure of what she was taking. Labs were ok but show phenytoin and divalproex. She states she was not taking both. She was going to call us with an update medication list but not sure she has had a chance to do so. TY.

## 2020-07-20 NOTE — Telephone Encounter (Signed)
Called LMVM for pt to return call for lab results and update her medlist.

## 2020-07-20 NOTE — Telephone Encounter (Signed)
I called pt and gave her lab results.  She verbalized understanding.  I updated her med list.  She never got zonisamide.  Out of divalproex (empty bottle) she needed more.  Taking topiramate and phenytoin.

## 2020-07-20 NOTE — Telephone Encounter (Signed)
Per Dr Anne Hahn' previous note she should be taking divalproex 500mg  twice daily, nortriptyline 30mg  at bedtime and zonisamide 100mg  daily for headaches. She was also on topiramate 100mg  daily for headaches and this was being prescribed by PCP. She was taking phenytoin 200mg  at bedtime for facial spasms. Baclofen 10mg  up to TID for muscle spasms/spasticity and gabapentin 300mg  twice daily for dysesthesias. mirabegron 50mg  daily for urinary frequency. This is the recommended treatment regimen last discussed.

## 2020-07-20 NOTE — Telephone Encounter (Signed)
-----   Message from Amy Lomax, NP sent at 07/19/2020  2:04 PM EST ----- Can you please call patient and have her verify medication list. She was seen last week and not sure of what she was taking. Labs were ok but show phenytoin and divalproex. She states she was not taking both. She was going to call us with an update medication list but not sure she has had a chance to do so. TY.  

## 2020-07-22 ENCOUNTER — Telehealth: Payer: Self-pay | Admitting: Emergency Medicine

## 2020-07-22 NOTE — Telephone Encounter (Signed)
Message from Plan Request Reference Number: [id number]. TYSABRI INJ 300/15ML is approved through 08/06/2021.

## 2020-07-22 NOTE — Telephone Encounter (Signed)
Tysabri PA started on CMM.  Key [id number].  Awaiting determination from [employer].

## 2020-07-23 LAB — STRATIFY JCV AB (W/ INDEX) W/ RFLX
Index Value: 0.15
Stratify JCV (TM) Ab w/Reflex Inhibition: NEGATIVE

## 2020-07-27 ENCOUNTER — Telehealth: Payer: Self-pay | Admitting: *Deleted

## 2020-07-27 NOTE — Telephone Encounter (Signed)
I relayed that her JCV antibody test is negative.  If she had questions to call me back.  I know she was in and saw AMY, NP and was told what medications she was to take, if she had questions to give a call back.

## 2020-08-10 ENCOUNTER — Telehealth: Payer: Self-pay

## 2020-08-10 NOTE — Telephone Encounter (Signed)
Pt left a VM this morning asking for a call to discuss changing her DMT. Please call.

## 2020-08-10 NOTE — Telephone Encounter (Signed)
She is asking about financial assistance and will contact [employer] about PAP and then get back to [place] after that.

## 2020-08-11 ENCOUNTER — Other Ambulatory Visit: Payer: Self-pay | Admitting: Neurology

## 2020-08-12 ENCOUNTER — Telehealth: Payer: Self-pay | Admitting: Neurology

## 2020-08-12 NOTE — Telephone Encounter (Signed)
I called the patient, left message, I will call back later. 

## 2020-08-12 NOTE — Telephone Encounter (Signed)
Called patient and LVM requesting call back to further discuss her questions or concerns.

## 2020-08-12 NOTE — Telephone Encounter (Signed)
Pt would like to discuss getting off Tysabri infusions, wants to speak before next available appointment in March. Please advise.

## 2020-08-13 NOTE — Telephone Encounter (Signed)
I called the patient.  The patient was concerned about the cost of the Tysabri, but all MS medications are very expensive, not sure she will save money by switching to another drug.  She is feeling very good on the dysarthria, she is low risk with negative JC antibody panel, would not recommend switching.  She was concerned that if she tried to get a job that she would not have good coverage, she is talking about working part-time which means she probably would not get benefits with the job and would stay on her current Medicaid.

## 2020-09-17 ENCOUNTER — Other Ambulatory Visit: Payer: Self-pay | Admitting: Neurology

## 2020-09-17 DIAGNOSIS — N319 Neuromuscular dysfunction of bladder, unspecified: Secondary | ICD-10-CM

## 2020-09-17 DIAGNOSIS — G35 Multiple sclerosis: Secondary | ICD-10-CM

## 2020-10-21 ENCOUNTER — Telehealth: Payer: Self-pay | Admitting: Family Medicine

## 2020-10-21 NOTE — Telephone Encounter (Signed)
Tysabri reauth form signed,  faxed to [employer], received confirmation. Next Infusion 10/25/20.

## 2020-10-21 NOTE — Telephone Encounter (Signed)
Called [employer], reached Connie Robertson and informed her there's no documentation in EMR that Tysabri reauth form was received. She will notify patient's case manager and [employer] program, will fax form over today.

## 2020-10-21 NOTE — Telephone Encounter (Signed)
Marcelino Duster @ [employer] is asking for a call re: the status of the Tysabri reauth forms being sent back to them.  Marcelino Duster stated if they dont get that back today pt will not be able to have her scheduled infusion for 03-21.  Marcelino Duster is in office until 1, if calling after anyone can assist just don't use ext [phone]

## 2020-10-21 NOTE — Telephone Encounter (Signed)
Received Tysabri reauth form. Placed on MD's desk for completion, signature.

## 2020-11-02 ENCOUNTER — Telehealth: Payer: Self-pay | Admitting: Neurology

## 2020-11-02 NOTE — Telephone Encounter (Signed)
Okay with me 

## 2020-11-02 NOTE — Telephone Encounter (Signed)
Patient called she is a patient of Dr. Anne Hahn but since Dr. Anne Hahn is retiring she is wondering if she can make an appointment with Dr. Epimenio Foot.

## 2020-11-02 NOTE — Telephone Encounter (Addendum)
Spoke with Dr. Epimenio Foot. Pt should keep next follow up with Dr. Anne Hahn on 01/13/21. She can make next appt after that with Dr. Epimenio Foot.  Tried calling pt, mailbox full. If she calls, please make office visit with Dr. Epimenio Foot around 07/15/21.

## 2020-11-02 NOTE — Telephone Encounter (Signed)
Dr. Anne Hahn Dr. Epimenio Foot- are you ok with this?

## 2020-11-02 NOTE — Telephone Encounter (Signed)
That would be fine 

## 2020-11-27 ENCOUNTER — Other Ambulatory Visit: Payer: Self-pay | Admitting: Neurology

## 2020-12-26 ENCOUNTER — Other Ambulatory Visit: Payer: Self-pay | Admitting: Neurology

## 2021-01-05 ENCOUNTER — Telehealth: Payer: Self-pay | Admitting: Neurology

## 2021-01-05 DIAGNOSIS — G35 Multiple sclerosis: Secondary | ICD-10-CM

## 2021-01-05 DIAGNOSIS — Z5181 Encounter for therapeutic drug level monitoring: Secondary | ICD-10-CM

## 2021-01-05 DIAGNOSIS — R269 Unspecified abnormalities of gait and mobility: Secondary | ICD-10-CM

## 2021-01-05 NOTE — Telephone Encounter (Signed)
Placed lab orders for dr Anne Hahn patient.

## 2021-01-06 LAB — CBC WITH DIFFERENTIAL/PLATELET
Basophils Absolute: 0 10*3/uL (ref 0.0–0.2)
Basos: 0 %
EOS (ABSOLUTE): 0.1 10*3/uL (ref 0.0–0.4)
Eos: 3 %
Hematocrit: 37.5 % (ref 34.0–46.6)
Hemoglobin: 12.6 g/dL (ref 11.1–15.9)
Immature Grans (Abs): 0 10*3/uL (ref 0.0–0.1)
Immature Granulocytes: 0 %
Lymphocytes Absolute: 2.2 10*3/uL (ref 0.7–3.1)
Lymphs: 46 %
MCH: 31.2 pg (ref 26.6–33.0)
MCHC: 33.6 g/dL (ref 31.5–35.7)
MCV: 93 fL (ref 79–97)
Monocytes Absolute: 0.4 10*3/uL (ref 0.1–0.9)
Monocytes: 8 %
NRBC: 1 % — ABNORMAL HIGH (ref 0–0)
Neutrophils Absolute: 2 10*3/uL (ref 1.4–7.0)
Neutrophils: 43 %
Platelets: 168 10*3/uL (ref 150–450)
RBC: 4.04 x10E6/uL (ref 3.77–5.28)
RDW: 13.2 % (ref 11.7–15.4)
WBC: 4.6 10*3/uL (ref 3.4–10.8)

## 2021-01-06 NOTE — Telephone Encounter (Signed)
Absolutely normal CBC diff.  NRBC has been present for years.

## 2021-01-10 ENCOUNTER — Telehealth: Payer: Self-pay | Admitting: *Deleted

## 2021-01-10 LAB — STRATIFY JCV AB (W/ INDEX) W/ RFLX
Index Value: 0.12
Stratify JCV (TM) Ab w/Reflex Inhibition: NEGATIVE

## 2021-01-10 NOTE — Telephone Encounter (Signed)
-----   Message from Melvyn Novas, MD sent at 01/06/2021 12:07 PM EDT ----- Absolutely normal CBC diff.  NRBC has been present for years.

## 2021-01-10 NOTE — Telephone Encounter (Signed)
I have tried to contact the patient twice today. No answer and mailbox is full. I will try again tomorrow. She does not have an active mychart account.

## 2021-01-11 NOTE — Telephone Encounter (Signed)
I was able to speak to the patient this morning. She verbalized understanding of there lab findings.

## 2021-01-12 ENCOUNTER — Encounter: Payer: Self-pay | Admitting: Neurology

## 2021-01-12 ENCOUNTER — Ambulatory Visit (INDEPENDENT_AMBULATORY_CARE_PROVIDER_SITE_OTHER): Payer: Medicare Other | Admitting: Neurology

## 2021-01-12 VITALS — BP 112/75 | Ht 65.0 in | Wt 217.4 lb

## 2021-01-12 DIAGNOSIS — Z5181 Encounter for therapeutic drug level monitoring: Secondary | ICD-10-CM | POA: Diagnosis not present

## 2021-01-12 DIAGNOSIS — N319 Neuromuscular dysfunction of bladder, unspecified: Secondary | ICD-10-CM | POA: Diagnosis not present

## 2021-01-12 DIAGNOSIS — G35 Multiple sclerosis: Secondary | ICD-10-CM

## 2021-01-12 MED ORDER — MODAFINIL 100 MG PO TABS: 100.0000 mg | ORAL_TABLET | Freq: Every day | ORAL | 3 refills | Status: DC

## 2021-01-12 NOTE — Progress Notes (Signed)
Reason for visit: Multiple sclerosis, relapsing remitting  Connie Robertson is an 50 y.o. female  History of present illness:  Connie Robertson is a 50 year old right-handed black female with history of multiple sclerosis.  The patient has been treated with Tysabri, she tolerates this well and she has been stable on the medication.  She does have problems with a neurogenic bladder and a gait disorder.  She has not had any falls since last seen.  She does report significant fatigue associated with the heat.  She reports some leg pain, she takes gabapentin and Dilantin for this.  She has had intractable headaches, she has been followed by Dr. Neale Burly without success.  She has been getting injections that have done little good for her headache, Botox in the past has offered no benefit, and a trial on Aimovig did not help.  She is on Depakote and Topamax, she claims that she is off of her Zonegran.  These medications have not been effective.  She reports some blurring of vision, no loss of vision.  She denies any new numbness or weakness of the face, arms, legs.  Past Medical History:  Diagnosis Date  . Abnormality of gait 11/25/2014  . Chronic daily headache 2003   [place]  . Depression   . Memory disorder 02/29/2016  . MS (multiple sclerosis) (HCC) 1992   Dr. Sandria Manly  . Multiple sclerosis (HCC) 11/21/2013  . Neurogenic bladder 11/21/2013    Past Surgical History:  Procedure Laterality Date  . ABLATION COLPOCLESIS  2004  . CESAREAN SECTION    . TUBAL LIGATION      Family History  Problem Relation Age of Onset  . Heart failure Father   . Multiple sclerosis Sister   . Cancer Neg Hx   . Diabetes Neg Hx   . Hyperlipidemia Neg Hx   . Hypertension Neg Hx   . Kidney disease Neg Hx   . Stroke Neg Hx     Social history:  reports that she has never smoked. She has never used smokeless tobacco. She reports current alcohol use. She reports that she does not use drugs.   No Known  Allergies  Medications:  Prior to Admission medications   Medication Sig Start Date End Date Taking? Authorizing Provider  divalproex (DEPAKOTE ER) 500 MG 24 hr tablet Take by mouth daily.   Yes [provider]  folic acid (FOLVITE) 1 MG tablet Take 1 mg by mouth daily.   Yes [provider]  gabapentin (NEURONTIN) 300 MG capsule TAKE 1 CAPSULE(300 MG) BY MOUTH TWICE DAILY 10/23/18  Yes York Spaniel, MD  MYRBETRIQ 50 MG TB24 tablet TAKE 1 TABLET BY MOUTH EVERY DAY 09/20/20  Yes York Spaniel, MD  phentermine 37.5 MG capsule Take 37.5 mg by mouth every morning.   Yes [provider]  phenytoin (DILANTIN) 100 MG ER capsule Take 2 capsules at night 07/15/20  Yes Lomax, Amy, NP  topiramate (TOPAMAX) 200 MG tablet Take 100 mg by mouth daily.   Yes [provider]  TYSABRI 300 MG/15ML injection INFUSE 300MG  IV PER  PRESCRIBING INFORMATION  EVERY 4 WEEKS AS DIRECTED  (DISCARD UNUSED) 12/27/20  Yes 12/29/20, MD  valACYclovir (VALTREX) 500 MG tablet Take 1 tablet (500 mg total) by mouth daily. 08/20/17  Yes 08/22/17, PA-C  Vitamin D, Ergocalciferol, (DRISDOL) 1.25 MG (50000 UNIT) CAPS capsule Take 50,000 Units by mouth every 7 (seven) days.   Yes [provider]  zonisamide (ZONEGRAN) 100 MG capsule Take 1 capsule (100 mg total) by mouth daily. 12/18/19  Yes York Spaniel, MD  baclofen (LIORESAL) 10 MG tablet Take 1 tablet (10 mg total) by mouth at bedtime. 09/24/17   Waldon Merl, PA-C    ROS:  Out of a complete 14 system review of symptoms, the patient complains only of the following symptoms, and all other reviewed systems are negative.  Fatigue Walking difficulty Daily headaches  Blood pressure 112/75, height 5\' 5"  (1.651 m), weight 217 lb 6.4 oz (98.6 kg).  Physical Exam  General: The patient is alert and cooperative at the time of the examination.  The patient is moderately obese.  Skin: No significant peripheral  edema is noted.   Neurologic Exam  Mental status: The patient is alert and oriented x 3 at the time of the examination. The patient has apparent normal recent and remote memory, with an apparently normal attention span and concentration ability.   Cranial nerves: Facial symmetry is present. Speech is normal, no aphasia or dysarthria is noted. Extraocular movements are full. Visual fields are full.  Pupils are equal, round, and reactive to light.  Discs are flat bilaterally.  Motor: The patient has good strength in all 4 extremities.  Sensory examination: Soft touch sensation is symmetric on the face, arms, and legs, with exception some decreased sensation on the left leg.  Coordination: The patient has good finger-nose-finger and heel-to-shin bilaterally.  Gait and station: The patient has a wide-based gait, she can walk independently.  Tandem gait is unsteady.  Romberg is negative but is slightly unsteady.  Reflexes: Deep tendon reflexes are symmetric.   MRI brain 05/26/20:  IMPRESSION:   MRI brain (with and without) demonstrating: -Stable supratentorial and infratentorial chronic demyelinating plaques. -No acute findings.  No change from 05/27/2019.  * MRI scan images were reviewed online. I agree with the written report.   MRI cervical 05/26/20:  IMPRESSION:   MRI cervical spine with and without demonstrating: -Hazy T2 hyperintense spinal cord lesions at C2-3, C7 and T1-2 levels; consistent with stable chronic demyelinating plaques. -No acute plaques.   Assessment/Plan:  1.  Multiple sclerosis, relapsing remitting  2.  Gait disorder  3.  Neurogenic bladder  The patient will continue the Tysabri, she will have blood work done today.  She is having a lot of fatigue issues, we will try to start her on low-dose Provigil to see if this helps.  She will be tapered off of the Depakote going to 500 mg daily for 2 weeks and then stop the drug.  She will follow-up here in  6 months, she can be followed by Dr. 05/28/20 in the future.  Epimenio Foot MD 01/12/2021 3:03 PM  [facility] [address] [place], [place]  Phone [phone] Fax [phone]

## 2021-01-12 NOTE — Patient Instructions (Signed)
With the Depakote (divalproex) 500 mg tablet, take one a day for 2 weeks, then stop the medication.  We will try to start provigil 100 mg for fatigue.

## 2021-01-13 ENCOUNTER — Telehealth: Payer: Self-pay | Admitting: Emergency Medicine

## 2021-01-13 ENCOUNTER — Ambulatory Visit: Payer: Medicare Other | Admitting: Neurology

## 2021-01-13 LAB — CBC WITH DIFFERENTIAL/PLATELET
Basophils Absolute: 0 10*3/uL (ref 0.0–0.2)
Basos: 0 %
EOS (ABSOLUTE): 0.1 10*3/uL (ref 0.0–0.4)
Eos: 2 %
Hematocrit: 36.4 % (ref 34.0–46.6)
Hemoglobin: 12.1 g/dL (ref 11.1–15.9)
Immature Grans (Abs): 0 10*3/uL (ref 0.0–0.1)
Immature Granulocytes: 0 %
Lymphocytes Absolute: 2.4 10*3/uL (ref 0.7–3.1)
Lymphs: 51 %
MCH: 30.6 pg (ref 26.6–33.0)
MCHC: 33.2 g/dL (ref 31.5–35.7)
MCV: 92 fL (ref 79–97)
Monocytes Absolute: 0.4 10*3/uL (ref 0.1–0.9)
Monocytes: 9 %
NRBC: 1 % — ABNORMAL HIGH (ref 0–0)
Neutrophils Absolute: 1.9 10*3/uL (ref 1.4–7.0)
Neutrophils: 38 %
Platelets: 172 10*3/uL (ref 150–450)
RBC: 3.96 x10E6/uL (ref 3.77–5.28)
RDW: 13.1 % (ref 11.7–15.4)
WBC: 4.9 10*3/uL (ref 3.4–10.8)

## 2021-01-13 LAB — COMPREHENSIVE METABOLIC PANEL
ALT: 17 IU/L (ref 0–32)
AST: 14 IU/L (ref 0–40)
Albumin/Globulin Ratio: 2.8 — ABNORMAL HIGH (ref 1.2–2.2)
Albumin: 4.8 g/dL (ref 3.8–4.9)
Alkaline Phosphatase: 82 IU/L (ref 44–121)
BUN/Creatinine Ratio: 17 (ref 9–23)
BUN: 17 mg/dL (ref 6–24)
Bilirubin Total: <0.2 mg/dL (ref 0.0–1.2)
CO2: 23 mmol/L (ref 20–29)
Calcium: 8.9 mg/dL (ref 8.7–10.2)
Chloride: 107 mmol/L — ABNORMAL HIGH (ref 96–106)
Creatinine, Ser: 1.02 mg/dL — ABNORMAL HIGH (ref 0.57–1.00)
Globulin, Total: 1.7 g/dL (ref 1.5–4.5)
Glucose: 78 mg/dL (ref 65–99)
Potassium: 4.3 mmol/L (ref 3.5–5.2)
Sodium: 143 mmol/L (ref 134–144)
Total Protein: 6.5 g/dL (ref 6.0–8.5)
eGFR: 67 mL/min/{1.73_m2} (ref >59)

## 2021-01-13 NOTE — Telephone Encounter (Signed)
LVM for return call. 

## 2021-01-13 NOTE — Telephone Encounter (Signed)
-----   Message from York Spaniel, MD sent at 01/13/2021  6:51 AM EDT ----- Blood work is unremarkable exception of slight elevation in chloride level, likely related to use of Topamax, CBC is unremarkable.  Please call the patient. ----- Message ----- From: [facility] Lab Results In Sent: 01/13/2021   6:01 AM EDT To: York Spaniel, MD

## 2021-01-18 ENCOUNTER — Telehealth: Payer: Self-pay | Admitting: *Deleted

## 2021-01-18 NOTE — Telephone Encounter (Signed)
Modafinil PA, key:  [id number]. [employer] is reviewing your PA request. Typically an electronic response will be received within 24-72 hours.

## 2021-01-18 NOTE — Telephone Encounter (Signed)
Modafinil approved through Medicare Part D until 07/20/21. Faxed approval letter to pharmacy.

## 2021-01-19 ENCOUNTER — Telehealth: Payer: Self-pay | Admitting: Neurology

## 2021-01-19 NOTE — Telephone Encounter (Signed)
Pt called, letter stating proof of [place] for hardship application. Would like a call from the nurse.

## 2021-01-20 ENCOUNTER — Telehealth: Payer: Self-pay | Admitting: Emergency Medicine

## 2021-01-20 ENCOUNTER — Encounter: Payer: Self-pay | Admitting: Neurology

## 2021-01-20 NOTE — Telephone Encounter (Signed)
-----   Message from Charles K Willis, MD sent at 01/13/2021  6:51 AM EDT ----- Blood work is unremarkable exception of slight elevation in chloride level, likely related to use of Topamax, CBC is unremarkable.  Please call the patient. ----- Message ----- From: Interface, [facility] Lab Results In Sent: 01/13/2021   6:01 AM EDT To: Charles K Willis, MD   

## 2021-01-20 NOTE — Telephone Encounter (Signed)
Reached patient, informed her Dr. Anne Hahn has dictated her letter as requested.  It will be at the front desk for pick up.  Patient denied further questions, verbalized understanding and expressed appreciation for the phone call.

## 2021-01-20 NOTE — Telephone Encounter (Signed)
I called the patient.  Unable to reach her, I will dictate a letter, I will have my nurse call her regarding this.

## 2021-01-20 NOTE — Telephone Encounter (Signed)
Called patient and discussed Dr. Clarisa Kindred review and findings regarding blood work.  Patient denied further questions, verbalized understanding and expressed appreciation for the phone call.

## 2021-01-24 ENCOUNTER — Telehealth: Payer: Self-pay | Admitting: Neurology

## 2021-01-24 NOTE — Telephone Encounter (Signed)
JC viral antibody panel is negative with an index value of 0.12.

## 2021-01-24 NOTE — Telephone Encounter (Signed)
Tried to call patient to notify her letter of MS diagnosis/hardship has been completed by Dr. Anne Hahn.  Unable to LVM d/t mailbox being full.

## 2021-01-25 ENCOUNTER — Telehealth: Payer: Self-pay | Admitting: Emergency Medicine

## 2021-01-25 NOTE — Telephone Encounter (Signed)
error 

## 2021-01-25 NOTE — Telephone Encounter (Signed)
Letter mailed to patients address on file

## 2021-01-25 NOTE — Telephone Encounter (Signed)
Tried to call patient but unable to LVM. Received message mailbox is full

## 2021-01-27 NOTE — Telephone Encounter (Signed)
I called the patient back. She asked for her letter to also address her migraines. She has been under the care of Dr. Neale Burly for this diagnosis. She is going to speak to him about a separate letter.

## 2021-01-27 NOTE — Telephone Encounter (Signed)
Pt called, I received the letter, but did not state my condition. Would like a call from the nurse to discuss what needs to be included in the letter for my hardship application.

## 2021-02-02 ENCOUNTER — Telehealth: Payer: Self-pay | Admitting: Family Medicine

## 2021-02-02 NOTE — Telephone Encounter (Signed)
I sent SMS notification re: pts message.  Could not LM VM full.

## 2021-02-02 NOTE — Telephone Encounter (Signed)
Pt would like a refill of her blood pressure medication as she stopped taking it because she thought her pressure was better, but now she needs it again. She would like it sent to [facility] on [address]. Please advise.

## 2021-02-03 NOTE — Telephone Encounter (Signed)
I called pt has not returned SMS message.  VM full, could not LM for her that returned call.

## 2021-02-09 NOTE — Telephone Encounter (Signed)
I returned pt call, VM unavailable.  Connie Robertson tried to contact pt multiple times just to inform her that we are unaware of what blood pressure medication she was referring to as we do not manage blood pressure medication, she may need to contact prescriber or PCP.

## 2021-02-09 NOTE — Telephone Encounter (Signed)
Pt has returned the call to Sandy,RN.  Please call pt back 

## 2021-02-24 ENCOUNTER — Other Ambulatory Visit: Payer: Self-pay | Admitting: Internal Medicine

## 2021-02-24 DIAGNOSIS — E2839 Other primary ovarian failure: Secondary | ICD-10-CM

## 2021-03-14 ENCOUNTER — Telehealth: Payer: Self-pay | Admitting: Neurology

## 2021-03-14 NOTE — Telephone Encounter (Signed)
Called pt. Scheduled work in appt w/ Dr. Epimenio Foot 03/15/21 at 3p, check in 230p. Pt verbalized understanding and appreciation.

## 2021-03-14 NOTE — Telephone Encounter (Addendum)
I returned the patients call. Pt reports over the weekend she developed weakness/pain in her legs and is now having trouble walking. She states these are new symptoms for her and has never had any trouble with walking before. Her leg pain is worse at night when she is trying to sleep. She denies any new infections and stays well hydrated. Pt is currently on tysabri for MS treatment and next infusion is due the end of August. Pt also reports she may be interested in discussing new MS treatments, namely a oral DMT?   Patient has been seeing Dr. Anne Hahn Amy L NP. Last visit was 01/12/2021, with Dr. Anne Hahn and has been scheduled for follow up with Dr. Epimenio Foot in December of 2022 due to Dr. Anne Hahn retirement.  I will send this message to Dr. Epimenio Foot since Dr. Anne Hahn is out of the office and this is a new issue. Might be beneficial to work the patient in sooner with Dr. Epimenio Foot?

## 2021-03-14 NOTE — Telephone Encounter (Signed)
Pt states since the weekend she has had extreme difficulty walking, she would like a call to discuss.

## 2021-03-15 ENCOUNTER — Ambulatory Visit (INDEPENDENT_AMBULATORY_CARE_PROVIDER_SITE_OTHER): Payer: Medicare Other | Admitting: Neurology

## 2021-03-15 ENCOUNTER — Encounter: Payer: Self-pay | Admitting: Neurology

## 2021-03-15 ENCOUNTER — Other Ambulatory Visit: Payer: Self-pay

## 2021-03-15 VITALS — BP 121/79 | HR 70 | Ht 64.5 in | Wt 216.0 lb

## 2021-03-15 DIAGNOSIS — R269 Unspecified abnormalities of gait and mobility: Secondary | ICD-10-CM

## 2021-03-15 DIAGNOSIS — N319 Neuromuscular dysfunction of bladder, unspecified: Secondary | ICD-10-CM | POA: Diagnosis not present

## 2021-03-15 DIAGNOSIS — G35 Multiple sclerosis: Secondary | ICD-10-CM

## 2021-03-15 DIAGNOSIS — G44221 Chronic tension-type headache, intractable: Secondary | ICD-10-CM

## 2021-03-15 MED ORDER — METHOCARBAMOL 500 MG PO TABS: 500.0000 mg | ORAL_TABLET | Freq: Three times a day (TID) | ORAL | 1 refills | Status: DC

## 2021-03-15 MED ORDER — PREDNISONE 50 MG PO TABS: ORAL_TABLET | ORAL | 0 refills | Status: DC

## 2021-03-15 NOTE — Progress Notes (Signed)
[facility]  PATIENT: Connie Robertson DOB: 05-Apr-1971  REFERRING DOCTOR OR PCP: Dr. Anne Hahn SOURCE: Patient, notes from Dr. Anne Hahn, imaging and lab reports.  _________________________________   HISTORICAL  CHIEF COMPLAINT:  Chief Complaint  Patient presents with   Follow-up    Pt alone, rm 1. Presents today per Dr Bonnita Hollow request to evaluate new MS symptoms she reported from the weekend.  Pt receives tysabri at [facility] infusion. Last infusion was 03/02/21 and next is due 03/30/2021.    HISTORY OF PRESENT ILLNESS:  She is a 50 year old woman with relapsing remitting MS currently on Tysabri therapy  Update 03/15/2021: At baseline, she has some numbness in her legs and notes gait due to balance and minimal weakness.  She uses a cane.  She has had a couple falls.  She also has a neurogenic bladder.  She has some baseline pain in the legs that has been helped by gabapentin and Dilantin mildly.  Since 5 days ago, she is experiencing more tingling and pain in her legs, left slightly more than right..   Pain was worse with walking.     She continues to experience migraines on a near daily basis..  These are about the same now as they were last month.  MS history: She was diagnosed with MS about 25 years.   She presented right facial weakness and twitching.   Initially she was felt to have Bells Palsy.  Studies were consistent with MS.   She was initially on the interferons and glatiramer.  She saw Dr. Sandria Manly initially and then moved to the [facility].  She switched to Gilenya and tolerated it well.   However, she began to experience exacerbation (vision, numbness, balance).   She moved to [facility] and switched to Tysabri in 2017 due to the breakthrough activity she has tolerated Tysabri well..   She is JCV antibody negative last checked 01/24/2021.  Migraine history: She has a long history of chronic migraine.  She currently is seeing Dr. Neale Burly.  Injections have not helped.   She did not get a benefit from Botox or Aimovig.  She is on Depakote and Topamax without much benefit.  When migraine occurs she notes visual blurring.   Imaging:  MRI of the brain 05/26/2020 shows multiple T2/FLAIR hyperintense foci in the periventricular, juxtacortical, and deep white matter of the hemispheres and in the pons and cerebellum.  There was no change compared to 05/27/2019  MRI of the cervical spine 05/26/2020 showed multiple foci within the spinal cord.  There is a large T2 hyperintense focus posteriorly to the left adjacent to C3 and smaller foci laterally to the right adjacent to C3, laterally to the right adjacent to C4, laterally to the left adjacent to C6, laterally to the left adjacent to C7, posteriorly adjacent to C7-T1 and laterally to the right adjacent to T1.  No change compared to the MRI 09/05/2018.  She also had mild degenerative changes at C5-C6 and C6-C7 but no nerve root compression or spinal stenosis.  REVIEW OF SYSTEMS: Constitutional: No fevers, chills, sweats, or change in appetite Eyes: No visual changes, double vision, eye pain Ear, nose and throat: No hearing loss, ear pain, nasal congestion, sore throat Cardiovascular: No chest pain, palpitations Respiratory:  No shortness of breath at rest or with exertion.   No wheezes GastrointestinaI: No nausea, vomiting, diarrhea, abdominal pain, fecal incontinence Genitourinary:  No dysuria, urinary retention or frequency.  No nocturia. Musculoskeletal:  No neck pain, back pain Integumentary: No  rash, pruritus, skin lesions Neurological: as above Psychiatric: No depression at this time.  No anxiety Endocrine: No palpitations, diaphoresis, change in appetite, change in weigh or increased thirst Hematologic/Lymphatic:  No anemia, purpura, petechiae. Allergic/Immunologic: No itchy/runny eyes, nasal congestion, recent allergic reactions, rashes  ALLERGIES: No Known Allergies  HOME MEDICATIONS:  Current Outpatient  Medications:    folic acid (FOLVITE) 1 MG tablet, Take 1 mg by mouth daily., Disp: , Rfl:    gabapentin (NEURONTIN) 300 MG capsule, TAKE 1 CAPSULE(300 MG) BY MOUTH TWICE DAILY, Disp: 180 capsule, Rfl: 1   methocarbamol (ROBAXIN) 500 MG tablet, Take 1 tablet (500 mg total) by mouth 3 (three) times daily., Disp: 90 tablet, Rfl: 1   modafinil (PROVIGIL) 100 MG tablet, Take 1 tablet (100 mg total) by mouth daily., Disp: 30 tablet, Rfl: 3   MYRBETRIQ 50 MG TB24 tablet, TAKE 1 TABLET BY MOUTH EVERY DAY, Disp: 30 tablet, Rfl: 11   phentermine 37.5 MG capsule, Take 37.5 mg by mouth every morning., Disp: , Rfl:    phenytoin (DILANTIN) 100 MG ER capsule, Take 2 capsules at night, Disp: 180 capsule, Rfl: 0   predniSONE (DELTASONE) 50 MG tablet, 10 pills po qd x 3 days, Disp: 30 tablet, Rfl: 0   topiramate (TOPAMAX) 200 MG tablet, Take 100 mg by mouth daily., Disp: , Rfl:    TYSABRI 300 MG/15ML injection, INFUSE 300MG  IV PER  PRESCRIBING INFORMATION  EVERY 4 WEEKS AS DIRECTED  (DISCARD UNUSED), Disp: 15 mL, Rfl: 3   valACYclovir (VALTREX) 500 MG tablet, Take 1 tablet (500 mg total) by mouth daily., Disp: 30 tablet, Rfl: 6   Vitamin D, Ergocalciferol, (DRISDOL) 1.25 MG (50000 UNIT) CAPS capsule, Take 50,000 Units by mouth every 7 (seven) days., Disp: , Rfl:   PAST MEDICAL HISTORY: Past Medical History:  Diagnosis Date   Abnormality of gait 11/25/2014   Chronic daily headache 2003   Love   Depression    Memory disorder 02/29/2016   MS (multiple sclerosis) (HCC) 1992   Dr. 03/02/2016   Multiple sclerosis (HCC) 11/21/2013   Neurogenic bladder 11/21/2013    PAST SURGICAL HISTORY: Past Surgical History:  Procedure Laterality Date   ABLATION COLPOCLESIS  2004   CESAREAN SECTION     TUBAL LIGATION      FAMILY HISTORY: Family History  Problem Relation Age of Onset   Heart failure Father    Multiple sclerosis Sister    Cancer Neg Hx    Diabetes Neg Hx    Hyperlipidemia Neg Hx    Hypertension Neg Hx     Kidney disease Neg Hx    Stroke Neg Hx     SOCIAL HISTORY:  Social History   Socioeconomic History   Marital status: Divorced    Spouse name: Not on file   Number of children: 2   Years of education: college   Highest education level: Not on file  Occupational History   Occupation: disability  Tobacco Use   Smoking status: Never   Smokeless tobacco: Never  Vaping Use   Vaping Use: Never used  Substance and Sexual Activity   Alcohol use: Yes    Comment: social   Drug use: No   Sexual activity: Yes    Birth control/protection: Surgical  Other Topics Concern   Not on file  Social History Narrative   Patient is right handed.   Patient drinks 2 cups of coffee daily.   Social Determinants of Health   Financial Resource Strain: Not  on file  Food Insecurity: Not on file  Transportation Needs: Not on file  Physical Activity: Not on file  Stress: Not on file  Social Connections: Not on file  Intimate Partner Violence: Not on file     PHYSICAL EXAM  Vitals:   03/15/21 1554  BP: 121/79  Pulse: 70  Weight: 216 lb (98 kg)  Height: 5' 4.5" (1.638 m)    Body mass index is 36.5 kg/m.   General: The patient is well-developed and well-nourished and in no acute distress  HEENT:  Head is [place]/AT.  Sclera are anicteric.     Neck: No carotid bruits are noted.  The neck is nontender.  Cardiovascular: The heart has a regular rate and rhythm with a normal S1 and S2. There were no murmurs, gallops or rubs.    Skin: Extremities are without rash or  edema.  Musculoskeletal:  Back is nontender  Neurologic Exam  Mental status: The patient is alert and oriented x 3 at the time of the examination. The patient has apparent normal recent and remote memory, with an apparently normal attention span and concentration ability.   Speech is normal.  Cranial nerves: Extraocular movements are full.  Facial symmetry is present. There is good facial sensation to soft touch  bilaterally.Facial strength is normal.  Trapezius and sternocleidomastoid strength is normal. No dysarthria is noted.  The tongue is midline, and the patient has symmetric elevation of the soft palate. No obvious hearing deficits are noted.  Motor:  Muscle bulk is normal.   Tone is normal. Strength is  5 / 5 in all 4 extremities.   Sensory: Sensory testing is intact to pinprick, soft touch and vibration sensation in the arms.  She had reduced sensation to touch and vibration in the left leg.  Coordination: Cerebellar testing reveals good finger-nose-finger and heel-to-shin bilaterally.  Gait and station: Station is normal.   The gait is wide.  She can walk without her cane.  She has an unsteady tandem gait.  The Romberg is borderline.  Reflexes: Deep tendon reflexes are symmetric and normal bilaterally.        DIAGNOSTIC DATA (LABS, IMAGING, TESTING) - I reviewed patient records, labs, notes, testing and imaging myself where available.  Lab Results  Component Value Date   WBC 4.9 01/12/2021   HGB 12.1 01/12/2021   HCT 36.4 01/12/2021   MCV 92 01/12/2021   PLT 172 01/12/2021      Component Value Date/Time   NA 143 01/12/2021 1535   NA 142 04/05/2015 1435   K 4.3 01/12/2021 1535   K 3.9 04/05/2015 1435   CL 107 (H) 01/12/2021 1535   CO2 23 01/12/2021 1535   CO2 24 04/05/2015 1435   GLUCOSE 78 01/12/2021 1535   GLUCOSE 93 03/23/2016 1402   GLUCOSE 89 04/05/2015 1435   BUN 17 01/12/2021 1535   BUN 18.2 04/05/2015 1435   CREATININE 1.02 (H) 01/12/2021 1535   CREATININE 1.1 04/05/2015 1435   CALCIUM 8.9 01/12/2021 1535   CALCIUM 9.3 04/05/2015 1435   PROT 6.5 01/12/2021 1535   PROT 6.9 04/05/2015 1435   ALBUMIN 4.8 01/12/2021 1535   ALBUMIN 4.2 04/05/2015 1435   AST 14 01/12/2021 1535   AST 27 04/05/2015 1435   ALT 17 01/12/2021 1535   ALT 46 04/05/2015 1435   ALKPHOS 82 01/12/2021 1535   ALKPHOS 53 04/05/2015 1435   BILITOT <0.2 01/12/2021 1535   BILITOT 0.22  04/05/2015 1435   GFRNONAA 76  06/01/2020 1112   GFRAA 88 06/01/2020 1112   Lab Results  Component Value Date   CHOL 191 08/18/2011   HDL 66.10 08/18/2011   LDLCALC 103 (H) 08/18/2011   TRIG 112.0 08/18/2011   CHOLHDL 3 08/18/2011   No results found for: HGBA1C Lab Results  Component Value Date   VITAMINB12 470 04/05/2015   Lab Results  Component Value Date   TSH 1.80 08/18/2011       ASSESSMENT AND PLAN  Relapsing remitting multiple sclerosis (HCC)  Neurogenic bladder  Chronic tension-type headache, intractable  Abnormality of gait   Unclear if she is having a small exacerbation versus an intensification of existing symptoms.  Her MS has been stable on Tysabri so I favor a fluctuation.  However, due to the symptoms I will have her do 3 days of high-dose oral steroids (500 mg prednisone daily x3 days).  Additionally, I will send in a prescription for Robaxin to see if that can help some of her pain.  She is already on gabapentin and is advised to continue that. Stay active and exercise as tolerated. She will call us back on Monday if not better.  Consider a tricyclic if symptoms persist.  We will schedule follow-up in 3 to 4 months but she should call sooner if new or worsening symptoms.   Antonya Leeder A. Epimenio FootSater, MD, [facility],FAAN 03/15/2021, 5:58 PM Certified in Neurology, Clinical Neurophysiology, Sleep Medicine and Neuroimaging  [facility] [address], [address] [place], [place] [address] [phone]

## 2021-03-17 ENCOUNTER — Telehealth: Payer: Self-pay | Admitting: Neurology

## 2021-03-17 NOTE — Telephone Encounter (Signed)
Called the patient back to clarify because I didn't see anything in the notes about her being given a short.  She was referring to the steroids and Muscle relaxer that was called in for her. She was describing her symptoms and when I asked the patient if she had started and taking the steroids she advised that she had taken 1 tablet that night and another tablet the next morning.  Informed the patient that she is taking the medication incorrect. Advised that Dr Epimenio Foot is providing her with high dose oral steroids and the way he is prescribing the medication is to take 10 tablet day 1, again on day 2 and finish on day 3.  She thought that was a typo. I educated on why taking that many is used in a MS exacerbation and advised that she can space the 10 pills throughout the day.  Encouraged the patient to take the robaxin for the tightness sensation she describes.  Pt questioned about whether the forms were completed and I advised that according to what we had read the forms were not suppose to be completed by [place]. Pt states that she was only asking that we complete what medications she take and what they are for.

## 2021-03-17 NOTE — Telephone Encounter (Signed)
Pt called was in the office on yesterday, states Dr. Epimenio Foot gave her a steroid shot for the burning sensation in her legs. Pt wants to know how long does it take to kick in. She says the burning is unbearable and very uncomfortable. She also wants to know if the forms were filled out that she gave to the doctor also.

## 2021-03-17 NOTE — Telephone Encounter (Signed)
Gave completed form including pt meds back to medical records to process for pt.

## 2021-04-20 ENCOUNTER — Telehealth: Payer: Self-pay | Admitting: Neurology

## 2021-04-20 NOTE — Telephone Encounter (Signed)
Patient called in stating she is losing vision in both her eyes. Stated her eye doctor has sent a referral yesterday or the day before, but we have not received this referral. Attempted to call [facility] and they are closed the rest of the day for a meeting. I let her know I would send a message to Dr. Bonnita Hollow team letting them know what is going on as well, she was told she would need a new referral by someone for this, but her vision symptoms I assume are stemming from MS as previous visits with Dr. Epimenio Foot have mentioned blurred vision but not loss of vision. Patient would like a call back she is very worried.

## 2021-04-20 NOTE — Telephone Encounter (Signed)
Pt is coming in tomorrow to be assessed

## 2021-04-21 ENCOUNTER — Ambulatory Visit (INDEPENDENT_AMBULATORY_CARE_PROVIDER_SITE_OTHER): Payer: Medicare Other | Admitting: Neurology

## 2021-04-21 ENCOUNTER — Encounter: Payer: Self-pay | Admitting: Neurology

## 2021-04-21 VITALS — BP 122/80 | HR 83 | Ht 64.0 in | Wt 221.5 lb

## 2021-04-21 DIAGNOSIS — G35 Multiple sclerosis: Secondary | ICD-10-CM

## 2021-04-21 DIAGNOSIS — R269 Unspecified abnormalities of gait and mobility: Secondary | ICD-10-CM | POA: Diagnosis not present

## 2021-04-21 DIAGNOSIS — H539 Unspecified visual disturbance: Secondary | ICD-10-CM

## 2021-04-21 DIAGNOSIS — G44221 Chronic tension-type headache, intractable: Secondary | ICD-10-CM

## 2021-04-21 DIAGNOSIS — N319 Neuromuscular dysfunction of bladder, unspecified: Secondary | ICD-10-CM

## 2021-04-21 NOTE — Progress Notes (Signed)
[facility]  PATIENT: Connie Robertson DOB: [birth year]  REFERRING DOCTOR OR PCP: Dr. Anne Hahn SOURCE: Patient, notes from Dr. Anne Hahn, imaging and lab reports.  _________________________________   HISTORICAL  CHIEF COMPLAINT:  Chief Complaint  Patient presents with   Follow-up    Rm 1,alone. Here for vision concerns.     HISTORY OF PRESENT ILLNESS:  She is a 50 year old woman with relapsing remitting MS currently on Tysabri therapy  Update Updte 9.15.2022 At the last visit, she had a possible exacerbation vs fluctuation.    We did 3 days high dose oral prednisone and symptoms have improved back to baseline.    She remains on Tysabri.     Currently, balance is poor bu she can walk without a cane.  She sometimes takes a walked for exercise in the morning.   She also has mild leg weakness.   N fall since last visit.   While exercising on an elliptical (longer than usual), she had an episode of the vision changing in both eyes.   Vision came back and she started to exercise again but vision became darker again.    She improved again.     Another earlier time, she woke up with very reduced vision and it took a few hours for vision to get back to baseline.  At baseline, colors are desaturated OD relative to OS.    She also has a neurogenic bladder.  She gets sone UTIs but MS does not seem worse when that occurs.  She has some baseline pain in the legs that has been helped by gabapentin and Dilantin mildly.  She continues to experience migraines on a near daily basis..  These are about the same now as they were last month. Medications tried and failed: Anti-epileptics (topiramate, gabapent, dilantin, depakote, zonisamide), muscle relaxants, Botox, NSAIDs, tricyclics (nortriptyline), steroids, Aimovig, trigger point injections and   MS history: She was diagnosed with MS about 25 years.   She presented right facial weakness and twitching.   Initially she was felt to  have Bells Palsy.  Studies were consistent with MS.   She was initially on the interferons and glatiramer.  She saw Dr. Sandria Manly initially and then moved to the [facility].  She switched to Gilenya and tolerated it well.   However, she began to experience exacerbation (vision, numbness, balance).   She moved to [facility] and switched to Tysabri in 2017 due to the breakthrough activity she has tolerated Tysabri well..   She is JCV antibody negative last checked 01/24/2021.   Possible small exacerbation vs fluctuation 03/2021.    Migraine history: She has a long history of chronic migraine.  She currently is seeing Dr. Neale Burly.  Injections have not helped.  She did not get a benefit from Botox or Aimovig.  She is on Depakote and Topamax without much benefit.  When migraine occurs she notes visual blurring.   Imaging:  MRI of the brain 05/26/2020 shows multiple T2/FLAIR hyperintense foci in the periventricular, juxtacortical, and deep white matter of the hemispheres and in the pons and cerebellum.  There was no change compared to 05/27/2019.   MRI of the cervical spine 05/26/2020 showed multiple foci within the spinal cord.  There is a large T2 hyperintense focus posteriorly to the left adjacent to C3 and smaller foci laterally to the right adjacent to C3, laterally to the left adjacent to C3, laterally to the right adjacent to C4, laterally to the left adjacent to C6, laterally to the  left adjacent to C7, posteriorly adjacent to C7-T1 and laterally to the right adjacent to T1.  No change compared to the MRI 09/05/2018.  She also had mild degenerative changes at C5-C6 and C6-C7 but no nerve root compression or spinal stenosis.  REVIEW OF SYSTEMS: Constitutional: No fevers, chills, sweats, or change in appetite Eyes: No visual changes, double vision, eye pain Ear, nose and throat: No hearing loss, ear pain, nasal congestion, sore throat Cardiovascular: No chest pain, palpitations Respiratory:  No shortness of  breath at rest or with exertion.   No wheezes GastrointestinaI: No nausea, vomiting, diarrhea, abdominal pain, fecal incontinence Genitourinary:  No dysuria, urinary retention or frequency.  No nocturia. Musculoskeletal:  No neck pain, back pain Integumentary: No rash, pruritus, skin lesions Neurological: as above Psychiatric: No depression at this time.  No anxiety Endocrine: No palpitations, diaphoresis, change in appetite, change in weigh or increased thirst Hematologic/Lymphatic:  No anemia, purpura, petechiae. Allergic/Immunologic: No itchy/runny eyes, nasal congestion, recent allergic reactions, rashes  ALLERGIES: No Known Allergies  HOME MEDICATIONS:  Current Outpatient Medications:    folic acid (FOLVITE) 1 MG tablet, Take 1 mg by mouth daily., Disp: , Rfl:    gabapentin (NEURONTIN) 300 MG capsule, TAKE 1 CAPSULE(300 MG) BY MOUTH TWICE DAILY, Disp: 180 capsule, Rfl: 1   methocarbamol (ROBAXIN) 500 MG tablet, Take 1 tablet (500 mg total) by mouth 3 (three) times daily., Disp: 90 tablet, Rfl: 1   modafinil (PROVIGIL) 100 MG tablet, Take 1 tablet (100 mg total) by mouth daily., Disp: 30 tablet, Rfl: 3   MYRBETRIQ 50 MG TB24 tablet, TAKE 1 TABLET BY MOUTH EVERY DAY, Disp: 30 tablet, Rfl: 11   phentermine 37.5 MG capsule, Take 37.5 mg by mouth every morning., Disp: , Rfl:    phenytoin (DILANTIN) 100 MG ER capsule, Take 2 capsules at night, Disp: 180 capsule, Rfl: 0   predniSONE (DELTASONE) 50 MG tablet, 10 pills po qd x 3 days, Disp: 30 tablet, Rfl: 0   topiramate (TOPAMAX) 200 MG tablet, Take 100 mg by mouth daily., Disp: , Rfl:    TYSABRI 300 MG/15ML injection, INFUSE 300MG  IV PER  PRESCRIBING INFORMATION  EVERY 4 WEEKS AS DIRECTED  (DISCARD UNUSED), Disp: 15 mL, Rfl: 3   valACYclovir (VALTREX) 500 MG tablet, Take 1 tablet (500 mg total) by mouth daily., Disp: 30 tablet, Rfl: 6   Vitamin D, Ergocalciferol, (DRISDOL) 1.25 MG (50000 UNIT) CAPS capsule, Take 50,000 Units by mouth every  7 (seven) days., Disp: , Rfl:   PAST MEDICAL HISTORY: Past Medical History:  Diagnosis Date   Abnormality of gait 11/25/2014   Chronic daily headache 2003   Love   Depression    Memory disorder 02/29/2016   MS (multiple sclerosis) (HCC) 1992   Dr. 03/02/2016   Multiple sclerosis (HCC) 11/21/2013   Neurogenic bladder 11/21/2013    PAST SURGICAL HISTORY: Past Surgical History:  Procedure Laterality Date   ABLATION COLPOCLESIS  2004   CESAREAN SECTION     TUBAL LIGATION      FAMILY HISTORY: Family History  Problem Relation Age of Onset   Heart failure Father    Multiple sclerosis Sister    Cancer Neg Hx    Diabetes Neg Hx    Hyperlipidemia Neg Hx    Hypertension Neg Hx    Kidney disease Neg Hx    Stroke Neg Hx     SOCIAL HISTORY:  Social History   Socioeconomic History   Marital status: Divorced  Spouse name: Not on file   Number of children: 2   Years of education: college   Highest education level: Not on file  Occupational History   Occupation: disability  Tobacco Use   Smoking status: Never   Smokeless tobacco: Never  Vaping Use   Vaping Use: Never used  Substance and Sexual Activity   Alcohol use: Yes    Comment: social   Drug use: No   Sexual activity: Yes    Birth control/protection: Surgical  Other Topics Concern   Not on file  Social History Narrative   Patient is right handed.   Patient drinks 2 cups of coffee daily.   Social Determinants of Health   Financial Resource Strain: Not on file  Food Insecurity: Not on file  Transportation Needs: Not on file  Physical Activity: Not on file  Stress: Not on file  Social Connections: Not on file  Intimate Partner Violence: Not on file     PHYSICAL EXAM  Vitals:   04/21/21 0849  BP: 122/80  Pulse: 83  Weight: 221 lb 8 oz (100.5 kg)  Height: 5\' 4"  (1.626 m)    Body mass index is 38.02 kg/m.   General: The patient is well-developed and well-nourished and in no acute distress  HEENT:   Head is [place]/AT.  Sclera are anicteric.      Skin: Extremities are without rash or  edema.  Neurologic Exam  Mental status: The patient is alert and oriented x 3 at the time of the examination. The patient has apparent normal recent and remote memory, with an apparently normal attention span and concentration ability.   Speech is normal.  Cranial nerves: Extraocular movements are full.  Color vision is reduced on the relative to OS.  Visual acuity was more symmetric.  Facial symmetry is present. There is good facial sensation to soft touch bilaterally.Facial strength is normal.  Trapezius and sternocleidomastoid strength is normal. No dysarthria is noted.   No obvious hearing deficits are noted.  Motor:  Muscle bulk is normal.   Tone is normal. Strength is  5 / 5 in all 4 extremities.   Sensory: Sensory testing is intact to pinprick, soft touch and vibration sensation in the arms.  She had reduced sensation to touch and vibration in the left leg.  Coordination: Cerebellar testing reveals good finger-nose-finger and heel-to-shin bilaterally.  Gait and station: Station is normal.   The gait is wide.  She can walk without her cane.  She has an unsteady tandem gait.  The Romberg is borderline.  Reflexes: Deep tendon reflexes are symmetric and normal bilaterally.        DIAGNOSTIC DATA (LABS, IMAGING, TESTING) - I reviewed patient records, labs, notes, testing and imaging myself where available.  Lab Results  Component Value Date   WBC 4.9 01/12/2021   HGB 12.1 01/12/2021   HCT 36.4 01/12/2021   MCV 92 01/12/2021   PLT 172 01/12/2021      Component Value Date/Time   NA 143 01/12/2021 1535   NA 142 04/05/2015 1435   K 4.3 01/12/2021 1535   K 3.9 04/05/2015 1435   CL 107 (H) 01/12/2021 1535   CO2 23 01/12/2021 1535   CO2 24 04/05/2015 1435   GLUCOSE 78 01/12/2021 1535   GLUCOSE 93 03/23/2016 1402   GLUCOSE 89 04/05/2015 1435   BUN 17 01/12/2021 1535   BUN 18.2 04/05/2015 1435    CREATININE 1.02 (H) 01/12/2021 1535   CREATININE 1.1 04/05/2015 1435  CALCIUM 8.9 01/12/2021 1535   CALCIUM 9.3 04/05/2015 1435   PROT 6.5 01/12/2021 1535   PROT 6.9 04/05/2015 1435   ALBUMIN 4.8 01/12/2021 1535   ALBUMIN 4.2 04/05/2015 1435   AST 14 01/12/2021 1535   AST 27 04/05/2015 1435   ALT 17 01/12/2021 1535   ALT 46 04/05/2015 1435   ALKPHOS 82 01/12/2021 1535   ALKPHOS 53 04/05/2015 1435   BILITOT <0.2 01/12/2021 1535   BILITOT 0.22 04/05/2015 1435   GFRNONAA 76 06/01/2020 1112   GFRAA 88 06/01/2020 1112   Lab Results  Component Value Date   CHOL 191 08/18/2011   HDL 66.10 08/18/2011   LDLCALC 103 (H) 08/18/2011   TRIG 112.0 08/18/2011   CHOLHDL 3 08/18/2011   No results found for: HGBA1C Lab Results  Component Value Date   VITAMINB12 470 04/05/2015   Lab Results  Component Value Date   TSH 1.80 08/18/2011       ASSESSMENT AND PLAN  Relapsing remitting multiple sclerosis (HCC)  Abnormality of gait  Neurogenic bladder  Chronic tension-type headache, intractable  Visual changes   Fluctuations in vision are unlikely to be an exacerbation as symptoms improve again within minutes to hours.    Additionally, I will send in a prescription for Robaxin to see if that can help some of her pain.  She is already on gabapentin and is advised to continue that. Stay active and exercise as tolerated. For chronic migraine, Emgality 240 mg loading dose then 120 mg/q 4wk RTCin 6  months but she should call sooner if new or worsening symptoms.   Roran Wegner A. Epimenio Foot, MD, [facility] 04/21/2021, 10:22 AM Certified in Neurology, Clinical Neurophysiology, Sleep Medicine and Neuroimaging  [facility] [address] [place], [place] [address] [phone]

## 2021-05-02 ENCOUNTER — Telehealth: Payer: Self-pay | Admitting: Emergency Medicine

## 2021-05-02 NOTE — Telephone Encounter (Signed)
Received message from Lanny Hurst, RN in infusion services,    ".Connie Robertson 09-Jul-1971 missed her last infusion 9/21.Marland KitchenMarland KitchenMarland KitchenI called and left VM for her on the 9/21 and 9/22 and got no response.Marland KitchenMarland KitchenMarland KitchenI tried calling again today, but now her Vm box is full so can't leave another message....can u send her a MyChart message?"  Patient does not have MyChart.  Found latest DPR on file from 2019 with her mother, Connie Robertson as other contact.  Called that number, [phone] with no answer.  Kim updated and sent same information.

## 2021-05-23 ENCOUNTER — Telehealth: Payer: Self-pay | Admitting: Neurology

## 2021-05-23 NOTE — Telephone Encounter (Signed)
Pt called, would like a call from the nurse to discuss how to donate body to science.

## 2021-05-23 NOTE — Telephone Encounter (Signed)
LVM asking for pt to call office back. If she calls, please provide the following info on who she can contact: [facility] [employer] [facility] Phone: [phone] OR [employer] Robinette Haines, Body Donation Program Director, Secondary school teacher, [phone]

## 2021-06-01 ENCOUNTER — Telehealth: Payer: Self-pay | Admitting: Neurology

## 2021-06-01 DIAGNOSIS — R3915 Urgency of urination: Secondary | ICD-10-CM

## 2021-06-01 DIAGNOSIS — G35 Multiple sclerosis: Secondary | ICD-10-CM

## 2021-06-01 DIAGNOSIS — Z5181 Encounter for therapeutic drug level monitoring: Secondary | ICD-10-CM

## 2021-06-01 MED ORDER — PREDNISONE 50 MG PO TABS: ORAL_TABLET | ORAL | 0 refills | Status: DC

## 2021-06-01 MED ORDER — ALPRAZOLAM 0.5 MG PO TABS: ORAL_TABLET | ORAL | 0 refills | Status: DC

## 2021-06-01 NOTE — Addendum Note (Signed)
Addended by: York Spaniel on: 06/01/2021 11:55 AM   Modules accepted: Orders

## 2021-06-01 NOTE — Telephone Encounter (Signed)
[employer] medicare/medicaid order sent to [facility], [facility] they will reach out to the patient to schedule.  ?

## 2021-06-01 NOTE — Telephone Encounter (Signed)
I called the patient.  The patient had Tysabri infusion yesterday, several hours later she began having trouble with walking, she twisted her left ankle.  She is still having difficulty walking today but it may have improved slightly.  She reports no fevers or chills, she denies any signs of a urinary tract infection.  I will get her in for blood work and urinalysis.  It is time anyway to get MRI of the brain and cervical spine, I will order this.  I will get her on a course of oral prednisone.

## 2021-06-01 NOTE — Telephone Encounter (Signed)
Pt called states she had her infusion done on yesterday and last night she could barely walk. States this has never happened before. Pt requesting a call back.

## 2021-06-01 NOTE — Telephone Encounter (Signed)
Called the patient back.  Patient received her Tysabri infusion yesterday.  States that usually after her infusion she feels better but last night her legs began to hurt and she found that she had weakness in her lower extremities as well as a hard time walking.  This morning upon awakening she went to get up and could barely walk so much so she rolled her left ankle.  She has a difficult time with putting pressure on her legs so she grabbed her cane.  Patient states that she still feels weak in her lower extremities and is having a hard time with mobility.  Advised the patient Dr. Epimenio Foot is out on the office this week but that I would run this information by our work-in physician.  Patient requested to send this information to Dr. Anne Hahn since she was an established patient with him.  Advised I would send Dr. Anne Hahn this information and will get in touch with her with what he recommends. She was appreciative for the call back.

## 2021-07-14 ENCOUNTER — Ambulatory Visit: Payer: Medicare Other | Admitting: Neurology

## 2021-07-20 ENCOUNTER — Ambulatory Visit: Payer: Medicare Other | Admitting: Neurology

## 2021-07-20 ENCOUNTER — Other Ambulatory Visit: Payer: Self-pay

## 2021-07-20 DIAGNOSIS — G35 Multiple sclerosis: Secondary | ICD-10-CM

## 2021-07-20 DIAGNOSIS — N319 Neuromuscular dysfunction of bladder, unspecified: Secondary | ICD-10-CM

## 2021-07-20 MED ORDER — MIRABEGRON ER 50 MG PO TB24
50.0000 mg | ORAL_TABLET | Freq: Every day | ORAL | 2 refills | Status: DC
Start: 2021-07-20 — End: 2021-11-01

## 2021-07-27 ENCOUNTER — Other Ambulatory Visit (INDEPENDENT_AMBULATORY_CARE_PROVIDER_SITE_OTHER): Payer: Medicare Other

## 2021-07-27 DIAGNOSIS — Z0289 Encounter for other administrative examinations: Secondary | ICD-10-CM

## 2021-07-27 DIAGNOSIS — Z5181 Encounter for therapeutic drug level monitoring: Secondary | ICD-10-CM

## 2021-07-28 ENCOUNTER — Ambulatory Visit: Payer: Medicare Other | Admitting: Neurology

## 2021-07-28 LAB — CBC WITH DIFFERENTIAL/PLATELET
Basophils Absolute: 0 10*3/uL (ref 0.0–0.2)
Basos: 0 %
EOS (ABSOLUTE): 0.1 10*3/uL (ref 0.0–0.4)
Eos: 2 %
Hematocrit: 40.2 % (ref 34.0–46.6)
Hemoglobin: 13.2 g/dL (ref 11.1–15.9)
Immature Grans (Abs): 0 10*3/uL (ref 0.0–0.1)
Immature Granulocytes: 0 %
Lymphocytes Absolute: 2.6 10*3/uL (ref 0.7–3.1)
Lymphs: 53 %
MCH: 29.4 pg (ref 26.6–33.0)
MCHC: 32.8 g/dL (ref 31.5–35.7)
MCV: 90 fL (ref 79–97)
Monocytes Absolute: 0.3 10*3/uL (ref 0.1–0.9)
Monocytes: 7 %
NRBC: 1 % — ABNORMAL HIGH (ref 0–0)
Neutrophils Absolute: 1.8 10*3/uL (ref 1.4–7.0)
Neutrophils: 38 %
Platelets: 164 10*3/uL (ref 150–450)
RBC: 4.49 x10E6/uL (ref 3.77–5.28)
RDW: 13.7 % (ref 11.7–15.4)
WBC: 4.9 10*3/uL (ref 3.4–10.8)

## 2021-08-02 ENCOUNTER — Other Ambulatory Visit: Payer: Self-pay | Admitting: Neurology

## 2021-08-02 DIAGNOSIS — Z5181 Encounter for therapeutic drug level monitoring: Secondary | ICD-10-CM

## 2021-08-02 DIAGNOSIS — G35 Multiple sclerosis: Secondary | ICD-10-CM

## 2021-08-17 ENCOUNTER — Other Ambulatory Visit: Payer: Self-pay | Admitting: Internal Medicine

## 2021-08-17 ENCOUNTER — Ambulatory Visit: Payer: Medicare Other | Admitting: Registered"

## 2021-08-17 DIAGNOSIS — Z1231 Encounter for screening mammogram for malignant neoplasm of breast: Secondary | ICD-10-CM

## 2021-09-07 ENCOUNTER — Telehealth: Payer: Self-pay | Admitting: Neurology

## 2021-09-07 ENCOUNTER — Other Ambulatory Visit: Payer: Medicare Other

## 2021-09-07 NOTE — Telephone Encounter (Signed)
LVM for pt to call office

## 2021-09-07 NOTE — Telephone Encounter (Signed)
Pt is asking for a call from RN to discuss side effects she is having from [place]. Please advise.

## 2021-09-12 ENCOUNTER — Telehealth: Payer: Self-pay | Admitting: Neurology

## 2021-09-12 MED ORDER — CIPROFLOXACIN HCL 500 MG PO TABS: 500.0000 mg | ORAL_TABLET | Freq: Two times a day (BID) | ORAL | 0 refills | Status: DC

## 2021-09-12 NOTE — Telephone Encounter (Signed)
Pt feels she is having strong yeast infections as a result of The Tysabri, she'd like a call to discuss.

## 2021-09-12 NOTE — Telephone Encounter (Signed)
Called pt back. Relayed Dr. Bonnita Hollow recommendation. She is agreeable to plan. I e-scribed rx to [facility].

## 2021-09-12 NOTE — Telephone Encounter (Signed)
Called Connie Robertson.. After last Tysabri infusion on 09/07/21, she was reading on possible SE from infusion. She has been having frequent yeast infections and read Tysabri could be possible cause of this. She has about 3-4 yeast infections every other month per Connie Robertson. Given nitrofurantoin for treatment. Typical sx include: strong urine odor/itching/burning. She has OB in [place], [place] but this is too far for her. She is going to work on finding closer OB to establish w/.  She wants to know what Dr. Epimenio Foot would recommend. Aware I will discuss w/ MD and call back.

## 2021-09-19 ENCOUNTER — Telehealth: Payer: Self-pay | Admitting: Neurology

## 2021-09-19 LAB — CBC WITH DIFFERENTIAL/PLATELET
Basophils Absolute: 0 10*3/uL (ref 0.0–0.2)
Basos: 0 %
EOS (ABSOLUTE): 0.1 10*3/uL (ref 0.0–0.4)
Eos: 2 %
Hematocrit: 38.9 % (ref 34.0–46.6)
Hemoglobin: 12.5 g/dL (ref 11.1–15.9)
Immature Grans (Abs): 0 10*3/uL (ref 0.0–0.1)
Immature Granulocytes: 0 %
Lymphocytes Absolute: 2.2 10*3/uL (ref 0.7–3.1)
Lymphs: 36 %
MCH: 29.3 pg (ref 26.6–33.0)
MCHC: 32.1 g/dL (ref 31.5–35.7)
MCV: 91 fL (ref 79–97)
Monocytes Absolute: 0.7 10*3/uL (ref 0.1–0.9)
Monocytes: 11 %
NRBC: 1 % — ABNORMAL HIGH (ref 0–0)
Neutrophils Absolute: 3 10*3/uL (ref 1.4–7.0)
Neutrophils: 51 %
Platelets: 171 10*3/uL (ref 150–450)
RBC: 4.27 x10E6/uL (ref 3.77–5.28)
RDW: 14.1 % (ref 11.7–15.4)
WBC: 6 10*3/uL (ref 3.4–10.8)

## 2021-09-19 LAB — COMPREHENSIVE METABOLIC PANEL
ALT: 18 IU/L (ref 0–32)
AST: 22 IU/L (ref 0–40)
Albumin/Globulin Ratio: 2 (ref 1.2–2.2)
Albumin: 4.5 g/dL (ref 3.8–4.9)
Alkaline Phosphatase: 78 IU/L (ref 44–121)
BUN/Creatinine Ratio: 10 (ref 9–23)
BUN: 11 mg/dL (ref 6–24)
Bilirubin Total: <0.2 mg/dL (ref 0.0–1.2)
CO2: 22 mmol/L (ref 20–29)
Calcium: 9.6 mg/dL (ref 8.7–10.2)
Chloride: 104 mmol/L (ref 96–106)
Creatinine, Ser: 1.15 mg/dL — ABNORMAL HIGH (ref 0.57–1.00)
Globulin, Total: 2.3 g/dL (ref 1.5–4.5)
Glucose: 67 mg/dL — ABNORMAL LOW (ref 70–99)
Potassium: 3.6 mmol/L (ref 3.5–5.2)
Sodium: 143 mmol/L (ref 134–144)
Total Protein: 6.8 g/dL (ref 6.0–8.5)
eGFR: 57 mL/min/{1.73_m2} — ABNORMAL LOW (ref >59)

## 2021-09-19 LAB — STRATIFY JCV(TM) AB W/INDEX
JCV Antibody: NEGATIVE
JCV Index Value: 0.13

## 2021-09-19 NOTE — Telephone Encounter (Signed)
Pt completed labs on 09/07/2021 and her JCV was also drawn. The JCV came back negative with index value of 0.13. Will also send this information to Dr Epimenio Foot for review

## 2021-09-21 ENCOUNTER — Telehealth: Payer: Self-pay | Admitting: *Deleted

## 2021-09-21 NOTE — Telephone Encounter (Signed)
Tried calling pt to discuss results. Went to VM, VM full. Sent SMS notification w/ our office phone # for pt to call.

## 2021-09-21 NOTE — Telephone Encounter (Signed)
-----   Message from Asa Lente, MD sent at 09/20/2021  4:52 PM EST ----- Please let her know that the JCV antibody test looked good (was negative).  Her creatinine was mildly high.  It looks like this has been slightly elevated in the past as well.  We can send a copy of the CMP and CBC to her primary care doctor

## 2021-09-22 ENCOUNTER — Encounter: Payer: Self-pay | Admitting: *Deleted

## 2021-09-22 NOTE — Telephone Encounter (Signed)
Tried calling pt again, mailbox full. She does not have mychart set up. Will send letter since unreachable by phone.

## 2021-09-24 ENCOUNTER — Telehealth: Payer: Self-pay | Admitting: Neurology

## 2021-09-24 NOTE — Telephone Encounter (Signed)
JCV negative 0.13   2/13.2023

## 2021-11-01 ENCOUNTER — Encounter: Payer: Self-pay | Admitting: Neurology

## 2021-11-01 ENCOUNTER — Ambulatory Visit: Payer: Medicare Other | Admitting: Neurology

## 2021-11-01 ENCOUNTER — Other Ambulatory Visit: Payer: Self-pay

## 2021-11-01 DIAGNOSIS — G35 Multiple sclerosis: Secondary | ICD-10-CM

## 2021-11-01 DIAGNOSIS — N319 Neuromuscular dysfunction of bladder, unspecified: Secondary | ICD-10-CM

## 2021-11-01 MED ORDER — MIRABEGRON ER 50 MG PO TB24: 50.0000 mg | ORAL_TABLET | Freq: Every day | ORAL | 2 refills | Status: DC

## 2021-11-01 NOTE — Progress Notes (Deleted)
? ? ?PATIENT: Connie Robertson ?DOB: 1971-04-02 ? ?REASON FOR VISIT: Follow up for MS, migraines ?HISTORY FROM: Patient ?PRIMARY NEUROLOGIST: Dr. Epimenio Foot  ? ? ?ASSESSMENT AND PLAN ?51 y.o. year old female  ? ?1.  Relapsing remitting multiple sclerosis ?2.  Gait abnormality ?3.  Neurogenic bladder ?4.  Intractable chronic migraine ? ?-Continue Tysabri ?-JCV negative 0.13, 09/19/21 ? ? ?HISTORY OF PRESENT ILLNESS: ?Today 11/01/21 ? ?HISTORY  ?Update Updte 9.15.2022 ?At the last visit, she had a possible exacerbation vs fluctuation.    We did 3 days high dose oral prednisone and symptoms have improved back to baseline.    She remains on Tysabri.    ?  ?Currently, balance is poor bu she can walk without a cane.  She sometimes takes a walked for exercise in the morning.   She also has mild leg weakness.   N fall since last visit.   While exercising on an elliptical (longer than usual), she had an episode of the vision changing in both eyes.   Vision came back and she started to exercise again but vision became darker again.    She improved again.     Another earlier time, she woke up with very reduced vision and it took a few hours for vision to get back to baseline.  At baseline, colors are desaturated OD relative to OS.   ?  ?She also has a neurogenic bladder.  She gets sone UTIs but MS does not seem worse when that occurs.  She has some baseline pain in the legs that has been helped by gabapentin and Dilantin mildly. ?  ?She continues to experience migraines on a near daily basis..  These are about the same now as they were last month. ?Medications tried and failed: ?Anti-epileptics (topiramate, gabapent, dilantin, depakote, zonisamide), muscle relaxants, Botox, NSAIDs, tricyclics (nortriptyline), steroids, Aimovig, trigger point injections and  ?  ?MS history: ?She was diagnosed with MS about 25 years.   She presented right facial weakness and twitching.   Initially she was felt to have Bells Palsy.  Studies were  consistent with MS.   She was initially on the interferons and glatiramer.  She saw Dr. Sandria Manly initially and then moved to the [facility].  She switched to Gilenya and tolerated it well.   However, she began to experience exacerbation (vision, numbness, balance).   She moved to [facility] and switched to Tysabri in 2017 due to the breakthrough activity she has tolerated Tysabri well..   She is JCV antibody negative last checked 01/24/2021.   Possible small exacerbation vs fluctuation 03/2021.   ?  ?Migraine history: ?She has a long history of chronic migraine.  She currently is seeing Dr. Neale Burly.  Injections have not helped.  She did not get a benefit from Botox or Aimovig.  She is on Depakote and Topamax without much benefit.  When migraine occurs she notes visual blurring. ?  ?Imaging:  ?MRI of the brain 05/26/2020 shows multiple T2/FLAIR hyperintense foci in the periventricular, juxtacortical, and deep white matter of the hemispheres and in the pons and cerebellum.  There was no change compared to 05/27/2019.  ?  ?MRI of the cervical spine 05/26/2020 showed multiple foci within the spinal cord.  There is a large T2 hyperintense focus posteriorly to the left adjacent to C3 and smaller foci laterally to the right adjacent to C3, laterally to the left adjacent to C3, laterally to the right adjacent to C4, laterally to the left adjacent to C6, laterally  to the left adjacent to C7, posteriorly adjacent to C7-T1 and laterally to the right adjacent to T1.  No change compared to the MRI 09/05/2018.  She also had mild degenerative changes at C5-C6 and C6-C7 but no nerve root compression or spinal stenosis. ?  ?REVIEW OF SYSTEMS: Out of a complete 14 system review of symptoms, the patient complains only of the following symptoms, and all other reviewed systems are negative. ? ?See HPI ? ?ALLERGIES: ?No Known Allergies ? ?HOME MEDICATIONS: ?Outpatient Medications Prior to Visit  ?Medication Sig Dispense Refill  ? ALPRAZolam  (XANAX) 0.5 MG tablet Take 2 tablets approximately 45 minutes prior to the MRI study, take a third tablet if needed. 3 tablet 0  ? ciprofloxacin (CIPRO) 500 MG tablet Take 1 tablet (500 mg total) by mouth 2 (two) times daily. 14 tablet 0  ? folic acid (FOLVITE) 1 MG tablet Take 1 mg by mouth daily.    ? gabapentin (NEURONTIN) 300 MG capsule TAKE 1 CAPSULE(300 MG) BY MOUTH TWICE DAILY 180 capsule 1  ? methocarbamol (ROBAXIN) 500 MG tablet Take 1 tablet (500 mg total) by mouth 3 (three) times daily. 90 tablet 1  ? mirabegron ER (MYRBETRIQ) 50 MG TB24 tablet Take 1 tablet (50 mg total) by mouth daily. 30 tablet 2  ? modafinil (PROVIGIL) 100 MG tablet Take 1 tablet (100 mg total) by mouth daily. 30 tablet 3  ? phentermine 37.5 MG capsule Take 37.5 mg by mouth every morning.    ? phenytoin (DILANTIN) 100 MG ER capsule Take 2 capsules at night 180 capsule 0  ? predniSONE (DELTASONE) 50 MG tablet 10 pills po qd x 3 days 30 tablet 0  ? topiramate (TOPAMAX) 200 MG tablet Take 100 mg by mouth daily.    ? TYSABRI 300 MG/15ML injection INFUSE 300MG  IV PER  PRESCRIBING INFORMATION  EVERY 4 WEEKS AS DIRECTED  (DISCARD UNUSED) 15 mL 3  ? valACYclovir (VALTREX) 500 MG tablet Take 1 tablet (500 mg total) by mouth daily. 30 tablet 6  ? Vitamin D, Ergocalciferol, (DRISDOL) 1.25 MG (50000 UNIT) CAPS capsule Take 50,000 Units by mouth every 7 (seven) days.    ? ?No facility-administered medications prior to visit.  ? ? ?PAST MEDICAL HISTORY: ?Past Medical History:  ?Diagnosis Date  ? Abnormality of gait 11/25/2014  ? Chronic daily headache 2003  ? Love  ? Depression   ? Memory disorder 02/29/2016  ? MS (multiple sclerosis) (HCC) 1992  ? Dr. 03/02/2016  ? Multiple sclerosis (HCC) 11/21/2013  ? Neurogenic bladder 11/21/2013  ? ? ?PAST SURGICAL HISTORY: ?Past Surgical History:  ?Procedure Laterality Date  ? ABLATION COLPOCLESIS  2004  ? CESAREAN SECTION    ? TUBAL LIGATION    ? ? ?FAMILY HISTORY: ?Family History  ?Problem Relation Age of Onset  ?  Heart failure Father   ? Multiple sclerosis Sister   ? Cancer Neg Hx   ? Diabetes Neg Hx   ? Hyperlipidemia Neg Hx   ? Hypertension Neg Hx   ? Kidney disease Neg Hx   ? Stroke Neg Hx   ? ? ?SOCIAL HISTORY: ?Social History  ? ?Socioeconomic History  ? Marital status: Divorced  ?  Spouse name: Not on file  ? Number of children: 2  ? Years of education: college  ? Highest education level: Not on file  ?Occupational History  ? Occupation: disability  ?Tobacco Use  ? Smoking status: Never  ? Smokeless tobacco: Never  ?Vaping Use  ?  Vaping Use: Never used  ?Substance and Sexual Activity  ? Alcohol use: Yes  ?  Comment: social  ? Drug use: No  ? Sexual activity: Yes  ?  Birth control/protection: Surgical  ?Other Topics Concern  ? Not on file  ?Social History Narrative  ? Patient is right handed.  ? Patient drinks 2 cups of coffee daily.  ? ?Social Determinants of Health  ? ?Financial Resource Strain: Not on file  ?Food Insecurity: Not on file  ?Transportation Needs: Not on file  ?Physical Activity: Not on file  ?Stress: Not on file  ?Social Connections: Not on file  ?Intimate Partner Violence: Not on file  ? ? ?PHYSICAL EXAM ? ?There were no vitals filed for this visit. ?There is no height or weight on file to calculate BMI. ? ?Generalized: Well developed, in no acute distress  ?Neurological examination  ?Mentation: Alert oriented to time, place, history taking. Follows all commands speech and language fluent ?Cranial nerve II-XII: Pupils were equal round reactive to light. Extraocular movements were full, visual field were full on confrontational test. Facial sensation and strength were normal. Uvula tongue midline. Head turning and shoulder shrug  were normal and symmetric. ?Motor: The motor testing reveals 5 over 5 strength of all 4 extremities. Good symmetric motor tone is noted throughout.  ?Sensory: Sensory testing is intact to soft touch on all 4 extremities. No evidence of extinction is noted.  ?Coordination:  Cerebellar testing reveals good finger-nose-finger and heel-to-shin bilaterally.  ?Gait and station: Gait is normal. Tandem gait is normal. Romberg is negative. No drift is seen.  ?Reflexes: Deep tendon reflexes are s

## 2021-12-12 ENCOUNTER — Telehealth: Payer: Self-pay | Admitting: Neurology

## 2021-12-12 NOTE — Telephone Encounter (Signed)
I called pt back. Advised we do not have a specific recommendation. She can try contacting academic center like [facility] or [facility] to see if they have any options like this. She verbalized understanding. ?

## 2021-12-12 NOTE — Telephone Encounter (Signed)
Pt would like a call to know how to go about donating her body to science once she has passed, please call. ?

## 2022-02-22 ENCOUNTER — Other Ambulatory Visit: Payer: Self-pay

## 2022-02-22 DIAGNOSIS — G35 Multiple sclerosis: Secondary | ICD-10-CM

## 2022-02-22 DIAGNOSIS — N319 Neuromuscular dysfunction of bladder, unspecified: Secondary | ICD-10-CM

## 2022-02-22 MED ORDER — MIRABEGRON ER 50 MG PO TB24: 50.0000 mg | ORAL_TABLET | Freq: Every day | ORAL | 0 refills | Status: DC

## 2022-03-01 ENCOUNTER — Encounter: Payer: Self-pay | Admitting: *Deleted

## 2022-03-01 ENCOUNTER — Telehealth: Payer: Self-pay | Admitting: *Deleted

## 2022-03-01 ENCOUNTER — Other Ambulatory Visit: Payer: Self-pay | Admitting: *Deleted

## 2022-03-01 ENCOUNTER — Telehealth: Payer: Self-pay | Admitting: Neurology

## 2022-03-01 ENCOUNTER — Other Ambulatory Visit (INDEPENDENT_AMBULATORY_CARE_PROVIDER_SITE_OTHER): Payer: Self-pay

## 2022-03-01 DIAGNOSIS — Z0289 Encounter for other administrative examinations: Secondary | ICD-10-CM

## 2022-03-01 DIAGNOSIS — Z79899 Other long term (current) drug therapy: Secondary | ICD-10-CM

## 2022-03-01 DIAGNOSIS — G35 Multiple sclerosis: Secondary | ICD-10-CM

## 2022-03-01 NOTE — Telephone Encounter (Signed)
Placed JCV lab in [facility] lock box for routine lab pick up. Results pending. 

## 2022-03-01 NOTE — Telephone Encounter (Signed)
Tried calling pt to schedule appt. She is past due for f/u. Last seen 04/2021. She had Tysabri infusion/labs today in office.  Called [phone]. VM not set up to leave message. Sent letter to pt.  Called [phone]. Phone continued to ring, unable to LVM.

## 2022-03-02 LAB — CBC WITH DIFFERENTIAL/PLATELET
Basophils Absolute: 0 10*3/uL (ref 0.0–0.2)
Basos: 0 %
EOS (ABSOLUTE): 0.1 10*3/uL (ref 0.0–0.4)
Eos: 2 %
Hematocrit: 36.5 % (ref 34.0–46.6)
Hemoglobin: 12.3 g/dL (ref 11.1–15.9)
Immature Grans (Abs): 0 10*3/uL (ref 0.0–0.1)
Immature Granulocytes: 0 %
Lymphocytes Absolute: 2.7 10*3/uL (ref 0.7–3.1)
Lymphs: 49 %
MCH: 30.2 pg (ref 26.6–33.0)
MCHC: 33.7 g/dL (ref 31.5–35.7)
MCV: 90 fL (ref 79–97)
Monocytes Absolute: 0.4 10*3/uL (ref 0.1–0.9)
Monocytes: 8 %
Neutrophils Absolute: 2.2 10*3/uL (ref 1.4–7.0)
Neutrophils: 41 %
Platelets: 184 10*3/uL (ref 150–450)
RBC: 4.07 x10E6/uL (ref 3.77–5.28)
RDW: 13.9 % (ref 11.7–15.4)
WBC: 5.4 10*3/uL (ref 3.4–10.8)

## 2022-03-06 ENCOUNTER — Telehealth: Payer: Self-pay | Admitting: Neurology

## 2022-03-06 NOTE — Telephone Encounter (Signed)
Pt is calling and is requesting a call back from the nurse. Pt said her legs feel really heavy and nothings is helping Pt legs.

## 2022-03-06 NOTE — Telephone Encounter (Signed)
Called pt. Had Tysabri infusion 03/01/22. Legs have felt heavy since. Normally does not have any issues post infusion. Tolerated infusion ok.  Has not done any more increased activity than normal. No infection/illness currently.  She is past due for appt as well. Scheduled f/u for 03/09/22 at 1:30pm with Dr. Epimenio Foot. Asked that she check in at 1:00pm. She verbalized understanding.

## 2022-03-09 ENCOUNTER — Encounter: Payer: Self-pay | Admitting: Neurology

## 2022-03-09 ENCOUNTER — Telehealth: Payer: Self-pay | Admitting: Neurology

## 2022-03-09 ENCOUNTER — Ambulatory Visit (INDEPENDENT_AMBULATORY_CARE_PROVIDER_SITE_OTHER): Payer: Medicare Other | Admitting: Neurology

## 2022-03-09 VITALS — BP 102/74 | HR 64 | Ht 64.0 in | Wt 222.6 lb

## 2022-03-09 DIAGNOSIS — N319 Neuromuscular dysfunction of bladder, unspecified: Secondary | ICD-10-CM

## 2022-03-09 DIAGNOSIS — R269 Unspecified abnormalities of gait and mobility: Secondary | ICD-10-CM

## 2022-03-09 DIAGNOSIS — G35 Multiple sclerosis: Secondary | ICD-10-CM | POA: Diagnosis not present

## 2022-03-09 DIAGNOSIS — H539 Unspecified visual disturbance: Secondary | ICD-10-CM

## 2022-03-09 DIAGNOSIS — Z79899 Other long term (current) drug therapy: Secondary | ICD-10-CM | POA: Diagnosis not present

## 2022-03-09 MED ORDER — QULIPTA 60 MG PO TABS: ORAL_TABLET | ORAL | 11 refills | Status: DC

## 2022-03-09 NOTE — Progress Notes (Addendum)
[facility]  PATIENT: Connie Robertson DOB: [birth year]  REFERRING DOCTOR OR PCP: Dr. Anne Hahn SOURCE: Patient, notes from Dr. Anne Hahn, imaging and lab reports.  _________________________________   HISTORICAL  CHIEF COMPLAINT:  Chief Complaint  Patient presents with   Follow-up    Rm 16, alone. Here to f/u for MS, on Tysabri and tolerating well. Last seen 04/21/21. Last infusion date: 03/01/2022 and Next infusion date: 03/29/2022. Pt reports feeling the same even after her infusion. Usually pt reports feeling better. Per pt her legs hurt and walking is painful. Continues to have migraines.     HISTORY OF PRESENT ILLNESS:  She is a 51 year old woman with relapsing remitting MS currently on Tysabri therapy  Update Updte 03/09/2022: She remains on Tysabri.   She tolerates it well in general.    She felt she did not tolerate the last infusion as well - her legs have felt like lead since and are   Currently, balance is poor but she can walk without a cane.  She has not had any falls.  Leg strength is fairly symmetric.  She sometimes has some numbness in the legs.  She has dysesthesias that are helped by gabapentin and Dilantin a little bit  She also has a neurogenic bladder.  She gets sone UTIs but MS does not seem worse when that occurs.    She reports reduced vision, unchanged compared to the last visit.  She continues to have a lot of headache, chronic migraine with 30/30 days.  She is on Emgality but it has not helped much.   Other med's and Botox have not helped.   She continues to experience migraines on a near daily basis..  These are about the same now as they were last month.     Medications tried and failed for migraine: Anti-epileptics (topiramate, gabapent, dilantin, depakote, zonisamide), muscle relaxants, Botox, NSAIDs, tricyclics (nortriptyline), steroids, Aimovig, trigger point injections and   MS history: She was diagnosed with MS about 25 years.   She  presented right facial weakness and twitching.   Initially she was felt to have Bells Palsy.  Studies were consistent with MS.   She was initially on the interferons and glatiramer.  She saw Dr. Sandria Manly initially and then moved to the [facility].  She switched to Gilenya and tolerated it well.   However, she began to experience exacerbation (vision, numbness, balance).   She moved to [facility] and switched to Tysabri in 2017 due to the breakthrough activity she has tolerated Tysabri well..   She is JCV antibody negative last checked 01/24/2021.   Possible small exacerbation vs fluctuation 03/2021.     Fluctuation vs. Small relapse with legs heavy 03/2022.      Migraine history: She has a long history of chronic migraine.  She currently is seeing Dr. Neale Burly.  Injections have not helped.  She did not get a benefit from Botox or Aimovig.  She is on Depakote and Topamax without much benefit.  When migraine occurs she notes visual blurring.   Imaging:  MRI of the brain 05/26/2020 shows multiple T2/FLAIR hyperintense foci in the periventricular, juxtacortical, and deep white matter of the hemispheres and in the pons and cerebellum.  There was no change compared to 05/27/2019.   MRI of the cervical spine 05/26/2020 showed multiple foci within the spinal cord.  There is a large T2 hyperintense focus posteriorly to the left adjacent to C3 and smaller foci laterally to the right adjacent to C3, laterally to  the left adjacent to C3, laterally to the right adjacent to C4, laterally to the left adjacent to C6, laterally to the left adjacent to C7, posteriorly adjacent to C7-T1 and laterally to the right adjacent to T1.  No change compared to the MRI 09/05/2018.  She also had mild degenerative changes at C5-C6 and C6-C7 but no nerve root compression or spinal stenosis.  REVIEW OF SYSTEMS: Constitutional: No fevers, chills, sweats, or change in appetite Eyes: No visual changes, double vision, eye pain Ear, nose and throat:  No hearing loss, ear pain, nasal congestion, sore throat Cardiovascular: No chest pain, palpitations Respiratory:  No shortness of breath at rest or with exertion.   No wheezes GastrointestinaI: No nausea, vomiting, diarrhea, abdominal pain, fecal incontinence Genitourinary:  No dysuria, urinary retention or frequency.  No nocturia. Musculoskeletal:  No neck pain, back pain Integumentary: No rash, pruritus, skin lesions Neurological: as above Psychiatric: No depression at this time.  No anxiety Endocrine: No palpitations, diaphoresis, change in appetite, change in weigh or increased thirst Hematologic/Lymphatic:  No anemia, purpura, petechiae. Allergic/Immunologic: No itchy/runny eyes, nasal congestion, recent allergic reactions, rashes  ALLERGIES: No Known Allergies  HOME MEDICATIONS:  Current Outpatient Medications:    ALPRAZolam (XANAX) 0.5 MG tablet, Take 2 tablets approximately 45 minutes prior to the MRI study, take a third tablet if needed., Disp: 3 tablet, Rfl: 0   Atogepant (QULIPTA) 60 MG TABS, One po qd, Disp: 30 tablet, Rfl: 11   ciprofloxacin (CIPRO) 500 MG tablet, Take 1 tablet (500 mg total) by mouth 2 (two) times daily., Disp: 14 tablet, Rfl: 0   folic acid (FOLVITE) 1 MG tablet, Take 1 mg by mouth daily., Disp: , Rfl:    gabapentin (NEURONTIN) 300 MG capsule, TAKE 1 CAPSULE(300 MG) BY MOUTH TWICE DAILY, Disp: 180 capsule, Rfl: 1   methocarbamol (ROBAXIN) 500 MG tablet, Take 1 tablet (500 mg total) by mouth 3 (three) times daily., Disp: 90 tablet, Rfl: 1   mirabegron ER (MYRBETRIQ) 50 MG TB24 tablet, Take 1 tablet (50 mg total) by mouth daily. Please call and make overdue appt for further refills. 1st attempt, Disp: 30 tablet, Rfl: 0   modafinil (PROVIGIL) 100 MG tablet, Take 1 tablet (100 mg total) by mouth daily., Disp: 30 tablet, Rfl: 3   phentermine 37.5 MG capsule, Take 37.5 mg by mouth every morning., Disp: , Rfl:    phenytoin (DILANTIN) 100 MG ER capsule, Take 2  capsules at night, Disp: 180 capsule, Rfl: 0   predniSONE (DELTASONE) 50 MG tablet, 10 pills po qd x 3 days, Disp: 30 tablet, Rfl: 0   topiramate (TOPAMAX) 200 MG tablet, Take 100 mg by mouth daily., Disp: , Rfl:    TYSABRI 300 MG/15ML injection, INFUSE 300MG  IV PER  PRESCRIBING INFORMATION  EVERY 4 WEEKS AS DIRECTED  (DISCARD UNUSED), Disp: 15 mL, Rfl: 3   valACYclovir (VALTREX) 500 MG tablet, Take 1 tablet (500 mg total) by mouth daily., Disp: 30 tablet, Rfl: 6   Vitamin D, Ergocalciferol, (DRISDOL) 1.25 MG (50000 UNIT) CAPS capsule, Take 50,000 Units by mouth every 7 (seven) days., Disp: , Rfl:   PAST MEDICAL HISTORY: Past Medical History:  Diagnosis Date   Abnormality of gait 11/25/2014   Chronic daily headache 2003   Love   Depression    Memory disorder 02/29/2016   MS (multiple sclerosis) (HCC) 1992   Dr. 03/02/2016   Multiple sclerosis (HCC) 11/21/2013   Neurogenic bladder 11/21/2013    PAST SURGICAL HISTORY: Past  Surgical History:  Procedure Laterality Date   ABLATION COLPOCLESIS  2004   CESAREAN SECTION     TUBAL LIGATION      FAMILY HISTORY: Family History  Problem Relation Age of Onset   Heart failure Father    Multiple sclerosis Sister    Cancer Neg Hx    Diabetes Neg Hx    Hyperlipidemia Neg Hx    Hypertension Neg Hx    Kidney disease Neg Hx    Stroke Neg Hx     SOCIAL HISTORY:  Social History   Socioeconomic History   Marital status: Divorced    Spouse name: Not on file   Number of children: 2   Years of education: college   Highest education level: Not on file  Occupational History   Occupation: disability  Tobacco Use   Smoking status: Never   Smokeless tobacco: Never  Vaping Use   Vaping Use: Never used  Substance and Sexual Activity   Alcohol use: Yes    Comment: social   Drug use: No   Sexual activity: Yes    Birth control/protection: Surgical  Other Topics Concern   Not on file  Social History Narrative   Patient is right handed.    Patient drinks 2 cups of coffee daily.   Social Determinants of Health   Financial Resource Strain: Not on file  Food Insecurity: Not on file  Transportation Needs: Not on file  Physical Activity: Not on file  Stress: Not on file  Social Connections: Not on file  Intimate Partner Violence: Not on file     PHYSICAL EXAM  Vitals:   03/09/22 1313  BP: 102/74  Pulse: 64  Weight: 222 lb 9.6 oz (101 kg)  Height: 5\' 4"  (1.626 m)    Body mass index is 38.21 kg/m.   General: The patient is well-developed and well-nourished and in no acute distress  HEENT:  Head is [place]/AT.  Sclera are anicteric.      Skin: Extremities are without rash or  edema.  Neurologic Exam  Mental status: The patient is alert and oriented x 3 at the time of the examination. The patient has apparent normal recent and remote memory, with an apparently normal attention span and concentration ability.   Speech is normal.  Cranial nerves: Extraocular movements are full.  Color vision is reduced on the right  Visual acuity was more symmetric.  Facial symmetry is present. There is good facial sensation to soft touch bilaterally.Facial strength is normal.  Trapezius and sternocleidomastoid strength is normal. No dysarthria is noted.   No obvious hearing deficits are noted.  Motor:  Muscle bulk is normal.   Tone is normal. Strength is  5 / 5 in all 4 extremities.   Sensory: Sensory testing is intact to pinprick, soft touch and vibration sensation in the arms.  She had reduced sensation to touch and vibration in the left leg.  Coordination: Cerebellar testing reveals good finger-nose-finger and heel-to-shin bilaterally.  Gait and station: Station is normal.   The gait is wide.  She is able to walk to the front of the office without a cane but is unsteady.  She is unable to do a tandem gait and the Romberg is borderline.   Reflexes: Deep tendon reflexes are symmetric and normal bilaterally.        DIAGNOSTIC DATA  (LABS, IMAGING, TESTING) - I reviewed patient records, labs, notes, testing and imaging myself where available.  Lab Results  Component Value Date  WBC 5.4 03/01/2022   HGB 12.3 03/01/2022   HCT 36.5 03/01/2022   MCV 90 03/01/2022   PLT 184 03/01/2022      Component Value Date/Time   NA 143 09/07/2021 1220   NA 142 04/05/2015 1435   K 3.6 09/07/2021 1220   K 3.9 04/05/2015 1435   CL 104 09/07/2021 1220   CO2 22 09/07/2021 1220   CO2 24 04/05/2015 1435   GLUCOSE 67 (L) 09/07/2021 1220   GLUCOSE 93 03/23/2016 1402   GLUCOSE 89 04/05/2015 1435   BUN 11 09/07/2021 1220   BUN 18.2 04/05/2015 1435   CREATININE 1.15 (H) 09/07/2021 1220   CREATININE 1.1 04/05/2015 1435   CALCIUM 9.6 09/07/2021 1220   CALCIUM 9.3 04/05/2015 1435   PROT 6.8 09/07/2021 1220   PROT 6.9 04/05/2015 1435   ALBUMIN 4.5 09/07/2021 1220   ALBUMIN 4.2 04/05/2015 1435   AST 22 09/07/2021 1220   AST 27 04/05/2015 1435   ALT 18 09/07/2021 1220   ALT 46 04/05/2015 1435   ALKPHOS 78 09/07/2021 1220   ALKPHOS 53 04/05/2015 1435   BILITOT <0.2 09/07/2021 1220   BILITOT 0.22 04/05/2015 1435   GFRNONAA 76 06/01/2020 1112   GFRAA 88 06/01/2020 1112   Lab Results  Component Value Date   CHOL 191 08/18/2011   HDL 66.10 08/18/2011   LDLCALC 103 (H) 08/18/2011   TRIG 112.0 08/18/2011   CHOLHDL 3 08/18/2011   No results found for: "HGBA1C" Lab Results  Component Value Date   VITAMINB12 470 04/05/2015   Lab Results  Component Value Date   TSH 1.80 08/18/2011       ASSESSMENT AND PLAN  Relapsing remitting multiple sclerosis (HCC) - Plan: MR BRAIN W WO CONTRAST, MR CERVICAL SPINE W WO CONTRAST, CANCELED: CBC with Differential/Platelet, CANCELED: Stratify JCV Antibody Test ([facility])  High risk medication use - Plan: CANCELED: CBC with Differential/Platelet, CANCELED: Stratify JCV Antibody Test ([facility])  Neurogenic bladder - Plan: MR CERVICAL SPINE W WO CONTRAST  Abnormality of gait - Plan: MR BRAIN W  WO CONTRAST, MR CERVICAL SPINE W WO CONTRAST  Visual changes   She has new symptoms with heaviness in the legs.  Unclear if small exacerbation vs fluctuation.   Will hold off on steroid as symptoms have been present for over a week.  She will continue Tysabri.  JCV is pending. Stay active and exercise as tolerated. For chronic migraine, we will switch the Emgality to [place] and see if she does better.  Modafinil had not helped and she has stopped. RTC in 6  months but she should call sooner if new or worsening symptoms.   43 minutes office visit with the majority of the time spent face-to-face for history and physical, discussion/counseling and decision-making.  Additional time with record review and documentation.    Cadden Elizondo A. Epimenio Foot, MD, [facility] 03/09/2022, 6:17 PM Certified in Neurology, Clinical Neurophysiology, Sleep Medicine and Neuroimaging  [facility] [address], [address] [place], [place] [address] [phone]

## 2022-03-09 NOTE — Telephone Encounter (Signed)
[employer] medicare/[place] medicaid NPR sent to [facility] 

## 2022-03-09 NOTE — Telephone Encounter (Signed)
Called [facility] at [phone]. Account # [id number]. Spoke w/ Jasmine. Results still pending.

## 2022-03-10 NOTE — Telephone Encounter (Signed)
JCV ab drawn on 03/01/22 negative, index: 0.15.

## 2022-03-23 ENCOUNTER — Other Ambulatory Visit: Payer: Self-pay

## 2022-03-23 DIAGNOSIS — G35 Multiple sclerosis: Secondary | ICD-10-CM

## 2022-03-23 DIAGNOSIS — N319 Neuromuscular dysfunction of bladder, unspecified: Secondary | ICD-10-CM

## 2022-03-23 MED ORDER — MIRABEGRON ER 50 MG PO TB24: 50.0000 mg | ORAL_TABLET | Freq: Every day | ORAL | 3 refills | Status: DC

## 2022-03-27 ENCOUNTER — Telehealth: Payer: Self-pay | Admitting: Neurology

## 2022-03-27 NOTE — Telephone Encounter (Signed)
Pt is asking for a call from RN to discuss a letter she received from [employer] re: Tysabri, please call.

## 2022-03-27 NOTE — Telephone Encounter (Signed)
I called patient.  She states that she received a letter from [facility] asking her to schedule an appointment.  Patient was just seen on August 3 with Dr. Epimenio Foot.  She has not heard about her MRIs.  I advised her to call [facility] and gave her their phone number.  She reports that she was given gabapentin 300 mg to help with muscle relaxation during her MRI.  She reports that she had involuntary muscle spasms during her last MRI.  She is wondering if Dr. Epimenio Foot would order that for her again.

## 2022-03-28 ENCOUNTER — Telehealth: Payer: Self-pay | Admitting: Neurology

## 2022-03-28 NOTE — Telephone Encounter (Signed)
Pt had previously taken xanax prior to MRI procedure before. She has a driver. Dr. Epimenio Foot will send the prescription in for that

## 2022-03-28 NOTE — Addendum Note (Signed)
Addended by: Judi Cong on: 03/28/2022 05:01 PM   Modules accepted: Orders

## 2022-03-28 NOTE — Telephone Encounter (Signed)
Pt was called back, she stated she just wanted to make sure RN is aware of the date and time of her MRI. Pt also wanted to ensure the request has been sent that she would like something called in to calm her for the MRI.  Per the request of Baird Lyons, RN pt was advised that since there will be something called in for preparation of the MRI she will need a driver to and from the MRI, pt stated she will have someone bring her to and from.  This is FYI, no call back requested.

## 2022-03-28 NOTE — Telephone Encounter (Signed)
Pt is calling to make nurse aware of her MRI appointment time. Pt is requesting a nurse give her a call

## 2022-04-06 ENCOUNTER — Other Ambulatory Visit: Payer: Self-pay | Admitting: Neurology

## 2022-04-07 ENCOUNTER — Telehealth: Payer: Self-pay | Admitting: Neurology

## 2022-04-07 NOTE — Telephone Encounter (Signed)
Pt has called in again stating she has not received medication for MRI. She can not complete it if she does not have meds. As on call MD, can you assist pt so she can complete MRI? Thank you

## 2022-04-07 NOTE — Telephone Encounter (Signed)
Pt is needing gabapentin (NEURONTIN) 300 MG capsule called in to the [facility] on [address] before her MRI tomorrow.

## 2022-04-08 ENCOUNTER — Ambulatory Visit
Admission: RE | Admit: 2022-04-08 | Discharge: 2022-04-08 | Disposition: A | Discharge status: Home / Self Care | Payer: Medicare Other | Source: Ambulatory Visit | Attending: Neurology | Admitting: Neurology

## 2022-04-08 DIAGNOSIS — R269 Unspecified abnormalities of gait and mobility: Secondary | ICD-10-CM

## 2022-04-08 DIAGNOSIS — N319 Neuromuscular dysfunction of bladder, unspecified: Secondary | ICD-10-CM

## 2022-04-08 DIAGNOSIS — G35 Multiple sclerosis: Secondary | ICD-10-CM

## 2022-04-08 MED ORDER — GADOPICLENOL 0.5 MMOL/ML IV SOLN
7.5000 mL | Freq: Once | INTRAVENOUS | Status: AC | PRN
  Administered 2022-04-08: 7.5 mL via INTRAVENOUS

## 2022-04-11 ENCOUNTER — Telehealth: Payer: Self-pay

## 2022-04-11 NOTE — Telephone Encounter (Signed)
I called patient to discuss. No answer, left a message asking her to call us back. If patient calls back another day please route to POD 1. 

## 2022-04-11 NOTE — Telephone Encounter (Signed)
Looks like pt completed MRI 04/08/22

## 2022-04-11 NOTE — Telephone Encounter (Signed)
-----   Message from Asa Lente, MD sent at 04/10/2022 10:49 AM EDT ----- Please let her know that the MRI of the brain and cervical spine look okay.  They show the old MS lesions but there were no new ones.

## 2022-04-13 ENCOUNTER — Encounter: Payer: Self-pay | Admitting: *Deleted

## 2022-04-13 NOTE — Addendum Note (Signed)
Addended by: Arther Abbott on: 04/13/2022 09:37 AM   Modules accepted: Orders

## 2022-04-13 NOTE — Telephone Encounter (Addendum)
Tried calling pt at [phone]. Rang once and went to VM. LVM for pt to call.   Pt called back and spoke w/ Jael in phone room. Results relayed by Jael per my request to pt:"Please let her know that the MRI of the brain and cervical spine look okay.  They show the old MS lesions but there were no new ones."  Pt had further questions. I picked up call and spoke w/ pt. Having leg pain since last Tysabri infusion 03/29/22. Has leg pain usually for a couple days and goes away but has been worse and constant since last infusion. Walking in painful. Has not taken methocarbamol 500mg , does not have. Does not have any gabapentin 300mg  either.  No current illness/infection. I updated medication list with her. She forgot that she has tizanidine 4mg  po BID prn. She will try to take this the next few days to see if this helps. She will call back if ineffective.

## 2022-07-02 ENCOUNTER — Other Ambulatory Visit: Payer: Self-pay | Admitting: Neurology

## 2022-07-06 ENCOUNTER — Other Ambulatory Visit: Payer: Self-pay | Admitting: Neurology

## 2022-08-08 ENCOUNTER — Other Ambulatory Visit (INDEPENDENT_AMBULATORY_CARE_PROVIDER_SITE_OTHER): Payer: Self-pay

## 2022-08-08 ENCOUNTER — Other Ambulatory Visit: Payer: Self-pay | Admitting: *Deleted

## 2022-08-08 ENCOUNTER — Telehealth: Payer: Self-pay | Admitting: *Deleted

## 2022-08-08 DIAGNOSIS — Z79899 Other long term (current) drug therapy: Secondary | ICD-10-CM

## 2022-08-08 DIAGNOSIS — G35 Multiple sclerosis: Secondary | ICD-10-CM

## 2022-08-08 DIAGNOSIS — Z0289 Encounter for other administrative examinations: Secondary | ICD-10-CM

## 2022-08-08 NOTE — Telephone Encounter (Signed)
Placed JCV lab in [facility] lock box for routine lab pick up. Results pending. 

## 2022-08-09 LAB — CBC WITH DIFFERENTIAL/PLATELET
Basophils Absolute: 0 10*3/uL (ref 0.0–0.2)
Basos: 0 %
EOS (ABSOLUTE): 0.1 10*3/uL (ref 0.0–0.4)
Eos: 2 %
Hematocrit: 40.9 % (ref 34.0–46.6)
Hemoglobin: 13.4 g/dL (ref 11.1–15.9)
Immature Grans (Abs): 0 10*3/uL (ref 0.0–0.1)
Immature Granulocytes: 0 %
Lymphocytes Absolute: 2.4 10*3/uL (ref 0.7–3.1)
Lymphs: 51 %
MCH: 29.1 pg (ref 26.6–33.0)
MCHC: 32.8 g/dL (ref 31.5–35.7)
MCV: 89 fL (ref 79–97)
Monocytes Absolute: 0.3 10*3/uL (ref 0.1–0.9)
Monocytes: 7 %
NRBC: 1 % — ABNORMAL HIGH (ref 0–0)
Neutrophils Absolute: 2 10*3/uL (ref 1.4–7.0)
Neutrophils: 40 %
Platelets: 178 10*3/uL (ref 150–450)
RBC: 4.6 x10E6/uL (ref 3.77–5.28)
RDW: 13.1 % (ref 11.7–15.4)
WBC: 4.8 10*3/uL (ref 3.4–10.8)

## 2022-08-16 NOTE — Telephone Encounter (Signed)
Received results of JCV  Index Value 0.17 JCV Antibody: Negative

## 2022-08-25 ENCOUNTER — Encounter: Payer: Self-pay | Admitting: Neurology

## 2022-09-11 ENCOUNTER — Encounter: Payer: Self-pay | Admitting: Neurology

## 2022-09-11 ENCOUNTER — Ambulatory Visit (INDEPENDENT_AMBULATORY_CARE_PROVIDER_SITE_OTHER): Payer: 59 | Admitting: Neurology

## 2022-09-11 ENCOUNTER — Telehealth: Payer: Self-pay | Admitting: Neurology

## 2022-09-11 VITALS — BP 111/71 | HR 69 | Ht 64.0 in | Wt 212.0 lb

## 2022-09-11 DIAGNOSIS — N319 Neuromuscular dysfunction of bladder, unspecified: Secondary | ICD-10-CM

## 2022-09-11 DIAGNOSIS — H539 Unspecified visual disturbance: Secondary | ICD-10-CM

## 2022-09-11 DIAGNOSIS — R269 Unspecified abnormalities of gait and mobility: Secondary | ICD-10-CM

## 2022-09-11 DIAGNOSIS — F09 Unspecified mental disorder due to known physiological condition: Secondary | ICD-10-CM | POA: Diagnosis not present

## 2022-09-11 DIAGNOSIS — G35 Multiple sclerosis: Secondary | ICD-10-CM

## 2022-09-11 DIAGNOSIS — Z79899 Other long term (current) drug therapy: Secondary | ICD-10-CM

## 2022-09-11 MED ORDER — FLUCONAZOLE 150 MG PO TABS: ORAL_TABLET | ORAL | 0 refills | Status: DC

## 2022-09-11 NOTE — Telephone Encounter (Signed)
Pt said TYSABRI 300 MG/15ML injection infusion throwing off PH balance, having yeast infection. Prescribed medication from physician but is not helping. Would like a call from the nurse.

## 2022-09-11 NOTE — Telephone Encounter (Signed)
Called pt back. She has appt 09/14/22 with Dr. Felecia Shelling. Dr. Felecia Shelling said he can further discuss at this appt. Pt preferred to come in for sooner appt today at 2:30pm, check in 2:00pm to discuss. Appt for 09/14/22 cx since she is coming in sooner.

## 2022-09-11 NOTE — Progress Notes (Signed)
[facility]  PATIENT: Connie Robertson DOB: 05-Jul-1971  REFERRING DOCTOR OR PCP: Dr. Jannifer Franklin SOURCE: Patient, notes from Dr. Jannifer Franklin, imaging and lab reports.  _________________________________   HISTORICAL  CHIEF COMPLAINT:  Chief Complaint  Patient presents with   Room 1    Pt is here Alone. Pt states things haven't been going good.     HISTORY OF PRESENT ILLNESS:  She is a 52 year old woman with relapsing remitting MS currently on Tysabri therapy  Update Update 09/11/2022: She has bene on Tysabri but is having more issues with it lately.Last Tysabri was one week ago.     She was tolerating it well iinitially but now feels much more fatigued after each dose.   Also,  she reports having more gynecologic fungal infections since going back on Tysabri.       She also reports urine smells worse at times.       Currently, balance is poor but she can walk without a cane.  She has not had any falls.  Leg strength is fairly symmetric.  She sometimes has some numbness in the legs.  She has dysesthesias that are helped by gabapentin and Dilantin a little bit  She also has a neurogenic bladder.  She gets sone UTIs but MS does not seem worse when that occurs.    Her main issue is reduced cognition  Focus, attention, executive function and word finding are doing worse.   She reports reduced vision, unchanged compared to the last visit.  She continues to have a lot of headache, chronic migraine with 30/30 days.  She was prescribed Quilipta but  stayed on [place].   It was well tolersted.      Other med's and Botox have not helped.         Medications tried and failed for migraine: Anti-epileptics (topiramate, gabapent, dilantin, depakote, zonisamide), muscle relaxants, Botox, NSAIDs, tricyclics (nortriptyline), steroids, Aimovig, trigger point injections and   MS history: She was diagnosed with MS about 25 years.   She presented right facial weakness and twitching.    Initially she was felt to have Bells Palsy.  Studies were consistent with MS.   She was initially on the interferons and glatiramer.  She saw Dr. Erling Cruz initially and then moved to the [facility].  She switched to [place] and tolerated it well.   However, she began to experience exacerbation (vision, numbness, balance).   She moved to [facility] and switched to Tysabri in 2017 due to the breakthrough activity she has tolerated Tysabri well..   She is JCV antibody negative last checked 01/24/2021.   Possible small exacerbation vs fluctuation 03/2021.     Fluctuation vs. Small relapse with legs heavy 03/2022.      Migraine history: She has a long history of chronic migraine.  She currently is seeing Dr. Domingo Cocking.  Injections have not helped.  She did not get a benefit from Botox or Aimovig.  She is on Depakote and Topamax without much benefit.  When migraine occurs she notes visual blurring.   Imaging:  MRI of the brain 05/26/2020 shows multiple T2/FLAIR hyperintense foci in the periventricular, juxtacortical, and deep white matter of the hemispheres and in the pons and cerebellum.  There was no change compared to 05/27/2019.   MRI of the cervical spine 05/26/2020 showed multiple foci within the spinal cord.  There is a large T2 hyperintense focus posteriorly to the left adjacent to C3 and smaller foci laterally to the right adjacent to C3,  laterally to the left adjacent to C3, laterally to the right adjacent to C4, laterally to the left adjacent to C6, laterally to the left adjacent to C7, posteriorly adjacent to C7-T1 and laterally to the right adjacent to T1.  No change compared to the MRI 09/05/2018.  She also had mild degenerative changes at C5-C6 and C6-C7 but no nerve root compression or spinal stenosis.  REVIEW OF SYSTEMS: Constitutional: No fevers, chills, sweats, or change in appetite Eyes: No visual changes, double vision, eye pain Ear, nose and throat: No hearing loss, ear pain, nasal congestion, sore  throat Cardiovascular: No chest pain, palpitations Respiratory:  No shortness of breath at rest or with exertion.   No wheezes GastrointestinaI: No nausea, vomiting, diarrhea, abdominal pain, fecal incontinence Genitourinary:  No dysuria, urinary retention or frequency.  No nocturia. Musculoskeletal:  No neck pain, back pain Integumentary: No rash, pruritus, skin lesions Neurological: as above Psychiatric: No depression at this time.  No anxiety Endocrine: No palpitations, diaphoresis, change in appetite, change in weigh or increased thirst Hematologic/Lymphatic:  No anemia, purpura, petechiae. Allergic/Immunologic: No itchy/runny eyes, nasal congestion, recent allergic reactions, rashes  ALLERGIES: No Known Allergies  HOME MEDICATIONS:  Current Outpatient Medications:    Atogepant (QULIPTA) 60 MG TABS, One po qd, Disp: 30 tablet, Rfl: 11   fluconazole (DIFLUCAN) 150 MG tablet, One po prn fungal infection, Disp: 4 tablet, Rfl: 0   folic acid (FOLVITE) 1 MG tablet, Take 1 mg by mouth daily., Disp: , Rfl:    lisinopril-hydrochlorothiazide (ZESTORETIC) 10-12.5 MG tablet, Take 1 tablet by mouth daily., Disp: , Rfl:    mirabegron ER (MYRBETRIQ) 50 MG TB24 tablet, Take 1 tablet (50 mg total) by mouth daily., Disp: 90 tablet, Rfl: 3   modafinil (PROVIGIL) 100 MG tablet, Take 1 tablet (100 mg total) by mouth daily., Disp: 30 tablet, Rfl: 3   phentermine 37.5 MG capsule, Take 37.5 mg by mouth every morning., Disp: , Rfl:    phenytoin (DILANTIN) 100 MG ER capsule, Take 2 capsules at night, Disp: 180 capsule, Rfl: 0   tiZANidine (ZANAFLEX) 4 MG tablet, Take 4 mg by mouth 2 (two) times daily as needed., Disp: , Rfl:    topiramate (TOPAMAX) 200 MG tablet, Take 100 mg by mouth daily., Disp: , Rfl:    TYSABRI 300 MG/15ML injection, INFUSE 300MG  IV PER  PRESCRIBING INFORMATION  EVERY 4 WEEKS AS DIRECTED  (DISCARD UNUSED), Disp: 15 mL, Rfl: 11   valACYclovir (VALTREX) 500 MG tablet, Take 1 tablet (500  mg total) by mouth daily., Disp: 30 tablet, Rfl: 6   Vitamin D, Ergocalciferol, (DRISDOL) 1.25 MG (50000 UNIT) CAPS capsule, Take 50,000 Units by mouth every 7 (seven) days., Disp: , Rfl:   PAST MEDICAL HISTORY: Past Medical History:  Diagnosis Date   Abnormality of gait 11/25/2014   Chronic daily headache 2003   Love   Depression    Memory disorder 02/29/2016   MS (multiple sclerosis) (HCC) 1992   Dr. 03/02/2016   Multiple sclerosis (HCC) 11/21/2013   Neurogenic bladder 11/21/2013    PAST SURGICAL HISTORY: Past Surgical History:  Procedure Laterality Date   ABLATION COLPOCLESIS  2004   CESAREAN SECTION     TUBAL LIGATION      FAMILY HISTORY: Family History  Problem Relation Age of Onset   Heart failure Father    Multiple sclerosis Sister    Cancer Neg Hx    Diabetes Neg Hx    Hyperlipidemia Neg Hx  Hypertension Neg Hx    Kidney disease Neg Hx    Stroke Neg Hx     SOCIAL HISTORY:  Social History   Socioeconomic History   Marital status: Divorced    Spouse name: Not on file   Number of children: 2   Years of education: college   Highest education level: Not on file  Occupational History   Occupation: disability  Tobacco Use   Smoking status: Never   Smokeless tobacco: Never  Vaping Use   Vaping Use: Never used  Substance and Sexual Activity   Alcohol use: Yes    Comment: social   Drug use: No   Sexual activity: Yes    Birth control/protection: Surgical  Other Topics Concern   Not on file  Social History Narrative   Patient is right handed.   Patient drinks 2 cups of coffee daily.   Social Determinants of Health   Financial Resource Strain: Not on file  Food Insecurity: Not on file  Transportation Needs: Not on file  Physical Activity: Not on file  Stress: Not on file  Social Connections: Not on file  Intimate Partner Violence: Not on file     PHYSICAL EXAM  Vitals:   09/11/22 1412  BP: 111/71  Pulse: 69  Weight: 212 lb (96.2 kg)  Height: 5'  4" (1.626 m)    Body mass index is 36.39 kg/m.   General: The patient is well-developed and well-nourished and in no acute distress  HEENT:  Head is [place]/AT.  Sclera are anicteric.      Skin: Extremities are without rash or  edema.  Neurologic Exam  Mental status: The patient is alert and oriented x 3 at the time of the examination. The patient has apparent normal recent and remote memory.  She was distractible with reduced focus/attention.Marland Kitchen   Speech is normal.  Cranial nerves: Extraocular movements are full.  Color vision is reduced on the right  Visual acuity was more symmetric.  Facial symmetry is present. There is good facial sensation to soft touch bilaterally.Facial strength is normal.  Trapezius and sternocleidomastoid strength is normal. No dysarthria is noted.   No obvious hearing deficits are noted.  Motor:  Muscle bulk is normal.   Tone is normal. Strength is  5 / 5 in all 4 extremities.   Sensory: Sensory testing is intact to pinprick, soft touch and vibration sensation in the arms.  She had reduced sensation to touch and vibration in the left leg.  Coordination: Cerebellar testing reveals good finger-nose-finger and heel-to-shin bilaterally.  Gait and station: Station is normal.   The gait is wide but she can walk without a cane in the office.  She is unable to do a tandem gait and the Romberg is borderline.   Reflexes: Deep tendon reflexes are symmetric and normal bilaterally.        DIAGNOSTIC DATA (LABS, IMAGING, TESTING) - I reviewed patient records, labs, notes, testing and imaging myself where available.  Lab Results  Component Value Date   WBC 4.8 08/08/2022   HGB 13.4 08/08/2022   HCT 40.9 08/08/2022   MCV 89 08/08/2022   PLT 178 08/08/2022      Component Value Date/Time   NA 143 09/07/2021 1220   NA 142 04/05/2015 1435   K 3.6 09/07/2021 1220   K 3.9 04/05/2015 1435   CL 104 09/07/2021 1220   CO2 22 09/07/2021 1220   CO2 24 04/05/2015 1435    GLUCOSE 67 (L) 09/07/2021 1220  GLUCOSE 93 03/23/2016 1402   GLUCOSE 89 04/05/2015 1435   BUN 11 09/07/2021 1220   BUN 18.2 04/05/2015 1435   CREATININE 1.15 (H) 09/07/2021 1220   CREATININE 1.1 04/05/2015 1435   CALCIUM 9.6 09/07/2021 1220   CALCIUM 9.3 04/05/2015 1435   PROT 6.8 09/07/2021 1220   PROT 6.9 04/05/2015 1435   ALBUMIN 4.5 09/07/2021 1220   ALBUMIN 4.2 04/05/2015 1435   AST 22 09/07/2021 1220   AST 27 04/05/2015 1435   ALT 18 09/07/2021 1220   ALT 46 04/05/2015 1435   ALKPHOS 78 09/07/2021 1220   ALKPHOS 53 04/05/2015 1435   BILITOT <0.2 09/07/2021 1220   BILITOT 0.22 04/05/2015 1435   GFRNONAA 76 06/01/2020 1112   GFRAA 88 06/01/2020 1112   Lab Results  Component Value Date   CHOL 191 08/18/2011   HDL 66.10 08/18/2011   LDLCALC 103 (H) 08/18/2011   TRIG 112.0 08/18/2011   CHOLHDL 3 08/18/2011   No results found for: "HGBA1C" Lab Results  Component Value Date   VITAMINB12 470 04/05/2015   Lab Results  Component Value Date   TSH 1.80 08/18/2011       ASSESSMENT AND PLAN  Relapsing remitting multiple sclerosis ([place]) - Plan: Hepatitis B surface antigen, HIV Antibody (routine testing w rflx), QuantiFERON-TB Gold Plus, Hepatitis B core antibody, total, Hepatitis B surface antibody,qualitative, CBC with Differential/Platelet, Comprehensive metabolic panel  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 Cognition is progressively  worsening.   This has led to disability.   Consider neurocognitive testing if continues to worsen.    Stay active and exercise as tolerated. Continue anti-CGRP treatment for migraines. RTC in 6  months but she should call sooner if new or worsening symptoms.    Adele Milson A. Felecia Shelling, MD, [facility] 04/10/5037, 8:82 PM Certified in Neurology, Clinical Neurophysiology, Sleep Medicine and Neuroimaging  [facility] [address], [place], [place] [address] [phone]

## 2022-09-14 ENCOUNTER — Telehealth: Payer: Self-pay | Admitting: *Deleted

## 2022-09-14 ENCOUNTER — Ambulatory Visit: Payer: 59 | Admitting: Neurology

## 2022-09-14 LAB — CBC WITH DIFFERENTIAL/PLATELET
Basophils Absolute: 0 10*3/uL (ref 0.0–0.2)
Basos: 1 %
EOS (ABSOLUTE): 0.1 10*3/uL (ref 0.0–0.4)
Eos: 2 %
Hematocrit: 35.8 % (ref 34.0–46.6)
Hemoglobin: 11.9 g/dL (ref 11.1–15.9)
Immature Grans (Abs): 0 10*3/uL (ref 0.0–0.1)
Immature Granulocytes: 0 %
Lymphocytes Absolute: 3.4 10*3/uL — ABNORMAL HIGH (ref 0.7–3.1)
Lymphs: 52 %
MCH: 29.6 pg (ref 26.6–33.0)
MCHC: 33.2 g/dL (ref 31.5–35.7)
MCV: 89 fL (ref 79–97)
Monocytes Absolute: 0.5 10*3/uL (ref 0.1–0.9)
Monocytes: 8 %
NRBC: 1 % — ABNORMAL HIGH (ref 0–0)
Neutrophils Absolute: 2.4 10*3/uL (ref 1.4–7.0)
Neutrophils: 37 %
Platelets: 185 10*3/uL (ref 150–450)
RBC: 4.02 x10E6/uL (ref 3.77–5.28)
RDW: 14.1 % (ref 11.7–15.4)
WBC: 6.5 10*3/uL (ref 3.4–10.8)

## 2022-09-14 LAB — COMPREHENSIVE METABOLIC PANEL
ALT: 15 IU/L (ref 0–32)
AST: 18 IU/L (ref 0–40)
Albumin/Globulin Ratio: 2.5 — ABNORMAL HIGH (ref 1.2–2.2)
Albumin: 4.8 g/dL (ref 3.8–4.9)
Alkaline Phosphatase: 53 IU/L (ref 44–121)
BUN/Creatinine Ratio: 22 (ref 9–23)
BUN: 22 mg/dL (ref 6–24)
Bilirubin Total: <0.2 mg/dL (ref 0.0–1.2)
CO2: 23 mmol/L (ref 20–29)
Calcium: 9.5 mg/dL (ref 8.7–10.2)
Chloride: 108 mmol/L — ABNORMAL HIGH (ref 96–106)
Creatinine, Ser: 0.98 mg/dL (ref 0.57–1.00)
Globulin, Total: 1.9 g/dL (ref 1.5–4.5)
Glucose: 80 mg/dL (ref 70–99)
Potassium: 3.6 mmol/L (ref 3.5–5.2)
Sodium: 145 mmol/L — ABNORMAL HIGH (ref 134–144)
Total Protein: 6.7 g/dL (ref 6.0–8.5)
eGFR: 69 mL/min/{1.73_m2} (ref >59)

## 2022-09-14 LAB — QUANTIFERON-TB GOLD PLUS
QuantiFERON Mitogen Value: >10 IU/mL
QuantiFERON Nil Value: 0.01 IU/mL
QuantiFERON TB1 Ag Value: 0.03 IU/mL
QuantiFERON TB2 Ag Value: 0.03 IU/mL
QuantiFERON-TB Gold Plus: NEGATIVE

## 2022-09-14 LAB — HEPATITIS B SURFACE ANTIGEN: Hepatitis B Surface Ag: NEGATIVE

## 2022-09-14 LAB — HEPATITIS B SURFACE ANTIBODY,QUALITATIVE: Hep B Surface Ab, Qual: NONREACTIVE

## 2022-09-14 LAB — HEPATITIS B CORE ANTIBODY, TOTAL: Hep B Core Total Ab: NEGATIVE

## 2022-09-14 LAB — HIV ANTIBODY (ROUTINE TESTING W REFLEX): HIV Screen 4th Generation wRfx: NONREACTIVE

## 2022-09-14 NOTE — Telephone Encounter (Signed)
-----   Message from Britt Bottom, MD sent at 09/14/2022  8:33 AM EST ----- Labs are fine to switch to [facility], the pill that I had discussed with her, if she is interested.  I think I had her sign a form.

## 2022-09-14 NOTE — Telephone Encounter (Signed)
Patient informed with below note. She agreed to try [facility],

## 2022-09-19 NOTE — Telephone Encounter (Signed)
Form filled out/signed and faxed to [facility] at [phone]

## 2022-09-20 NOTE — Telephone Encounter (Signed)
Received fax from [place] that [place] required for [facility]. Can be completed via key: [id number] on covermymeds.

## 2022-09-21 NOTE — Telephone Encounter (Signed)
PA for [facility] sent to [facility] via CMM. KeyAY:[id number]. Should have a determination within 3-5 business days.

## 2022-09-21 NOTE — Telephone Encounter (Signed)
Cyril Mourning, I attached the office note, thanks!

## 2022-09-21 NOTE — Telephone Encounter (Signed)
Completed PA for [facility] via CMM.  Key: [id number]

## 2022-09-25 ENCOUNTER — Encounter: Payer: Self-pay | Admitting: *Deleted

## 2022-09-25 NOTE — Telephone Encounter (Signed)
Reviewed and appears PA denied 09/21/22 for the following reason:   Mavenclad is denied because it is not on your plan's Drug List (formulary). Medication authorization requires the following: (1) You need to try one (1) of these covered drugs: (a) Dimethyl fumarate. (b) Mayzent Starter Pack. (c) Ocrevus*. (d) Teriflunomide. (e) Vumerity*. (2) OR your doctor needs to give [place] specific medical reasons why one (1) of the covered drug(s) is not appropriate for you.

## 2022-09-25 NOTE — Telephone Encounter (Signed)
Connie Robertson is calling from [employer]. Stated she is following up on the appeal for mavenclad.

## 2022-09-25 NOTE — Telephone Encounter (Signed)
Dr. Felecia Shelling- how would you like to proceed?

## 2022-09-25 NOTE — Telephone Encounter (Signed)
Faxed appeal letter to [employer] appeals at [phone]. Marked urgent, waiting on determination.

## 2022-09-27 NOTE — Telephone Encounter (Signed)
[facility] called to report appeal approved and pt has been notofied  via voicemail. Approval dates 2/13-12/31/24 appeal# [id number] call back # [phone]

## 2022-10-04 NOTE — Telephone Encounter (Signed)
I called [employer].  I spoke with Addie.  I advised her that the [facility] appeal was approved.  They will work on getting [place] to the patient.  I will continue to follow.

## 2022-10-10 NOTE — Telephone Encounter (Signed)
I called patient to discuss if she has received Mavenclad.  No answer, left a voicemail asking her to call us back.  If patient calls back please confirm that she has received her [place].

## 2022-10-10 NOTE — Telephone Encounter (Signed)
Patient called me back and reports that she spoke with [employer].  [employer] reported to her that something is needed from our office.  I called [employer], was forced to leave a voicemail asking them to call us back.

## 2022-10-10 NOTE — Telephone Encounter (Signed)
Patient returned my call.  She reports that she has not had any phone calls from [place].  I gave her [employer] phone number and asked her to call them to check on the status of her [place].  I will check on her next week to make sure that she has made progress.  She will call us in the interim with any questions or concerns.

## 2022-10-11 NOTE — Telephone Encounter (Signed)
We had form from [employer] received this week to clear pt to start therapy. This was filled out this am. Advised pt can start [place] immediately. [phone]. Received fax confirmation.

## 2022-10-11 NOTE — Telephone Encounter (Signed)
Took call from phone staff and spoke with [employer]/Addy (case manager for our office). Phone [phone] ext [phone]. Reports they received clearance form stating pt could start therapy. She spoke with pt this am and advised rx sent to [facility]. Nothing further needed at this time.

## 2022-10-18 NOTE — Telephone Encounter (Signed)
I called patient to discuss if she has received her [place] prescription.  No answer, left a voicemail asking her to call us back.  If patient calls back, please confirm that she has received her [facility] prescription from [place].

## 2022-10-30 ENCOUNTER — Telehealth: Payer: Self-pay | Admitting: Neurology

## 2022-10-30 NOTE — Telephone Encounter (Signed)
Patient completed Y1M1 of mavenclad 10/24/22-10/28/22

## 2022-10-30 NOTE — Telephone Encounter (Signed)
Called pt back. She just got off phone with [employer] for [place]. She took month 1: 10/24/22-10/28/22. Due mid April for month 2.   Scheduled shipment for 11/14/22. She continues to have issues with walking. Feels a little worse since visit 09/11/22. She did have good friend pass away after last visit and has been and under more stress. Aware stress can worsen MS sx. She will continue [place] for now and will call if she has any new/worsening sx moving forward. Aware I will send to Dr. Felecia Shelling as an update.  Update- she went for disability hearing this past Sat to provide updates. Already on disability.

## 2022-10-30 NOTE — Telephone Encounter (Signed)
Pt called. Stated she would like to talk to nurse about getting on another infusion that not going to interfere with her PH balance. Pt is requesting a call back from nurse to discuss.

## 2022-11-01 ENCOUNTER — Other Ambulatory Visit: Payer: Self-pay | Admitting: Internal Medicine

## 2022-11-01 DIAGNOSIS — Z1231 Encounter for screening mammogram for malignant neoplasm of breast: Secondary | ICD-10-CM

## 2022-11-09 ENCOUNTER — Telehealth: Payer: Self-pay | Admitting: Neurology

## 2022-11-09 ENCOUNTER — Other Ambulatory Visit: Payer: Self-pay | Admitting: Neurology

## 2022-11-09 MED ORDER — PREDNISONE 50 MG PO TABS: ORAL_TABLET | ORAL | 0 refills | Status: DC

## 2022-11-09 NOTE — Telephone Encounter (Signed)
Called and spoke w/ pt. She received year 1, month 2 of [place] recently from mail order specialty pharmacy and due to take 11/14/22.  Pt reports legs worse since last week. Hard for her to walk. Having more tingling in her legs that started yesterday and is constant.  Denies any illness or sickness currently. No other new meds started recently. She would like to consider going back to an infusion drug if possible. Feels Mavenclad not working as well. I did go over again what I discussed below with her. She would like to know what Dr. Felecia Shelling recommends at this point. Aware I will send to him for review and will call back.     Conversation I had with pt last week 10/30/22:  "Called pt back. She just got off phone with [employer] for [place]. She took month 1: 10/24/22-10/28/22. Due mid April for month 2.   Scheduled shipment for 11/14/22. She continues to have issues with walking. Feels a little worse since visit 09/11/22. She did have good friend pass away after last visit and has been and under more stress. Aware stress can worsen MS sx. She will continue [place] for now and will call if she has any new/worsening sx moving forward. Aware I will send to Dr. Felecia Shelling as an update. Update- she went for disability hearing this past Sat to provide updates. Already on disability. "

## 2022-11-09 NOTE — Telephone Encounter (Signed)
Pt stated she needs to talk to nurse about  Cladribine, 9 Tabs, (MAVENCLAD, 9 TABS,) 10 MG TBPK. Stated she wants to talk with nurse about starting back Infusion that want upset her PH balance. Pt stated her legs are bothering her to the point she can hardly walk.

## 2022-11-09 NOTE — Telephone Encounter (Signed)
Called and spoke with pt. Relayed Dr. Garth Bigness recommendation. She is agreeable to plan. She will call back if prednisone ineffective.

## 2022-11-20 ENCOUNTER — Other Ambulatory Visit: Payer: Self-pay

## 2022-11-20 MED ORDER — DIVALPROEX SODIUM 500 MG PO DR TAB: 500.0000 mg | DELAYED_RELEASE_TABLET | Freq: Two times a day (BID) | ORAL | 5 refills | Status: DC

## 2022-11-20 NOTE — Telephone Encounter (Signed)
Pt stated she is done with Cladribine, 9 Tabs, (MAVENCLAD, 9 TABS,) 10 MG TBPK and wants to know what's the next step. Pt stated she was told to follow-up with Dr. Epimenio Foot,

## 2022-11-20 NOTE — Telephone Encounter (Addendum)
Called pt back. Tolerated year 1, month 2 of Mavenclad well. She accidentally took early (started 11/14/22). Supposed to have started 11/24/22. Did not take prednisone prescribed 11/09/22. States she is not a fan of prednisone and did not want to take . Currently doing walking/stretching. Trying to stay active as much as she can. Doing ok.  She also reports she has been taking depakote 500mg  po intermittently (supposed to take BID). Last prescribed by Dr. Anne Hahn 2022 for migraine management. She has old rx bottle from 08/24/20. Still having a lot of migraines. Wondering what Dr. Epimenio Foot recommends for this.   Also states she has a hard time sleeping. She has tried: Zzzquil, advil pm, melatonin. PCP (Dr. Wynelle Link) called in zolpidem 10mg  on 11/13/22 and only helping some. Her brother takes Belsomra to help her sleep and she inquired about this. I recommended she f/u with Dr. Wynelle Link about sleeping issues and recommendations.

## 2022-11-20 NOTE — Telephone Encounter (Addendum)
Called pt and told her that Dr. Epimenio Foot said that if depakote was helping, she should take as direcred --- if no benefit he wants her to stop it and start Keppra 750 mg po bid. Pt said that the Depakote helped her and that she wanted to get back on it.

## 2022-11-20 NOTE — Addendum Note (Signed)
Addended by: Arther Abbott on: 11/20/2022 11:23 AM   Modules accepted: Orders

## 2022-12-11 ENCOUNTER — Other Ambulatory Visit: Payer: Self-pay | Admitting: Internal Medicine

## 2022-12-12 LAB — COMPLETE METABOLIC PANEL WITH GFR
AG Ratio: 1.8 (calc) (ref 1.0–2.5)
ALT: 15 U/L (ref 6–29)
AST: 16 U/L (ref 10–35)
Albumin: 4.6 g/dL (ref 3.6–5.1)
Alkaline phosphatase (APISO): 49 U/L (ref 37–153)
BUN: 16 mg/dL (ref 7–25)
CO2: 25 mmol/L (ref 20–32)
Calcium: 9.5 mg/dL (ref 8.6–10.4)
Chloride: 109 mmol/L (ref 98–110)
Creat: 0.96 mg/dL (ref 0.50–1.03)
Globulin: 2.6 g/dL (calc) (ref 1.9–3.7)
Glucose, Bld: 110 mg/dL — ABNORMAL HIGH (ref 65–99)
Potassium: 3.9 mmol/L (ref 3.5–5.3)
Sodium: 145 mmol/L (ref 135–146)
Total Bilirubin: 0.3 mg/dL (ref 0.2–1.2)
Total Protein: 7.2 g/dL (ref 6.1–8.1)
eGFR: 71 mL/min/{1.73_m2} (ref >=60)

## 2022-12-12 LAB — CBC
HCT: 40.8 % (ref 35.0–45.0)
Hemoglobin: 13.4 g/dL (ref 11.7–15.5)
MCH: 30.9 pg (ref 27.0–33.0)
MCHC: 32.8 g/dL (ref 32.0–36.0)
MCV: 94 fL (ref 80.0–100.0)
MPV: 11.3 fL (ref 7.5–12.5)
Platelets: 167 10*3/uL (ref 140–400)
RBC: 4.34 10*6/uL (ref 3.80–5.10)
RDW: 15.5 % — ABNORMAL HIGH (ref 11.0–15.0)
WBC: 4.9 10*3/uL (ref 3.8–10.8)

## 2022-12-12 LAB — LIPID PANEL
Cholesterol: 214 mg/dL — ABNORMAL HIGH (ref <200)
HDL: 69 mg/dL (ref >=50)
LDL Cholesterol (Calc): 129 mg/dL (calc) — ABNORMAL HIGH
Non-HDL Cholesterol (Calc): 145 mg/dL (calc) — ABNORMAL HIGH (ref <130)
Total CHOL/HDL Ratio: 3.1 (calc) (ref <5.0)
Triglycerides: 67 mg/dL (ref <150)

## 2022-12-12 LAB — VITAMIN B12: Vitamin B-12: 438 pg/mL (ref 200–1100)

## 2022-12-12 LAB — FOLATE: Folate: 7.2 ng/mL

## 2022-12-12 LAB — TSH: TSH: 0.48 mIU/L

## 2022-12-12 LAB — VITAMIN D 25 HYDROXY (VIT D DEFICIENCY, FRACTURES): Vit D, 25-Hydroxy: 17 ng/mL — ABNORMAL LOW (ref 30–100)

## 2022-12-18 ENCOUNTER — Ambulatory Visit: Payer: Medicare Other

## 2022-12-27 LAB — COLOGUARD: COLOGUARD: NEGATIVE

## 2023-01-03 ENCOUNTER — Ambulatory Visit
Admission: RE | Admit: 2023-01-03 | Discharge: 2023-01-03 | Disposition: A | Discharge status: Home / Self Care | Payer: 59 | Source: Ambulatory Visit | Attending: Internal Medicine | Admitting: Internal Medicine

## 2023-01-03 DIAGNOSIS — Z1231 Encounter for screening mammogram for malignant neoplasm of breast: Secondary | ICD-10-CM

## 2023-02-20 ENCOUNTER — Ambulatory Visit: Payer: 59

## 2023-03-12 ENCOUNTER — Ambulatory Visit: Payer: 59 | Admitting: Obstetrics and Gynecology

## 2023-03-30 ENCOUNTER — Other Ambulatory Visit: Payer: Self-pay | Admitting: Neurology

## 2023-03-30 ENCOUNTER — Telehealth: Payer: Self-pay | Admitting: Neurology

## 2023-03-30 DIAGNOSIS — N319 Neuromuscular dysfunction of bladder, unspecified: Secondary | ICD-10-CM

## 2023-03-30 DIAGNOSIS — G35 Multiple sclerosis: Secondary | ICD-10-CM

## 2023-03-30 MED ORDER — MIRABEGRON ER 50 MG PO TB24: 50.0000 mg | ORAL_TABLET | Freq: Every day | ORAL | 0 refills | Status: DC

## 2023-03-30 NOTE — Telephone Encounter (Signed)
Pt states she is down to 1 pill of mirabegron ER (MYRBETRIQ) 50 MG TB24 tablet , she is asking if on call Dr Can please have this called into  [facility] #[id number]

## 2023-03-30 NOTE — Telephone Encounter (Signed)
Meds sent to [facility]. Thanks

## 2023-04-03 ENCOUNTER — Ambulatory Visit: Payer: 59 | Admitting: Obstetrics and Gynecology

## 2023-04-03 MED ORDER — MIRABEGRON ER 50 MG PO TB24: 50.0000 mg | ORAL_TABLET | Freq: Every day | ORAL | 0 refills | Status: DC

## 2023-04-03 NOTE — Telephone Encounter (Signed)
Pt is now asking if this mirabegron ER (MYRBETRIQ) 50 MG TB24 tablet can be called into [facility] #[id number] since the [id number]  does not have it in stock

## 2023-04-03 NOTE — Telephone Encounter (Signed)
Refill sent to the pharmacy requested.  ?

## 2023-04-03 NOTE — Addendum Note (Signed)
Addended by: Judi Cong on: 04/03/2023 12:31 PM   Modules accepted: Orders

## 2023-04-04 ENCOUNTER — Other Ambulatory Visit: Payer: Self-pay

## 2023-04-04 MED ORDER — TOPIRAMATE 200 MG PO TABS: 100.0000 mg | ORAL_TABLET | Freq: Every day | ORAL | 1 refills | Status: AC

## 2023-04-25 ENCOUNTER — Telehealth: Payer: Self-pay | Admitting: Neurology

## 2023-05-03 NOTE — Telephone Encounter (Signed)
Error

## 2023-05-15 ENCOUNTER — Other Ambulatory Visit: Payer: Self-pay | Admitting: Urology

## 2023-05-16 NOTE — Patient Instructions (Signed)
DUE TO COVID-19 ONLY TWO VISITORS  (aged 54 and older)  ARE ALLOWED TO COME WITH YOU AND STAY IN THE WAITING ROOM ONLY DURING PRE OP AND PROCEDURE.   **NO VISITORS ARE ALLOWED IN THE SHORT STAY AREA OR RECOVERY ROOM!!**  IF YOU WILL BE ADMITTED INTO THE HOSPITAL YOU ARE ALLOWED ONLY FOUR SUPPORT PEOPLE DURING VISITATION HOURS ONLY (7 AM -8PM)   The support person(s) must pass our screening, gel in and out, and wear a mask at all times, including in the patient's room. Patients must also wear a mask when staff or their support person are in the room. Visitors GUEST BADGE MUST BE WORN VISIBLY  One adult visitor may remain with you overnight and MUST be in the room by 8 P.M.     Your procedure is scheduled on: 05/28/23   Report to [facility] Main Entrance    Report to admitting at : 8:30 AM   Call this number if you have problems the morning of surgery [phone]   Do not eat food or drink: After Midnight.  FOLLOW ANY ADDITIONAL PRE OP INSTRUCTIONS YOU RECEIVED FROM YOUR SURGEON'S OFFICE!!!   Oral Hygiene is also important to reduce your risk of infection.                                    Remember - BRUSH YOUR TEETH THE MORNING OF SURGERY WITH YOUR REGULAR TOOTHPASTE  DENTURES WILL BE REMOVED PRIOR TO SURGERY PLEASE DO NOT APPLY "Poly grip" OR ADHESIVES!!!   Do NOT smoke after Midnight   Take these medicines the morning of surgery with A SIP OF WATER: divalproex,topiramate,mirabegron.  DO NOT TAKE ANY ORAL DIABETIC MEDICATIONS DAY OF YOUR SURGERY                  You may not have any metal on your body including hair pins, jewelry, and body piercing             Do not wear make-up, lotions, powders, perfumes/cologne, or deodorant  Do not wear nail polish including gel and S&S, artificial/acrylic nails, or any other type of covering on natural nails including finger and toenails. If you have artificial nails, gel coating, etc. that needs to be removed by a nail salon  please have this removed prior to surgery or surgery may need to be canceled/ delayed if the surgeon/ anesthesia feels like they are unable to be safely monitored.   Do not shave  48 hours prior to surgery.    Do not bring valuables to the hospital.  IS NOT             RESPONSIBLE   FOR VALUABLES.   Contacts, glasses, or bridgework may not be worn into surgery.   Bring small overnight bag day of surgery.   DO NOT BRING YOUR HOME MEDICATIONS TO THE HOSPITAL. PHARMACY WILL DISPENSE MEDICATIONS LISTED ON YOUR MEDICATION LIST TO YOU DURING YOUR ADMISSION IN THE HOSPITAL!    Patients discharged on the day of surgery will not be allowed to drive home.  Someone NEEDS to stay with you for the first 24 hours after anesthesia.   Special Instructions: Bring a copy of your healthcare power of attorney and living will documents         the day of surgery if you haven't scanned them before.  Please read over the following fact sheets you were given: IF YOU HAVE QUESTIONS ABOUT YOUR PRE-OP INSTRUCTIONS PLEASE CALL [phone]    [facility] - Preparing for Surgery Before surgery, you can play an important role.  Because skin is not sterile, your skin needs to be as free of germs as possible.  You can reduce the number of germs on your skin by washing with CHG (chlorahexidine gluconate) soap before surgery.  CHG is an antiseptic cleaner which kills germs and bonds with the skin to continue killing germs even after washing. Please DO NOT use if you have an allergy to CHG or antibacterial soaps.  If your skin becomes reddened/irritated stop using the CHG and inform your nurse when you arrive at Short Stay. Do not shave (including legs and underarms) for at least 48 hours prior to the first CHG shower.  You may shave your face/neck. Please follow these instructions carefully:  1.  Shower with CHG Soap the night before surgery and the  morning of Surgery.  2.  If you choose to wash your  hair, wash your hair first as usual with your  normal  shampoo.  3.  After you shampoo, rinse your hair and body thoroughly to remove the  shampoo.                           4.  Use CHG as you would any other liquid soap.  You can apply chg directly  to the skin and wash                       Gently with a scrungie or clean washcloth.  5.  Apply the CHG Soap to your body ONLY FROM THE NECK DOWN.   Do not use on face/ open                           Wound or open sores. Avoid contact with eyes, ears mouth and genitals (private parts).                       Wash face,  Genitals (private parts) with your normal soap.             6.  Wash thoroughly, paying special attention to the area where your surgery  will be performed.  7.  Thoroughly rinse your body with warm water from the neck down.  8.  DO NOT shower/wash with your normal soap after using and rinsing off  the CHG Soap.                9.  Pat yourself dry with a clean towel.            10.  Wear clean pajamas.            11.  Place clean sheets on your bed the night of your first shower and do not  sleep with pets. Day of Surgery : Do not apply any lotions/deodorants the morning of surgery.  Please wear clean clothes to the hospital/surgery center.  FAILURE TO FOLLOW THESE INSTRUCTIONS MAY RESULT IN THE CANCELLATION OF YOUR SURGERY PATIENT SIGNATURE_________________________________  NURSE SIGNATURE__________________________________  ________________________________________________________________________

## 2023-05-17 ENCOUNTER — Other Ambulatory Visit: Payer: Self-pay

## 2023-05-17 ENCOUNTER — Encounter (HOSPITAL_COMMUNITY): Payer: Self-pay

## 2023-05-17 ENCOUNTER — Encounter (HOSPITAL_COMMUNITY)
Admission: RE | Admit: 2023-05-17 | Discharge: 2023-05-17 | Disposition: A | Discharge status: Home / Self Care | Payer: 59 | Source: Ambulatory Visit | Attending: Urology | Admitting: Urology

## 2023-05-17 DIAGNOSIS — I10 Essential (primary) hypertension: Secondary | ICD-10-CM | POA: Diagnosis not present

## 2023-05-17 DIAGNOSIS — Z01818 Encounter for other preprocedural examination: Secondary | ICD-10-CM | POA: Diagnosis present

## 2023-05-17 HISTORY — DX: Essential (primary) hypertension: I10

## 2023-05-17 LAB — BASIC METABOLIC PANEL
Anion gap: 7 (ref 5–15)
BUN: 17 mg/dL (ref 6–20)
CO2: 28 mmol/L (ref 22–32)
Calcium: 9.1 mg/dL (ref 8.9–10.3)
Chloride: 103 mmol/L (ref 98–111)
Creatinine, Ser: 0.96 mg/dL (ref 0.44–1.00)
GFR, Estimated: >60 mL/min (ref >60)
Glucose, Bld: 91 mg/dL (ref 70–99)
Potassium: 3.5 mmol/L (ref 3.5–5.1)
Sodium: 138 mmol/L (ref 135–145)

## 2023-05-17 LAB — CBC
HCT: 39.4 % (ref 36.0–46.0)
Hemoglobin: 12.7 g/dL (ref 12.0–15.0)
MCH: 31.2 pg (ref 26.0–34.0)
MCHC: 32.2 g/dL (ref 30.0–36.0)
MCV: 96.8 fL (ref 80.0–100.0)
Platelets: 154 10*3/uL (ref 150–400)
RBC: 4.07 MIL/uL (ref 3.87–5.11)
RDW: 13.2 % (ref 11.5–15.5)
WBC: 2.5 10*3/uL — ABNORMAL LOW (ref 4.0–10.5)
nRBC: 0 % (ref 0.0–0.2)

## 2023-05-17 NOTE — Progress Notes (Signed)
For Short Stay: COVID SWAB appointment date:  Bowel Prep reminder:   For Anesthesia: PCP - Salli Real, MD  Cardiologist - N/A  Chest x-ray -  EKG - 05/17/23 Stress Test -  ECHO -  Cardiac Cath -  Pacemaker/ICD device last checked: Pacemaker orders received: Device Rep notified:  Spinal Cord Stimulator:N/A  Sleep Study - Yes CPAP - NO  Fasting Blood Sugar - N/A Checks Blood Sugar _____ times a day Date and result of last Hgb A1c-  Last dose of GLP1 agonist- N/A GLP1 instructions:   Last dose of SGLT-2 inhibitors- N/A SGLT-2 instructions:   Blood Thinner Instructions:N/A Aspirin Instructions: Last Dose:  Activity level: Can go up a flight of stairs and activities of daily living without stopping and without chest pain and/or shortness of breath   Able to exercise without chest pain and/or shortness of breath  Anesthesia review: Hx: HTN  Patient denies shortness of breath, fever, cough and chest pain at PAT appointment   Patient verbalized understanding of instructions that were given to them at the PAT appointment. Patient was also instructed that they will need to review over the PAT instructions again at home before surgery.

## 2023-05-17 NOTE — Progress Notes (Signed)
Lab. Results: WBC: 2.5

## 2023-05-22 ENCOUNTER — Telehealth: Payer: Self-pay

## 2023-05-22 NOTE — Telephone Encounter (Signed)
Received reasonable accomodation form from [employer]. Per MR, do not charge. Placed in POD 1 for completion

## 2023-05-23 NOTE — Telephone Encounter (Signed)
Called pt. Relayed message. She verbalized understanding. Scheduled appt with Dr. Epimenio Foot 05/29/23 at 9:30am. Aware form will be discussed at appt.

## 2023-05-23 NOTE — Telephone Encounter (Addendum)
Pt last seen 09/11/22 and has no follow up scheduled. Dr. Epimenio Foot wanted to see her back 6 months later for follow up.  Dx: MS. DMT: M[employer]clad. She needs to come in for updated visit/discuss form.   Going to call pt and offer 05/29/23 at 9:30am with Dr. Epimenio Foot

## 2023-05-26 ENCOUNTER — Other Ambulatory Visit: Payer: Self-pay

## 2023-05-26 ENCOUNTER — Emergency Department (HOSPITAL_COMMUNITY): Payer: 59

## 2023-05-26 ENCOUNTER — Emergency Department (HOSPITAL_COMMUNITY)
Admission: EM | Admit: 2023-05-26 | Discharge: 2023-05-27 | Discharge status: Against Medical Advice | Payer: 59 | Attending: Medical | Admitting: Medical

## 2023-05-26 DIAGNOSIS — R0781 Pleurodynia: Secondary | ICD-10-CM | POA: Insufficient documentation

## 2023-05-26 DIAGNOSIS — Z5321 Procedure and treatment not carried out due to patient leaving prior to being seen by health care provider: Secondary | ICD-10-CM | POA: Diagnosis not present

## 2023-05-26 DIAGNOSIS — R0602 Shortness of breath: Secondary | ICD-10-CM | POA: Diagnosis not present

## 2023-05-26 LAB — BASIC METABOLIC PANEL
Anion gap: 11 (ref 5–15)
BUN: 18 mg/dL (ref 6–20)
CO2: 22 mmol/L (ref 22–32)
Calcium: 9.2 mg/dL (ref 8.9–10.3)
Chloride: 109 mmol/L (ref 98–111)
Creatinine, Ser: 0.98 mg/dL (ref 0.44–1.00)
GFR, Estimated: >60 mL/min (ref >60)
Glucose, Bld: 89 mg/dL (ref 70–99)
Potassium: 3.6 mmol/L (ref 3.5–5.1)
Sodium: 142 mmol/L (ref 135–145)

## 2023-05-26 LAB — CBC WITH DIFFERENTIAL/PLATELET
Abs Immature Granulocytes: 0 10*3/uL (ref 0.00–0.07)
Basophils Absolute: 0 10*3/uL (ref 0.0–0.1)
Basophils Relative: 0 %
Eosinophils Absolute: 0.1 10*3/uL (ref 0.0–0.5)
Eosinophils Relative: 2 %
HCT: 38.2 % (ref 36.0–46.0)
Hemoglobin: 12.4 g/dL (ref 12.0–15.0)
Immature Granulocytes: 0 %
Lymphocytes Relative: 37 %
Lymphs Abs: 1.3 10*3/uL (ref 0.7–4.0)
MCH: 30.5 pg (ref 26.0–34.0)
MCHC: 32.5 g/dL (ref 30.0–36.0)
MCV: 94.1 fL (ref 80.0–100.0)
Monocytes Absolute: 0.3 10*3/uL (ref 0.1–1.0)
Monocytes Relative: 10 %
Neutro Abs: 1.8 10*3/uL (ref 1.7–7.7)
Neutrophils Relative %: 51 %
Platelets: 142 10*3/uL — ABNORMAL LOW (ref 150–400)
RBC: 4.06 MIL/uL (ref 3.87–5.11)
RDW: 13.5 % (ref 11.5–15.5)
WBC: 3.5 10*3/uL — ABNORMAL LOW (ref 4.0–10.5)
nRBC: 0 % (ref 0.0–0.2)

## 2023-05-26 MED ORDER — HYDROCODONE-ACETAMINOPHEN 5-325 MG PO TABS
1.0000 | ORAL_TABLET | Freq: Once | ORAL | Status: AC
  Administered 2023-05-26: 1 via ORAL
  Filled 2023-05-26: qty 1

## 2023-05-26 NOTE — ED Triage Notes (Signed)
Patient lost her balance and fell at home 2 days ago reports persistent pain at right lateral ribcage worse with deep inspiration .

## 2023-05-26 NOTE — ED Provider Triage Note (Signed)
Emergency Medicine Provider Triage Evaluation Note  Connie Robertson , a 52 y.o. female  was evaluated in triage.  Pt complains of rib cag pain. Reports attempting to reach while in bed and reports right ribcage pain. Has 11th rib cage fracture. Did xray at urgent care. Fall 2 days ago. Reports SOB.  Review of Systems  Positive: Rib cage pain, SOB Negative: hemoptysis  Physical Exam  There were no vitals taken for this visit. Gen:   Awake, no distress   Resp:  Normal effort  MSK:   Moves extremities without difficulty  Other:  Ecchymoses of Right thoracic back Medical Decision Making  Medically screening exam initiated at 7:36 PM.  Appropriate orders placed.  Connie Robertson was informed that the remainder of the evaluation will be completed by another provider, this initial triage assessment does not replace that evaluation, and the importance of remaining in the ED until their evaluation is complete.    Connie Robertson, [place] 05/26/23 1941

## 2023-05-26 NOTE — ED Notes (Signed)
Pt called X 3 for vitals recheck. Pt could not be found.  

## 2023-05-28 ENCOUNTER — Ambulatory Visit (HOSPITAL_COMMUNITY): Admission: RE | Admit: 2023-05-28 | Payer: 59 | Source: Ambulatory Visit | Admitting: Urology

## 2023-05-28 ENCOUNTER — Encounter (HOSPITAL_COMMUNITY): Admission: RE | Payer: Self-pay | Source: Ambulatory Visit

## 2023-05-28 DIAGNOSIS — Z01818 Encounter for other preprocedural examination: Secondary | ICD-10-CM

## 2023-05-28 DIAGNOSIS — I10 Essential (primary) hypertension: Secondary | ICD-10-CM

## 2023-05-28 SURGERY — BOTOX INJECTION
Anesthesia: General

## 2023-05-28 NOTE — Progress Notes (Signed)
Patient notified short stay staff this morning that she is not coming in for surgery today because she hurt her back

## 2023-05-29 ENCOUNTER — Ambulatory Visit (INDEPENDENT_AMBULATORY_CARE_PROVIDER_SITE_OTHER): Payer: 59 | Admitting: Neurology

## 2023-05-29 ENCOUNTER — Encounter: Payer: Self-pay | Admitting: Neurology

## 2023-05-29 VITALS — BP 134/81 | HR 58 | Ht 64.0 in | Wt 207.0 lb

## 2023-05-29 DIAGNOSIS — R269 Unspecified abnormalities of gait and mobility: Secondary | ICD-10-CM

## 2023-05-29 DIAGNOSIS — Z5181 Encounter for therapeutic drug level monitoring: Secondary | ICD-10-CM

## 2023-05-29 DIAGNOSIS — Z79899 Other long term (current) drug therapy: Secondary | ICD-10-CM

## 2023-05-29 DIAGNOSIS — R3915 Urgency of urination: Secondary | ICD-10-CM

## 2023-05-29 DIAGNOSIS — G35 Multiple sclerosis: Secondary | ICD-10-CM | POA: Diagnosis not present

## 2023-05-29 DIAGNOSIS — N319 Neuromuscular dysfunction of bladder, unspecified: Secondary | ICD-10-CM

## 2023-05-29 DIAGNOSIS — F09 Unspecified mental disorder due to known physiological condition: Secondary | ICD-10-CM

## 2023-05-29 MED ORDER — AIMOVIG 140 MG/ML ~~LOC~~ SOAJ: SUBCUTANEOUS | 12 refills | Status: DC

## 2023-05-29 NOTE — Progress Notes (Signed)
[facility]  PATIENT: Connie Robertson DOB: [birth year]  REFERRING DOCTOR OR PCP: Dr. Anne Hahn SOURCE: Patient, notes from Dr. Anne Hahn, imaging and lab reports.  _________________________________   HISTORICAL  CHIEF COMPLAINT:  Chief Complaint  Patient presents with   Follow-up    Pt in room 11 alone. Here for MS follow up on Mavenclad. Patient needs form filled out. Pt reports she fell last week and hurt her back.  Pt said MS standpoint she is doing well.     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 She accidentally took Month 2 early (started 11/14/22). Supposed to have started 11/24/22. Did not take prednisone prescribed 11/09/22. States she is not a fan of prednisone and did not want to take . Currently doing walking/stretching. Trying to stay active as much as she can. Doing ok.  Currently, balance is poor but she can walk without a cane.  She has not had any falls.  Leg strength is fairly symmetric.  She sometimes has some numbness in the legs.  She has dysesthesias that are helped by gabapentin and Dilantin a little bit  She also has a neurogenic bladder.  She gets sone UTIs but MS does not seem worse when that occurs.  She will be getting Botox soon.   She reports reduced vision, unchanged compared to the last visit.  Her main concern is reduced cognition  Focus, attention, executive function and word finding are doing worse.   She has been on phentermine and it helps slightly.  She has not tried a stronger stimulant like Adderall.   She fell and broke 11th rib last week.  She has been told it will need to heal on its own.   She hit the small of her back.   She [facility] to the [facility] and they did an X-ray reportedly showing an 11th rib Fx.   She went to the [facility] ED and  had a right rib xray that was normal.    She is taking hydrocodone and Tylenol  She has migraines and the combination of Aimovig with Depakote 500mg  po BID is helping the best    Medications tried and failed for migraine: Anti-epileptics (topiramate, gabapent, dilantin, depakote, zonisamide), muscle relaxants, Botox, NSAIDs, tricyclics (nortriptyline), steroids, Aimovig, trigger point injections and   MS history: She was diagnosed with MS about 25 years.   She presented right facial weakness and twitching.   Initially she was felt to have Bells Palsy.  Studies were consistent with MS.   She was initially on the interferons and glatiramer.  She saw Dr. Sandria Manly initially and then moved to the [facility].  She switched to Gilenya and tolerated it well.   However, she began to experience exacerbation (vision, numbness, balance).   She moved to [facility] and switched to Tysabri in 2017 due to the breakthrough activity she has tolerated Tysabri well..   She is JCV antibody negative last checked 01/24/2021.   Possible small exacerbation vs fluctuation 03/2021.     Fluctuation vs. Small relapse with legs heavy 03/2022.      Migraine history: She has a long history of chronic migraine.  She currently is seeing Dr. Neale Burly.  Injections have not helped.  She did not get a benefit from Botox or Aimovig.  She is on  Depakote and Topamax without much benefit.  When migraine occurs she notes visual blurring.   Imaging:  MRI of the brain 05/26/2020 shows multiple T2/FLAIR hyperintense foci in the periventricular, juxtacortical, and deep white matter of the hemispheres and in the pons and cerebellum.  There was no change compared to 05/27/2019.   MRI of the cervical spine 05/26/2020 showed multiple foci within the spinal cord.  There is a large T2 hyperintense focus posteriorly to the left adjacent to C3 and smaller foci laterally to the right adjacent to C3, laterally to the left adjacent to C3, laterally to the right  adjacent to C4, laterally to the left adjacent to C6, laterally to the left adjacent to C7, posteriorly adjacent to C7-T1 and laterally to the right adjacent to T1.  No change compared to the MRI 09/05/2018.  She also had mild degenerative changes at C5-C6 and C6-C7 but no nerve root compression or spinal stenosis.  REVIEW OF SYSTEMS: Constitutional: No fevers, chills, sweats, or change in appetite Eyes: No visual changes, double vision, eye pain Ear, nose and throat: No hearing loss, ear pain, nasal congestion, sore throat Cardiovascular: No chest pain, palpitations Respiratory:  No shortness of breath at rest or with exertion.   No wheezes GastrointestinaI: No nausea, vomiting, diarrhea, abdominal pain, fecal incontinence Genitourinary:  No dysuria, urinary retention or frequency.  No nocturia. Musculoskeletal:  No neck pain, back pain Integumentary: No rash, pruritus, skin lesions Neurological: as above Psychiatric: No depression at this time.  No anxiety Endocrine: No palpitations, diaphoresis, change in appetite, change in weigh or increased thirst Hematologic/Lymphatic:  No anemia, purpura, petechiae. Allergic/Immunologic: No itchy/runny eyes, nasal congestion, recent allergic reactions, rashes  ALLERGIES: No Known Allergies  HOME MEDICATIONS:  Current Outpatient Medications:    acetaminophen (TYLENOL) 500 MG tablet, Take 500 mg by mouth every 6 (six) hours as needed., Disp: , Rfl:    Cladribine, 9 Tabs, (MAVENCLAD, 9 TABS,) 10 MG TBPK, Take by mouth. Year 1 month 1: 10/24/22-10/28/22 Take 20 mg by mouth once daily on days 1-4. 10mg  on day 5. (Month 1) Take 20mg  by mouth once daily on days 1-3. 10mg  days 4-5. (Month 2), Disp: , Rfl:    divalproex (DEPAKOTE) 500 MG DR tablet, Take 1 tablet (500 mg total) by mouth 2 (two) times daily., Disp: 60 tablet, Rfl: 5   folic acid (FOLVITE) 1 MG tablet, Take 1 mg by mouth daily., Disp: , Rfl:    HYDROcodone-acetaminophen (NORCO/VICODIN) 5-325  MG tablet, Take 1 tablet by mouth every 6 (six) hours as needed., Disp: , Rfl:    lisinopril-hydrochlorothiazide (ZESTORETIC) 10-12.5 MG tablet, Take 1 tablet by mouth daily., Disp: , Rfl:    mirabegron ER (MYRBETRIQ) 50 MG TB24 tablet, Take 1 tablet (50 mg total) by mouth daily., Disp: 90 tablet, Rfl: 0   phentermine (ADIPEX-P) 37.5 MG tablet, Take 37.5 mg by mouth daily., Disp: , Rfl:    phenytoin (DILANTIN) 100 MG ER capsule, Take 2 capsules at night, Disp: 180 capsule, Rfl: 0   tiZANidine (ZANAFLEX) 4 MG tablet, Take 4 mg by mouth 2 (two) times daily as needed for muscle spasms., Disp: , Rfl:    topiramate (TOPAMAX) 200 MG tablet, Take 0.5 tablets (100 mg total) by mouth daily., Disp: 60 tablet, Rfl: 1   valACYclovir (VALTREX) 1000 MG tablet, Take 1,000 mg by mouth daily., Disp: , Rfl:    zolpidem (AMBIEN) 10 MG tablet, Take 10 mg by mouth at bedtime., Disp: , Rfl:  Erenumab-aooe (AIMOVIG) 140 MG/ML SOAJ, One pen subcutaneous every 4 weeks, Disp: 1 mL, Rfl: 12   fluconazole (DIFLUCAN) 150 MG tablet, One po prn fungal infection (Patient not taking: Reported on 05/29/2023), Disp: 4 tablet, Rfl: 0   modafinil (PROVIGIL) 100 MG tablet, Take 1 tablet (100 mg total) by mouth daily. (Patient not taking: Reported on 05/29/2023), Disp: 30 tablet, Rfl: 3   Vitamin D, Ergocalciferol, (DRISDOL) 1.25 MG (50000 UNIT) CAPS capsule, Take 50,000 Units by mouth every 7 (seven) days. (Patient not taking: Reported on 05/29/2023), Disp: , Rfl:   PAST MEDICAL HISTORY: Past Medical History:  Diagnosis Date   Abnormality of gait 11/25/2014   Chronic daily headache 2003   Love   Depression    Hypertension    Memory disorder 02/29/2016   MS (multiple sclerosis) (HCC) 1992   Dr. Sandria Manly   Multiple sclerosis (HCC) 11/21/2013   Neurogenic bladder 11/21/2013    PAST SURGICAL HISTORY: Past Surgical History:  Procedure Laterality Date   ABLATION COLPOCLESIS  2004   CESAREAN SECTION     TUBAL LIGATION       FAMILY HISTORY: Family History  Problem Relation Age of Onset   Heart failure Father    Multiple sclerosis Sister    Cancer Neg Hx    Diabetes Neg Hx    Hyperlipidemia Neg Hx    Hypertension Neg Hx    Kidney disease Neg Hx    Stroke Neg Hx     SOCIAL HISTORY:  Social History   Socioeconomic History   Marital status: Divorced    Spouse name: Not on file   Number of children: 2   Years of education: college   Highest education level: Not on file  Occupational History   Occupation: disability  Tobacco Use   Smoking status: Never   Smokeless tobacco: Never  Vaping Use   Vaping status: Never Used  Substance and Sexual Activity   Alcohol use: Yes    Comment: social   Drug use: No   Sexual activity: Yes    Birth control/protection: Surgical  Other Topics Concern   Not on file  Social History Narrative   Patient is right handed.   Patient drinks 2 cups of coffee daily.   Social Determinants of Health   Financial Resource Strain: Not on file  Food Insecurity: Not on file  Transportation Needs: Not on file  Physical Activity: Not on file  Stress: Not on file  Social Connections: Not on file  Intimate Partner Violence: Not on file     PHYSICAL EXAM  Vitals:   05/29/23 0925  BP: 134/81  Pulse: (!) 58  Weight: 207 lb (93.9 kg)  Height: 5\' 4"  (1.626 m)    Body mass index is 35.53 kg/m.   General: The patient is well-developed and well-nourished and in no acute distress  HEENT:  Head is [place]/AT.  Sclera are anicteric.      Skin: Extremities are without rash or  edema.  Neurologic Exam  Mental status: The patient is alert and oriented x 3 at the time of the examination. The patient has apparent normal recent and remote memory.  She was distractible with reduced focus/attention.Marland Kitchen   Speech is normal.  Cranial nerves: Extraocular movements are full.  Color vision is reduced on the right  Visual acuity was more symmetric.  Facial symmetry is present.  There is good facial sensation to soft touch bilaterally.Facial strength is normal.  Trapezius and sternocleidomastoid strength is normal. No dysarthria  is noted.   No obvious hearing deficits are noted.  Motor:  Muscle bulk is normal.   Tone is normal. Strength is  5 / 5 in all 4 extremities.   Sensory: Sensory testing is intact to pinprick, soft touch and vibration sensation in the arms.  She had reduced sensation to touch and vibration in the left leg.  Coordination: Cerebellar testing reveals good finger-nose-finger and heel-to-shin bilaterally.  Gait and station: Station is normal.   The gait is wide but she can walk without a cane in the office.  She is unable to do a tandem gait.  The Romberg was borderline positive.   Reflexes: Deep tendon reflexes are symmetric and normal bilaterally.  No ankle clonus     DIAGNOSTIC DATA (LABS, IMAGING, TESTING) - I reviewed patient records, labs, notes, testing and imaging myself where available.  Lab Results  Component Value Date   WBC 3.5 (L) 05/26/2023   HGB 12.4 05/26/2023   HCT 38.2 05/26/2023   MCV 94.1 05/26/2023   PLT 142 (L) 05/26/2023      Component Value Date/Time   NA 142 05/26/2023 1948   NA 145 (H) 09/11/2022 1529   NA 142 04/05/2015 1435   K 3.6 05/26/2023 1948   K 3.9 04/05/2015 1435   CL 109 05/26/2023 1948   CO2 22 05/26/2023 1948   CO2 24 04/05/2015 1435   GLUCOSE 89 05/26/2023 1948   GLUCOSE 89 04/05/2015 1435   BUN 18 05/26/2023 1948   BUN 22 09/11/2022 1529   BUN 18.2 04/05/2015 1435   CREATININE 0.98 05/26/2023 1948   CREATININE 0.96 12/11/2022 0000   CREATININE 1.1 04/05/2015 1435   CALCIUM 9.2 05/26/2023 1948   CALCIUM 9.3 04/05/2015 1435   PROT 7.2 12/11/2022 0000   PROT 6.7 09/11/2022 1529   PROT 6.9 04/05/2015 1435   ALBUMIN 4.8 09/11/2022 1529   ALBUMIN 4.2 04/05/2015 1435   AST 16 12/11/2022 0000   AST 27 04/05/2015 1435   ALT 15 12/11/2022 0000   ALT 46 04/05/2015 1435   ALKPHOS 53  09/11/2022 1529   ALKPHOS 53 04/05/2015 1435   BILITOT 0.3 12/11/2022 0000   BILITOT <0.2 09/11/2022 1529   BILITOT 0.22 04/05/2015 1435   GFRNONAA >60 05/26/2023 1948   GFRAA 88 06/01/2020 1112   Lab Results  Component Value Date   CHOL 214 (H) 12/11/2022   HDL 69 12/11/2022   LDLCALC 129 (H) 12/11/2022   TRIG 67 12/11/2022   CHOLHDL 3.1 12/11/2022   No results found for: "HGBA1C" Lab Results  Component Value Date   VITAMINB12 438 12/11/2022   Lab Results  Component Value Date   TSH 0.48 12/11/2022       ASSESSMENT AND PLAN  Relapsing remitting multiple sclerosis (HCC) - Plan: CBC with Differential/Platelet, Comprehensive metabolic panel  High risk medication use - Plan: CBC with Differential/Platelet, Comprehensive metabolic panel  Abnormality of gait  Cognitive deficit secondary to multiple sclerosis (HCC)  Neurogenic bladder  Encounter for therapeutic drug monitoring  Urinary urgency   She did well with the first year of Mavenclad.  She is 6 months out.  We will check blood work today.  I will see her back in 5 months and we will recheck the chronic infections as well as CBC with differential and CMP.. Cognition is affected from the MS.  This has led to disability.   Consider neurocognitive testing if continues to worsen.    Stay active and exercise as tolerated. Continue  anti-CGRP treatment along with Depakote for migraines. She needs a note for a ground-floor apartment.  Her gait is unstable and this is medically necessary..   RTC in 6  months but she should call sooner if new or worsening symptoms.  This visit is part of a comprehensive longitudinal care medical relationship regarding the patients primary diagnosis of MS and related concerns.   Talaysha Freeberg A. Epimenio Foot, MD, [facility] 05/29/2023, 10:36 AM Certified in Neurology, Clinical Neurophysiology, Sleep Medicine and Neuroimaging  [facility] [address], [address] [place], [place]  [address] [phone]

## 2023-05-30 LAB — CBC WITH DIFFERENTIAL/PLATELET
Basophils Absolute: 0 10*3/uL (ref 0.0–0.2)
Basos: 0 %
EOS (ABSOLUTE): 0 10*3/uL (ref 0.0–0.4)
Eos: 1 %
Hematocrit: 40.2 % (ref 34.0–46.6)
Hemoglobin: 12.7 g/dL (ref 11.1–15.9)
Immature Grans (Abs): 0 10*3/uL (ref 0.0–0.1)
Immature Granulocytes: 0 %
Lymphocytes Absolute: 1.1 10*3/uL (ref 0.7–3.1)
Lymphs: 39 %
MCH: 31 pg (ref 26.6–33.0)
MCHC: 31.6 g/dL (ref 31.5–35.7)
MCV: 98 fL — ABNORMAL HIGH (ref 79–97)
Monocytes Absolute: 0.2 10*3/uL (ref 0.1–0.9)
Monocytes: 9 %
Neutrophils Absolute: 1.4 10*3/uL (ref 1.4–7.0)
Neutrophils: 51 %
Platelets: 142 10*3/uL — ABNORMAL LOW (ref 150–450)
RBC: 4.1 x10E6/uL (ref 3.77–5.28)
RDW: 13.5 % (ref 11.7–15.4)
WBC: 2.8 10*3/uL — ABNORMAL LOW (ref 3.4–10.8)

## 2023-05-30 LAB — COMPREHENSIVE METABOLIC PANEL
ALT: 15 [IU]/L (ref 0–32)
AST: 21 [IU]/L (ref 0–40)
Albumin: 4.4 g/dL (ref 3.8–4.9)
Alkaline Phosphatase: 59 [IU]/L (ref 44–121)
BUN/Creatinine Ratio: 17 (ref 9–23)
BUN: 18 mg/dL (ref 6–24)
Bilirubin Total: <0.2 mg/dL (ref 0.0–1.2)
CO2: 24 mmol/L (ref 20–29)
Calcium: 8.9 mg/dL (ref 8.7–10.2)
Chloride: 108 mmol/L — ABNORMAL HIGH (ref 96–106)
Creatinine, Ser: 1.07 mg/dL — ABNORMAL HIGH (ref 0.57–1.00)
Globulin, Total: 2.1 g/dL (ref 1.5–4.5)
Glucose: 89 mg/dL (ref 70–99)
Potassium: 3.9 mmol/L (ref 3.5–5.2)
Sodium: 145 mmol/L — ABNORMAL HIGH (ref 134–144)
Total Protein: 6.5 g/dL (ref 6.0–8.5)
eGFR: 62 mL/min/{1.73_m2} (ref >59)

## 2023-06-11 ENCOUNTER — Telehealth: Payer: Self-pay | Admitting: Neurology

## 2023-06-11 NOTE — Telephone Encounter (Signed)
LVM and sent mychart msg informing pt of need to reschedule 10/09/23 appt - MD out

## 2023-06-13 ENCOUNTER — Other Ambulatory Visit: Payer: Self-pay | Admitting: Urology

## 2023-06-19 NOTE — Progress Notes (Signed)
COVID Vaccine received:  []  No [x]  Yes Date of any COVID positive Test in last 90 days:  None  PCP - Salli Real, MD Cardiologist - None Neurology- Despina Arias, MD  Chest x-ray -  EKG -  03-24-2016 epic   Will repeat at PST Stress Test -  ECHO -  Cardiac Cath -   PCR screen: []  Ordered & Completed []   No Order but Needs PROFEND     [x]   N/A for this surgery  Surgery Plan:  [x]  Ambulatory   []  Outpatient in bed  []  Admit Anesthesia:    [x]  General  []  Spinal  []   Choice []   MAC  Bowel Prep - [x]  No  []   Yes ______  Pacemaker / ICD device [x]  No []  Yes   Spinal Cord Stimulator:[x]  No []  Yes       History of Sleep Apnea? []  No [x]  Yes   CPAP used?- [x]  No []  Yes    Does the patient monitor blood sugar?   [x]  N/A   []  No []  Yes  Patient has: [x]  NO Hx DM   []  Pre-DM   []  DM1  []   DM2  Blood Thinner / Instructions: none Aspirin Instructions:  none  ERAS Protocol Ordered: [x]  No  []  Yes Patient is to be NPO after: Midnight prior  Dental hx: []  Dentures:  [x]  N/A      []  Bridge or Partial:                   []  Loose or Damaged teeth:   Comments: Patient instructed that she needs to stop taking Phentermine x 2 weeks prior to surgery. She voiced understanding.   Can go up a flight of stairs and activities of daily living without stopping and without chest pain and/or shortness of breath.  Able to exercise without chest pain and/or shortness of breath  Anesthesia review: current Phentermine, HTN, RR-MS w/ memory loss, Depression  Patient denies shortness of breath, fever, cough and chest pain at PAT appointment.  Patient verbalized understanding and agreement to the Pre-Surgical Instructions that were given to them at this PAT appointment. Patient was also educated of the need to review these PAT instructions again prior to her surgery.I reviewed the appropriate phone numbers to call if they have any and questions or concerns.

## 2023-06-19 NOTE — Patient Instructions (Signed)
SURGICAL WAITING ROOM VISITATION Patients having surgery or a procedure may have no more than 2 support people in the waiting area - these visitors may rotate in the visitor waiting room.   Due to an increase in RSV and influenza rates and associated hospitalizations, children ages 54 and under may not visit patients in [facility] hospitals. If the patient needs to stay at the hospital during part of their recovery, the visitor guidelines for inpatient rooms apply.  PRE-OP VISITATION  Pre-op nurse will coordinate an appropriate time for 1 support person to accompany the patient in pre-op.  This support person may not rotate.  This visitor will be contacted when the time is appropriate for the visitor to come back in the pre-op area.  Please refer to the [facility] website for the visitor guidelines for Inpatients (after your surgery is over and you are in a regular room).  You are not required to quarantine at this time prior to your surgery. However, you must do this: Hand Hygiene often Do NOT share personal items Notify your provider if you are in close contact with someone who has COVID or you develop fever 100.4 or greater, new onset of sneezing, cough, sore throat, shortness of breath or body aches.  If you test positive for Covid or have been in contact with anyone that has tested positive in the last 10 days please notify you surgeon.    Your procedure is scheduled on:  Monday July 09, 2023  Call this number if you have any questions or problems the morning of surgery [phone]  DO NOT EAT OR DRINK ANYTHING AFTER MIDNIGHT THE NIGHT PRIOR TO YOUR SURGERY / PROCEDURE.   FOLLOW  ANY ADDITIONAL PRE OP INSTRUCTIONS YOU RECEIVED FROM YOUR SURGEON'S OFFICE!!!   Oral Hygiene is also important to reduce your risk of infection.        Remember - BRUSH YOUR TEETH THE MORNING OF SURGERY WITH YOUR REGULAR TOOTHPASTE  Do NOT smoke after Midnight the night before surgery.  Phentermine-  DO NOT take for 2 weeks prior to your surgery. Last dose: Monday 06-25-2023  STOP TAKING all Vitamins, Herbs and supplements 1 week before your surgery.   Take ONLY these medicines the morning of surgery with A SIP OF WATER: Mirabegron (Myrbetriq), divalproex (Depakote).  You may use Tylenol if needed for pain.                   You may not have any metal on your body including hair pins, jewelry, and body piercing  Do not wear make-up, lotions, powders, perfumes  or deodorant  Do not wear nail polish including gel and S&S, artificial / acrylic nails, or any other type of covering on natural nails including finger and toenails. If you have artificial nails, gel coating, etc., that needs to be removed by a nail salon, Please have this removed prior to surgery. Not doing so may mean that your surgery could be cancelled or delayed if the Surgeon or anesthesia staff feels like they are unable to monitor you safely.   Do not shave 48 hours prior to surgery to avoid nicks in your skin which may contribute to postoperative infections.   Contacts, Hearing Aids, dentures or bridgework may not be worn into surgery. DENTURES WILL BE REMOVED PRIOR TO SURGERY PLEASE DO NOT APPLY "Poly grip" OR ADHESIVES!!!  Patients discharged on the day of surgery will not be allowed to drive home.  Someone NEEDS to stay  with you for the first 24 hours after anesthesia.  Do not bring your home medications to the hospital. The Pharmacy will dispense medications listed on your medication list to you during your admission in the Hospital.  Please read over the following fact sheets you were given: IF YOU HAVE QUESTIONS ABOUT YOUR PRE-OP INSTRUCTIONS, PLEASE CALL [phone].   [place] - Preparing for Surgery Before surgery, you can play an important role.  Because skin is not sterile, your skin needs to be as free of germs as possible.  You can reduce the number of germs on your skin by washing with CHG (chlorahexidine  gluconate) soap before surgery.  CHG is an antiseptic cleaner which kills germs and bonds with the skin to continue killing germs even after washing. Please DO NOT use if you have an allergy to CHG or antibacterial soaps.  If your skin becomes reddened/irritated stop using the CHG and inform your nurse when you arrive at Short Stay. Do not shave (including legs and underarms) for at least 48 hours prior to the first CHG shower.  You may shave your face/neck.  Please follow these instructions carefully:  1.  Shower with CHG Soap the night before surgery and the  morning of surgery.  2.  If you choose to wash your hair, wash your hair first as usual with your normal  shampoo.  3.  After you shampoo, rinse your hair and body thoroughly to remove the shampoo.                             4.  Use CHG as you would any other liquid soap.  You can apply chg directly to the skin and wash.  Gently with a scrungie or clean washcloth.  5.  Apply the CHG Soap to your body ONLY FROM THE NECK DOWN.   Do not use on face/ open                           Wound or open sores. Avoid contact with eyes, ears mouth and genitals (private parts).                       Wash face,  Genitals (private parts) with your normal soap.             6.  Wash thoroughly, paying special attention to the area where your  surgery  will be performed.  7.  Thoroughly rinse your body with warm water from the neck down.  8.  DO NOT shower/wash with your normal soap after using and rinsing off the CHG Soap.            9.  Pat yourself dry with a clean towel.            10.  Wear clean pajamas.            11.  Place clean sheets on your bed the night of your first shower and do not  sleep with pets.  ON THE DAY OF SURGERY : Do not apply any lotions/deodorants the morning of surgery.  Please wear clean clothes to the hospital/surgery center.    FAILURE TO FOLLOW THESE INSTRUCTIONS MAY RESULT IN THE CANCELLATION OF YOUR SURGERY  PATIENT  SIGNATURE_________________________________  NURSE SIGNATURE__________________________________  ________________________________________________________________________

## 2023-06-20 ENCOUNTER — Encounter (HOSPITAL_COMMUNITY)
Admission: RE | Admit: 2023-06-20 | Discharge: 2023-06-20 | Disposition: A | Discharge status: Home / Self Care | Payer: 59 | Source: Ambulatory Visit | Attending: Urology | Admitting: Urology

## 2023-06-20 ENCOUNTER — Encounter (HOSPITAL_COMMUNITY): Payer: Self-pay | Admitting: *Deleted

## 2023-06-20 ENCOUNTER — Other Ambulatory Visit: Payer: Self-pay

## 2023-06-20 VITALS — BP 138/80 | HR 60 | Temp 98.7°F | Resp 20 | Ht 64.0 in | Wt 197.0 lb

## 2023-06-20 DIAGNOSIS — Z79899 Other long term (current) drug therapy: Secondary | ICD-10-CM | POA: Diagnosis not present

## 2023-06-20 DIAGNOSIS — Z01810 Encounter for preprocedural cardiovascular examination: Secondary | ICD-10-CM | POA: Diagnosis present

## 2023-06-20 DIAGNOSIS — R9431 Abnormal electrocardiogram [ECG] [EKG]: Secondary | ICD-10-CM | POA: Insufficient documentation

## 2023-06-20 DIAGNOSIS — Z01818 Encounter for other preprocedural examination: Secondary | ICD-10-CM | POA: Insufficient documentation

## 2023-06-20 DIAGNOSIS — Z01812 Encounter for preprocedural laboratory examination: Secondary | ICD-10-CM | POA: Diagnosis present

## 2023-06-20 DIAGNOSIS — I10 Essential (primary) hypertension: Secondary | ICD-10-CM | POA: Insufficient documentation

## 2023-06-20 LAB — COMPREHENSIVE METABOLIC PANEL
ALT: 31 U/L (ref 0–44)
AST: 32 U/L (ref 15–41)
Albumin: 4.2 g/dL (ref 3.5–5.0)
Alkaline Phosphatase: 55 U/L (ref 38–126)
Anion gap: 10 (ref 5–15)
BUN: 19 mg/dL (ref 6–20)
CO2: 24 mmol/L (ref 22–32)
Calcium: 9.3 mg/dL (ref 8.9–10.3)
Chloride: 108 mmol/L (ref 98–111)
Creatinine, Ser: 1.03 mg/dL — ABNORMAL HIGH (ref 0.44–1.00)
GFR, Estimated: >60 mL/min (ref >60)
Glucose, Bld: 86 mg/dL (ref 70–99)
Potassium: 3.9 mmol/L (ref 3.5–5.1)
Sodium: 142 mmol/L (ref 135–145)
Total Bilirubin: 0.6 mg/dL (ref <1.2)
Total Protein: 6.7 g/dL (ref 6.5–8.1)

## 2023-06-20 LAB — CBC
HCT: 38.5 % (ref 36.0–46.0)
Hemoglobin: 12.5 g/dL (ref 12.0–15.0)
MCH: 31.3 pg (ref 26.0–34.0)
MCHC: 32.5 g/dL (ref 30.0–36.0)
MCV: 96.5 fL (ref 80.0–100.0)
Platelets: 143 10*3/uL — ABNORMAL LOW (ref 150–400)
RBC: 3.99 MIL/uL (ref 3.87–5.11)
RDW: 13.5 % (ref 11.5–15.5)
WBC: 2.6 10*3/uL — ABNORMAL LOW (ref 4.0–10.5)
nRBC: 0 % (ref 0.0–0.2)

## 2023-06-22 ENCOUNTER — Encounter (HOSPITAL_COMMUNITY): Payer: Self-pay | Admitting: Physician Assistant

## 2023-06-25 ENCOUNTER — Encounter (HOSPITAL_COMMUNITY): Payer: Self-pay

## 2023-07-04 ENCOUNTER — Other Ambulatory Visit: Payer: Self-pay | Admitting: Neurology

## 2023-07-04 DIAGNOSIS — G35 Multiple sclerosis: Secondary | ICD-10-CM

## 2023-07-04 DIAGNOSIS — N319 Neuromuscular dysfunction of bladder, unspecified: Secondary | ICD-10-CM

## 2023-07-07 NOTE — Anesthesia Preprocedure Evaluation (Signed)
Anesthesia Evaluation    Reviewed: Allergy & Precautions, Patient's Chart, lab work & pertinent test results  Airway        Dental   Pulmonary neg pulmonary ROS          Cardiovascular hypertension, Pt. on medications      Neuro/Psych  Headaches PSYCHIATRIC DISORDERS  Depression    MS (multiple sclerosis)    GI/Hepatic negative GI ROS, Neg liver ROS,,,  Endo/Other  negative endocrine ROS    Renal/GU negative Renal ROS     Musculoskeletal negative musculoskeletal ROS (+)    Abdominal  (+) + obese  Peds  Hematology negative hematology ROS (+)   Anesthesia Other Findings DETRUSOR OVERACTIVITY  Reproductive/Obstetrics                             Anesthesia Physical Anesthesia Plan Anesthesia Quick Evaluation

## 2023-07-09 ENCOUNTER — Encounter (HOSPITAL_COMMUNITY): Admission: RE | Payer: Self-pay | Source: Home / Self Care

## 2023-07-09 ENCOUNTER — Ambulatory Visit (HOSPITAL_COMMUNITY): Admission: RE | Admit: 2023-07-09 | Payer: 59 | Source: Home / Self Care | Admitting: Urology

## 2023-07-09 SURGERY — BOTOX INJECTION
Anesthesia: General

## 2023-07-09 MED ORDER — FENTANYL CITRATE (PF) 100 MCG/2ML IJ SOLN
INTRAMUSCULAR | Status: AC
  Filled 2023-07-09: qty 2

## 2023-07-09 MED ORDER — ONDANSETRON HCL 4 MG/2ML IJ SOLN
INTRAMUSCULAR | Status: AC
  Filled 2023-07-09: qty 2

## 2023-07-09 MED ORDER — PROPOFOL 10 MG/ML IV BOLUS
INTRAVENOUS | Status: AC
Start: 2023-07-09 — End: ?
  Filled 2023-07-09: qty 20

## 2023-07-09 MED ORDER — LIDOCAINE HCL (PF) 2 % IJ SOLN
INTRAMUSCULAR | Status: AC
  Filled 2023-07-09: qty 5

## 2023-07-09 MED ORDER — DEXAMETHASONE SODIUM PHOSPHATE 10 MG/ML IJ SOLN
INTRAMUSCULAR | Status: AC
  Filled 2023-07-09: qty 1

## 2023-07-09 MED ORDER — PROPOFOL 10 MG/ML IV BOLUS
INTRAVENOUS | Status: AC
  Filled 2023-07-09: qty 20

## 2023-07-09 NOTE — Progress Notes (Addendum)
Attempted to reach patient via phone.  Patient had not arrived yet for surgery.  Patient was supposed to arrive at 0630.  Or desk notifed   Surgery cancelled per daughter.

## 2023-07-10 NOTE — Progress Notes (Signed)
Attempted to obtain medical history via telephone, unable to reach at this time. HIPAA compliant voicemail message left requesting return call to pre surgical testing department. 

## 2023-07-11 NOTE — Progress Notes (Signed)
Updated date of surgery:07-13-23  Updated time of arrival:1050 am  Patient will be discharged from hospital and monitored at home for 24 hours by:No changes  Patient denies any changes in allergies, medications, medical history since pre op appointment on:  Pre op instructions reviewed, follow up questions addressed and patient verbalized understanding at this time.

## 2023-07-13 ENCOUNTER — Other Ambulatory Visit: Payer: Self-pay

## 2023-07-13 ENCOUNTER — Ambulatory Visit (HOSPITAL_COMMUNITY)
Admission: RE | Admit: 2023-07-13 | Discharge: 2023-07-13 | Disposition: A | Discharge status: Home / Self Care | Payer: 59 | Attending: Urology | Admitting: Urology

## 2023-07-13 ENCOUNTER — Ambulatory Visit (HOSPITAL_COMMUNITY): Payer: 59 | Admitting: Anesthesiology

## 2023-07-13 ENCOUNTER — Encounter (HOSPITAL_COMMUNITY)
Admission: RE | Disposition: A | Discharge status: Home / Self Care | Payer: Self-pay | Source: Home / Self Care | Attending: Urology

## 2023-07-13 ENCOUNTER — Encounter (HOSPITAL_COMMUNITY): Payer: Self-pay | Admitting: Urology

## 2023-07-13 DIAGNOSIS — Z79899 Other long term (current) drug therapy: Secondary | ICD-10-CM | POA: Insufficient documentation

## 2023-07-13 DIAGNOSIS — N3941 Urge incontinence: Secondary | ICD-10-CM

## 2023-07-13 DIAGNOSIS — Z8249 Family history of ischemic heart disease and other diseases of the circulatory system: Secondary | ICD-10-CM | POA: Insufficient documentation

## 2023-07-13 DIAGNOSIS — I10 Essential (primary) hypertension: Secondary | ICD-10-CM | POA: Insufficient documentation

## 2023-07-13 DIAGNOSIS — N319 Neuromuscular dysfunction of bladder, unspecified: Secondary | ICD-10-CM | POA: Insufficient documentation

## 2023-07-13 DIAGNOSIS — G35 Multiple sclerosis: Secondary | ICD-10-CM | POA: Insufficient documentation

## 2023-07-13 HISTORY — PX: BOTOX INJECTION: SHX5754

## 2023-07-13 SURGERY — BOTOX INJECTION
Anesthesia: General

## 2023-07-13 MED ORDER — OXYCODONE HCL 5 MG PO TABS: 5.0000 mg | ORAL_TABLET | Freq: Once | ORAL | Status: DC | PRN

## 2023-07-13 MED ORDER — ONABOTULINUMTOXINA 100 UNITS IJ SOLR
INTRAMUSCULAR | Status: DC | PRN
  Administered 2023-07-13: 100 [IU] via INTRAMUSCULAR

## 2023-07-13 MED ORDER — MEPERIDINE HCL 50 MG/ML IJ SOLN: 6.2500 mg | INTRAMUSCULAR | Status: DC | PRN

## 2023-07-13 MED ORDER — ONDANSETRON HCL 4 MG/2ML IJ SOLN
INTRAMUSCULAR | Status: AC
  Filled 2023-07-13: qty 2

## 2023-07-13 MED ORDER — LACTATED RINGERS IV SOLN: INTRAVENOUS | Status: DC

## 2023-07-13 MED ORDER — ONDANSETRON HCL 4 MG/2ML IJ SOLN
INTRAMUSCULAR | Status: DC | PRN
  Administered 2023-07-13: 4 mg via INTRAVENOUS

## 2023-07-13 MED ORDER — BUPIVACAINE LIPOSOME 1.3 % IJ SUSP
INTRAMUSCULAR | Status: AC
  Filled 2023-07-13: qty 20

## 2023-07-13 MED ORDER — FENTANYL CITRATE (PF) 100 MCG/2ML IJ SOLN
INTRAMUSCULAR | Status: AC
  Filled 2023-07-13: qty 2

## 2023-07-13 MED ORDER — CHLORHEXIDINE GLUCONATE 0.12 % MT SOLN
15.0000 mL | Freq: Once | OROMUCOSAL | Status: AC
  Administered 2023-07-13: 15 mL via OROMUCOSAL

## 2023-07-13 MED ORDER — ONABOTULINUMTOXINA 100 UNITS IJ SOLR
INTRAMUSCULAR | Status: AC
  Filled 2023-07-13: qty 200

## 2023-07-13 MED ORDER — MIDAZOLAM HCL 2 MG/2ML IJ SOLN: 0.5000 mg | Freq: Once | INTRAMUSCULAR | Status: DC | PRN

## 2023-07-13 MED ORDER — ORAL CARE MOUTH RINSE: 15.0000 mL | Freq: Once | OROMUCOSAL | Status: AC

## 2023-07-13 MED ORDER — FENTANYL CITRATE (PF) 100 MCG/2ML IJ SOLN
INTRAMUSCULAR | Status: DC | PRN
  Administered 2023-07-13: 50 ug via INTRAVENOUS

## 2023-07-13 MED ORDER — MIDAZOLAM HCL 5 MG/5ML IJ SOLN
INTRAMUSCULAR | Status: DC | PRN
  Administered 2023-07-13: 2 mg via INTRAVENOUS

## 2023-07-13 MED ORDER — SODIUM CHLORIDE (PF) 0.9 % IJ SOLN
INTRAMUSCULAR | Status: DC | PRN
  Administered 2023-07-13: 10 mL

## 2023-07-13 MED ORDER — LIDOCAINE HCL (CARDIAC) PF 100 MG/5ML IV SOSY
PREFILLED_SYRINGE | INTRAVENOUS | Status: DC | PRN
  Administered 2023-07-13: 20 mg via INTRAVENOUS

## 2023-07-13 MED ORDER — DEXAMETHASONE SODIUM PHOSPHATE 4 MG/ML IJ SOLN
INTRAMUSCULAR | Status: DC | PRN
  Administered 2023-07-13: 8 mg via INTRAVENOUS

## 2023-07-13 MED ORDER — MIDAZOLAM HCL 2 MG/2ML IJ SOLN
INTRAMUSCULAR | Status: AC
  Filled 2023-07-13: qty 2

## 2023-07-13 MED ORDER — OXYCODONE HCL 5 MG/5ML PO SOLN: 5.0000 mg | Freq: Once | ORAL | Status: DC | PRN

## 2023-07-13 MED ORDER — PROPOFOL 10 MG/ML IV BOLUS
INTRAVENOUS | Status: AC
  Filled 2023-07-13: qty 20

## 2023-07-13 MED ORDER — DEXAMETHASONE SODIUM PHOSPHATE 10 MG/ML IJ SOLN
INTRAMUSCULAR | Status: AC
  Filled 2023-07-13: qty 1

## 2023-07-13 MED ORDER — SODIUM CHLORIDE 0.9 % IR SOLN
Status: DC | PRN
  Administered 2023-07-13: 1000 mL via INTRAVESICAL

## 2023-07-13 MED ORDER — SODIUM CHLORIDE (PF) 0.9 % IJ SOLN
INTRAMUSCULAR | Status: AC
  Filled 2023-07-13: qty 10

## 2023-07-13 MED ORDER — CEFAZOLIN SODIUM-DEXTROSE 2-4 GM/100ML-% IV SOLN
2.0000 g | INTRAVENOUS | Status: AC
  Administered 2023-07-13: 2 g via INTRAVENOUS
  Filled 2023-07-13: qty 100

## 2023-07-13 MED ORDER — FENTANYL CITRATE PF 50 MCG/ML IJ SOSY
25.0000 ug | PREFILLED_SYRINGE | INTRAMUSCULAR | Status: DC | PRN
Start: 2023-07-13 — End: 2023-07-13

## 2023-07-13 MED ORDER — PROPOFOL 10 MG/ML IV BOLUS
INTRAVENOUS | Status: DC | PRN
  Administered 2023-07-13: 200 mg via INTRAVENOUS

## 2023-07-13 MED ORDER — ACETAMINOPHEN 500 MG PO TABS
1000.0000 mg | ORAL_TABLET | Freq: Once | ORAL | Status: AC
  Administered 2023-07-13: 1000 mg via ORAL
  Filled 2023-07-13: qty 2

## 2023-07-13 SURGICAL SUPPLY — 15 items
BAG URO CATCHER STRL LF (MISCELLANEOUS) ×1 IMPLANT
CLOTH BEACON ORANGE TIMEOUT ST (SAFETY) ×1 IMPLANT
GLOVE BIO SURGEON STRL SZ7.5 (GLOVE) ×1 IMPLANT
GOWN STRL REUS W/ TWL XL LVL3 (GOWN DISPOSABLE) ×2 IMPLANT
KIT TURNOVER KIT A (KITS) IMPLANT
MANIFOLD NEPTUNE II (INSTRUMENTS) ×1 IMPLANT
NDL ASPIRATION 22 (NEEDLE) ×1 IMPLANT
NDL SAFETY ECLIPSE 18X1.5 (NEEDLE) IMPLANT
NEEDLE ASPIRATION 22 (NEEDLE) ×1
PACK CYSTO (CUSTOM PROCEDURE TRAY) ×1 IMPLANT
SYR 10ML LL (SYRINGE) IMPLANT
SYR 3ML 18GX1 1/2 (SYRINGE) IMPLANT
SYR CONTROL 10ML LL (SYRINGE) IMPLANT
TUBING CONNECTING 10 (TUBING) IMPLANT
WATER STERILE IRR 3000ML UROMA (IV SOLUTION) ×1 IMPLANT

## 2023-07-13 NOTE — H&P (Signed)
H&P  CC/HPI: cc: Urinary incontinence  HPI:  02/22/2023  52 year old female with a history of MS with neurogenic bladder secondary to relapsing/remitting MS. Diagnosed at an early age of 59. Was seen about 10 years ago. Previous urodynamics showed a small capacity bladder with detrusor instability and some DSD. She was treated at that time with Toviaz and Myrbetriq. Continues on Myrbetriq now. Despite this continues to have urgency with associated urge urinary incontinence that is quite troublesome to her. Affects her quality of life. Wants to get into a relationship and this is troublesome to her.   05/09/2023  Patient underwent a urodynamics that revealed the following:  UDS SUMMARY  Ms. Roblez held a max capacity of approx. 258 mls. Her 1st sensation was felt at 235 mls. No SUI noted. There was positive instability. Multiple unstable contractions noted on the study today. Some of these contractions were triggered from Valsalva. She leaked severely off this bladder instability. She was able to generate a voluntary contraction and void 103 mls with max flow of 18 ml/s. Max detrusor pressure while voiding was 83 cmH20. EMG leads were basically quiet during the voiding phase. PVR was approx. 149 mls. During cough and Valsalva, the bladder descended approximately 0-1 cm. No trabeculation was noted. No reflux was seen. She will return for UDS follow up.   She greatly desires to proceed with intradetrusor Botox.    07/13/23 Patient presents for cysto w/ botox today.  Past Medical History:  Diagnosis Date   Abnormality of gait 11/25/2014   Chronic daily headache 2003   Love   Depression    Hypertension    Memory disorder 02/29/2016   MS (multiple sclerosis) (HCC) 1992   Dr. Sandria Manly   Neurogenic bladder 11/21/2013   Past Surgical History:  Procedure Laterality Date   ABLATION COLPOCLESIS  08/07/2002   CESAREAN SECTION     x 2   TUBAL LIGATION      Home Medications:  Medications Prior to  Admission  Medication Sig Dispense Refill Last Dose   acetaminophen (TYLENOL) 500 MG tablet Take 1,000 mg by mouth every 6 (six) hours as needed for moderate pain (pain score 4-6).   Past Week   divalproex (DEPAKOTE) 500 MG DR tablet Take 1 tablet (500 mg total) by mouth 2 (two) times daily. 60 tablet 5 07/12/2023   Erenumab-aooe (AIMOVIG) 140 MG/ML SOAJ One pen subcutaneous every 4 weeks 1 mL 12 Past Month   estrogen, conjugated,-medroxyprogesterone (PREMPRO) 0.625-2.5 MG tablet Take 1 tablet by mouth daily.   07/12/2023   Ibuprofen-diphenhydrAMINE HCl (ADVIL PM) 200-25 MG CAPS Take 2 tablets by mouth at bedtime as needed (sleep).   Past Week   lisinopril-hydrochlorothiazide (ZESTORETIC) 10-12.5 MG tablet Take 1 tablet by mouth daily.   07/12/2023   MYRBETRIQ 50 MG TB24 tablet TAKE 1 TABLET(50 MG) BY MOUTH DAILY 90 tablet 0 07/13/2023 at 0730   tiZANidine (ZANAFLEX) 4 MG tablet Take 4 mg by mouth 2 (two) times daily as needed for muscle spasms.   07/12/2023   topiramate (TOPAMAX) 200 MG tablet Take 0.5 tablets (100 mg total) by mouth daily. (Patient taking differently: Take 200 mg by mouth at bedtime.) 60 tablet 1 07/12/2023   zolpidem (AMBIEN) 10 MG tablet Take 10 mg by mouth at bedtime as needed for sleep.   07/12/2023   Cladribine, 9 Tabs, (MAVENCLAD, 9 TABS,) 10 MG TBPK Take by mouth. Year 1 month 1: 10/24/22-10/28/22 Take 20 mg by mouth once daily on days 1-4. 10mg   on day 5. (Month 1) Take 20mg  by mouth once daily on days 1-3. 10mg  days 4-5. (Month 2)      phentermine (ADIPEX-P) 37.5 MG tablet Take 37.5 mg by mouth daily as needed (exercise).   07/06/2023   Allergies: No Known Allergies  Family History  Problem Relation Age of Onset   Heart failure Father    Multiple sclerosis Sister    Cancer Neg Hx    Diabetes Neg Hx    Hyperlipidemia Neg Hx    Hypertension Neg Hx    Kidney disease Neg Hx    Stroke Neg Hx    Social History:  reports that she has never smoked. She has never used  smokeless tobacco. She reports current alcohol use. She reports that she does not use drugs.  ROS: A complete review of systems was performed.  All systems are negative except for pertinent findings as noted. ROS   Physical Exam:  Vital signs in last 24 hours: Temp:  [98.5 F (36.9 C)] 98.5 F (36.9 C) (12/06 1147) Resp:  [18] 18 (12/06 1147) BP: (138)/(87) 138/87 (12/06 1147) SpO2:  [100 %] 100 % (12/06 1147) Weight:  [93.9 kg] 93.9 kg (12/06 1147) General:  Alert and oriented, No acute distress HEENT: Normocephalic, atraumatic Neck: No JVD or lymphadenopathy Cardiovascular: Regular rate and rhythm Lungs: Regular rate and effort Abdomen: Soft, nontender, nondistended, no abdominal masses Back: No CVA tenderness Extremities: No edema Neurologic: Grossly intact  Laboratory Data:  No results found for this or any previous visit (from the past 24 hour(s)). No results found for this or any previous visit (from the past 240 hour(s)). Creatinine: No results for input(s): "CREATININE" in the last 168 hours.  Impression/Assessment:  Detrusor overactivity Urge incontinence  Plan:  She would like proceed with intradetrusor Botox. She has failed oral medical management with Gala Murdoch and Myrbetriq. We taught her CIC in the office. She understands potential need for CIC especially given her history of MS.  Ray Church, III 07/13/2023, 12:39 PM

## 2023-07-13 NOTE — Anesthesia Procedure Notes (Signed)
Procedure Name: LMA Insertion Date/Time: 07/13/2023 2:04 PM  Performed by: Lily Lovings, CRNAPre-anesthesia Checklist: Patient identified, Patient being monitored, Timeout performed, Emergency Drugs available and Suction available Patient Re-evaluated:Patient Re-evaluated prior to induction Oxygen Delivery Method: Circle system utilized Preoxygenation: Pre-oxygenation with 100% oxygen Induction Type: IV induction LMA: LMA inserted LMA Size: 4.0 Tube type: Oral Number of attempts: 1 Placement Confirmation: positive ETCO2 and breath sounds checked- equal and bilateral Tube secured with: Tape Dental Injury: Teeth and Oropharynx as per pre-operative assessment

## 2023-07-13 NOTE — Transfer of Care (Signed)
Immediate Anesthesia Transfer of Care Note  Patient: Connie Robertson  Procedure(s) Performed: CYSTOSCOPY BOTOX INJECTION  Patient Location: PACU  Anesthesia Type:General  Level of Consciousness: drowsy and patient cooperative  Airway & Oxygen Therapy: Patient Spontanous Breathing and Patient connected to nasal cannula oxygen  Post-op Assessment: Report given to RN and Patient moving all extremities  Post vital signs: Reviewed and stable  Last Vitals:  Vitals Value Taken Time  BP 123/84 07/13/23 1434  Temp 36.4 C 07/13/23 1434  Pulse 62 07/13/23 1434  Resp 12 07/13/23 1434  SpO2 100 % 07/13/23 1434    Last Pain:  Vitals:   07/13/23 1433  TempSrc: Oral  PainSc: 0-No pain         Complications: No notable events documented.

## 2023-07-13 NOTE — Anesthesia Postprocedure Evaluation (Signed)
Anesthesia Post Note  Patient: Connie Robertson  Procedure(s) Performed: CYSTOSCOPY BOTOX INJECTION     Patient location during evaluation: PACU Anesthesia Type: General Level of consciousness: awake and alert Pain management: pain level controlled Vital Signs Assessment: post-procedure vital signs reviewed and stable Respiratory status: spontaneous breathing, nonlabored ventilation, respiratory function stable and patient connected to nasal cannula oxygen Cardiovascular status: blood pressure returned to baseline and stable Postop Assessment: no apparent nausea or vomiting Anesthetic complications: no  No notable events documented.  Last Vitals:  Vitals:   07/13/23 1515 07/13/23 1526  BP: 126/66 (!) 142/89  Pulse: (!) 54   Resp: 14 14  Temp:    SpO2: 100%     Last Pain:  Vitals:   07/13/23 1526  TempSrc:   PainSc: 0-No pain                 Kennieth Rad

## 2023-07-13 NOTE — Anesthesia Preprocedure Evaluation (Signed)
Anesthesia Evaluation  Patient identified by MRN, date of birth, ID band Patient awake    Reviewed: Allergy & Precautions, NPO status , Patient's Chart, lab work & pertinent test results  Airway Mallampati: II  TM Distance: >3 FB Neck ROM: Full    Dental  (+) Dental Advisory Given   Pulmonary neg pulmonary ROS   breath sounds clear to auscultation       Cardiovascular hypertension, Pt. on medications  Rhythm:Regular Rate:Normal     Neuro/Psych  Headaches  Neuromuscular disease (MS)    GI/Hepatic negative GI ROS, Neg liver ROS,,,  Endo/Other  negative endocrine ROS    Renal/GU negative Renal ROS     Musculoskeletal   Abdominal   Peds  Hematology negative hematology ROS (+)   Anesthesia Other Findings   Reproductive/Obstetrics                             Anesthesia Physical Anesthesia Plan  ASA: 2  Anesthesia Plan: General   Post-op Pain Management: Tylenol PO (pre-op)*   Induction: Intravenous  PONV Risk Score and Plan: 3 and Dexamethasone, Ondansetron, Midazolam and Treatment may vary due to age or medical condition  Airway Management Planned: LMA  Additional Equipment:   Intra-op Plan:   Post-operative Plan: Extubation in OR  Informed Consent: I have reviewed the patients History and Physical, chart, labs and discussed the procedure including the risks, benefits and alternatives for the proposed anesthesia with the patient or authorized representative who has indicated his/her understanding and acceptance.     Dental advisory given  Plan Discussed with: CRNA  Anesthesia Plan Comments:        Anesthesia Quick Evaluation

## 2023-07-13 NOTE — Discharge Instructions (Signed)
You may notice some blood in the urine.  This is normal as long as you are able to urinate.  If you are unable to urinate, go to the emergency department.  If our office is open, call the office.  Call or go to the emergency room for fever over 101.5.

## 2023-07-13 NOTE — Op Note (Signed)
Operative Note  Preoperative diagnosis:  1.  Urge incontinence with detrusor overactivity  Postoperative diagnosis: 1.  Urge incontinence with detrusor overactivity  Procedure(s): 1.  Cystoscopy with 100 units of intra detrusor Botox  Surgeon: Modena Slater, MD  Assistants: None  Anesthesia: General  Complications: None immediate  EBL: Minimal  Specimens: 1.  None  Drains/Catheters: 1.  None  Intraoperative findings: Normal urethra and bladder  Indication: 52 year old female with MS and detrusor overactivity with urge urinary incontinence presents for Botox.  Description of procedure:  The patient was identified and consent was obtained.  The patient was taken to the operating room and placed in the supine position.  The patient was placed under general anesthesia.  Perioperative antibiotics were administered.  The patient was placed in dorsal lithotomy.  Patient was prepped and draped in a standard sterile fashion and a timeout was performed.  A rigid cystoscope was advanced into the urethra and into the bladder.  Complete cystoscopy was performed with no abnormal findings.  100 units of Botox was then systematically instilled into the detrusor through the needle throughout the bladder.  There was no significant active bleeding from the injection sites.  I drained the bladder withdrew the scope.  Patient tolerated the procedure well was stable postoperatively.  Plan: Follow-up in a couple of weeks for symptomatic review and PVR.  If she does well with this we will repeat the Botox in 6 months.

## 2023-07-14 ENCOUNTER — Encounter (HOSPITAL_COMMUNITY): Payer: Self-pay | Admitting: Urology

## 2023-08-29 ENCOUNTER — Telehealth: Payer: Self-pay

## 2023-08-29 NOTE — Telephone Encounter (Signed)
Called pt and got her rescheduled for Appointment on 10/10/2023 at 9am with Dr. Epimenio Foot so she can get her lab work as well as get her paperwork signed to start her 2nd year of [employer].

## 2023-09-11 ENCOUNTER — Telehealth: Payer: Self-pay | Admitting: Neurology

## 2023-09-11 NOTE — Telephone Encounter (Signed)
Called pt. Having ongoing memory issues. Sister has mentioned this has been worsening and wants her seen sooner.  Offered 09/19/23 at 9:30am. She is going to check with sister and call back to let me know. She is currently scheduled 10/10/23 at 9am.

## 2023-09-11 NOTE — Telephone Encounter (Signed)
Pt said having cognitive issues; can not remember where I put things. Patient asking if could be seen sooner. Informed patient nurse will call back if earlier appt available.

## 2023-09-11 NOTE — Telephone Encounter (Signed)
 Noted

## 2023-09-11 NOTE — Telephone Encounter (Signed)
Pt called back, and accepted the 02-12 appointment with the 9:00 arrival

## 2023-09-17 NOTE — Telephone Encounter (Signed)
 Pt LVM asking someone to call her back about Wednesday's appt, I called her back and LVM asking her to give us  another call if she'd still like to speak with someone.

## 2023-09-19 ENCOUNTER — Encounter: Payer: Self-pay | Admitting: Neurology

## 2023-09-19 ENCOUNTER — Ambulatory Visit: Payer: 59 | Admitting: Neurology

## 2023-10-09 ENCOUNTER — Ambulatory Visit: Payer: 59 | Admitting: Neurology

## 2023-10-10 ENCOUNTER — Ambulatory Visit: Payer: 59 | Admitting: Neurology

## 2023-10-11 ENCOUNTER — Telehealth: Payer: Self-pay | Admitting: *Deleted

## 2023-10-11 NOTE — Telephone Encounter (Signed)
 Called pt. Scheduled appt for 10/23/23 at 9am with Dr. Epimenio Foot.

## 2023-10-23 ENCOUNTER — Encounter: Payer: Self-pay | Admitting: Neurology

## 2023-10-23 ENCOUNTER — Ambulatory Visit (INDEPENDENT_AMBULATORY_CARE_PROVIDER_SITE_OTHER): Admitting: Neurology

## 2023-10-23 VITALS — BP 118/75 | HR 60 | Resp 14 | Ht 64.0 in | Wt 193.5 lb

## 2023-10-23 DIAGNOSIS — Z114 Encounter for screening for human immunodeficiency virus [HIV]: Secondary | ICD-10-CM

## 2023-10-23 DIAGNOSIS — G35 Multiple sclerosis: Secondary | ICD-10-CM

## 2023-10-23 DIAGNOSIS — F09 Unspecified mental disorder due to known physiological condition: Secondary | ICD-10-CM

## 2023-10-23 DIAGNOSIS — R41840 Attention and concentration deficit: Secondary | ICD-10-CM | POA: Insufficient documentation

## 2023-10-23 DIAGNOSIS — Z79899 Other long term (current) drug therapy: Secondary | ICD-10-CM

## 2023-10-23 DIAGNOSIS — N319 Neuromuscular dysfunction of bladder, unspecified: Secondary | ICD-10-CM | POA: Diagnosis not present

## 2023-10-23 DIAGNOSIS — R269 Unspecified abnormalities of gait and mobility: Secondary | ICD-10-CM | POA: Diagnosis not present

## 2023-10-23 MED ORDER — BUSPIRONE HCL 15 MG PO TABS: 15.0000 mg | ORAL_TABLET | Freq: Two times a day (BID) | ORAL | 5 refills | Status: AC

## 2023-10-23 MED ORDER — METHYLPHENIDATE HCL 10 MG PO TABS: ORAL_TABLET | ORAL | 0 refills | Status: DC

## 2023-10-23 NOTE — Progress Notes (Signed)
 [facility]  PATIENT: Connie Robertson DOB: 10/16/1970  REFERRING DOCTOR OR PCP: Dr. Anne Hahn SOURCE: Patient, notes from Dr. Anne Hahn, imaging and lab reports.  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  Labs 05/29/2023 showed normal lymphocyte count of 1.1 but WBC low at 2.8.   Slightly low platelet count of 143.   She feels she is doing well physically but she feels memory has worsened.    She has insomnia..  Ambien helped her fall asleep but when she ran out, she switched to an OTC PM preparation.  She has trouble quieting her mind at night.  She does not snore.  A PSG was done at some point and she does not have OSA.  She has some anxiety and some depression.       Currently, balance is reduced but she can walk without a cane.   She could walk 1/2 mile without a break and climb 3 flights of stairs holds rail).  She has not had any falls.  Leg strength is fairly symmetric.  She sometimes has some numbness in the legs.     She has a neurogenic bladder and has had some UTIs..  She is better with Botox  She reports reduced vision, unchanged compared to the last visit.  Her main concern is reduced cognition  Focus, attention, executive function and word finding are doing worse.   In the past, she was on phentermine and is slightly helped..  She has not tried a stronger stimulant like Ritalin or Adderall.   She has migraines and the combination of Aimovig with Depakote 500mg  po BID is helping the  best.  She was on topiramate in past.    Medications tried and failed for migraine: Anti-epileptics (topiramate, gabapent, dilantin, depakote, zonisamide), muscle relaxants, Botox, NSAIDs, tricyclics (nortriptyline), steroids, Aimovig, trigger point injections and   MS history: She was diagnosed with MS about 25 years.   She presented right facial weakness and twitching.   Initially she was felt to have Bells Palsy.  Studies were consistent with MS.   She was initially on the interferons and glatiramer.  She saw Dr. Sandria Manly initially and then moved to the [facility].  She switched to Gilenya and tolerated it well.   However, she began to experience exacerbation (vision, numbness, balance).   She moved to [facility] and switched to Tysabri in 2017 due to the breakthrough activity she has tolerated Tysabri well..   She is JCV antibody negative last checked 01/24/2021.   Possible small exacerbation vs fluctuation 03/2021.     Fluctuation vs. Small relapse with legs heavy 03/2022.      Migraine history: She has a long history of chronic migraine.  She currently is seeing Dr. Neale Burly.  Injections have not helped.  She did not get a benefit from Botox or Aimovig.  She is on Depakote and Topamax without much benefit.  When migraine occurs she notes visual blurring.   Imaging:  MRI of the brain 05/26/2020 shows multiple T2/FLAIR hyperintense foci  in the periventricular, juxtacortical, and deep white matter of the hemispheres and in the pons and cerebellum.  There was no change compared to 05/27/2019.   MRI of the cervical spine 05/26/2020 showed multiple foci within the spinal cord.  There is a large T2 hyperintense focus posteriorly to the left adjacent to C3 and smaller foci laterally to the right adjacent to C3, laterally to the left adjacent to C3, laterally to the right adjacent to C4, laterally to the left adjacent to C6, laterally to the left adjacent to C7, posteriorly adjacent to C7-T1 and laterally to the  right adjacent to T1.  No change compared to the MRI 09/05/2018.  She also had mild degenerative changes at C5-C6 and C6-C7 but no nerve root compression or spinal stenosis.  REVIEW OF SYSTEMS: Constitutional: No fevers, chills, sweats, or change in appetite Eyes: No visual changes, double vision, eye pain Ear, nose and throat: No hearing loss, ear pain, nasal congestion, sore throat Cardiovascular: No chest pain, palpitations Respiratory:  No shortness of breath at rest or with exertion.   No wheezes GastrointestinaI: No nausea, vomiting, diarrhea, abdominal pain, fecal incontinence Genitourinary:  No dysuria, urinary retention or frequency.  No nocturia. Musculoskeletal:  No neck pain, back pain Integumentary: No rash, pruritus, skin lesions Neurological: as above Psychiatric: No depression at this time.  No anxiety Endocrine: No palpitations, diaphoresis, change in appetite, change in weigh or increased thirst Hematologic/Lymphatic:  No anemia, purpura, petechiae. Allergic/Immunologic: No itchy/runny eyes, nasal congestion, recent allergic reactions, rashes  ALLERGIES: No Known Allergies  HOME MEDICATIONS:  Current Outpatient Medications:    acetaminophen (TYLENOL) 500 MG tablet, Take 1,000 mg by mouth every 6 (six) hours as needed for moderate pain (pain score 4-6)., Disp: , Rfl:    busPIRone (BUSPAR) 15 MG tablet, Take 1 tablet (15 mg total) by mouth 2 (two) times daily., Disp: 60 tablet, Rfl: 5   Cladribine, 9 Tabs, (MAVENCLAD, 9 TABS,) 10 MG TBPK, Take by mouth. Year 1 month 1: 10/24/22-10/28/22 Take 20 mg by mouth once daily on days 1-4. 10mg  on day 5. (Month 1) Take 20mg  by mouth once daily on days 1-3. 10mg  days 4-5. (Month 2), Disp: , Rfl:    divalproex (DEPAKOTE) 500 MG DR tablet, Take 1 tablet (500 mg total) by mouth 2 (two) times daily., Disp: 60 tablet, Rfl: 5   Erenumab-aooe (AIMOVIG) 140 MG/ML SOAJ, One pen subcutaneous every 4 weeks, Disp: 1 mL, Rfl: 12   estrogen,  conjugated,-medroxyprogesterone (PREMPRO) 0.625-2.5 MG tablet, Take 1 tablet by mouth daily., Disp: , Rfl:    Ibuprofen-diphenhydrAMINE Cit (ADVIL PM) 200-38 MG TABS, Take 1 tablet by mouth at bedtime., Disp: , Rfl:    Ibuprofen-diphenhydrAMINE HCl (ADVIL PM) 200-25 MG CAPS, Take 2 tablets by mouth at bedtime as needed (sleep)., Disp: , Rfl:    lisinopril-hydrochlorothiazide (ZESTORETIC) 10-12.5 MG tablet, Take 1 tablet by mouth daily., Disp: , Rfl:    methylphenidate (RITALIN) 10 MG tablet, One po qAM and one po about 4 hours later, Disp: 60 tablet, Rfl: 0   MYRBETRIQ 50 MG TB24 tablet, TAKE 1 TABLET(50 MG) BY MOUTH DAILY, Disp: 90 tablet, Rfl: 0   tiZANidine (ZANAFLEX) 4 MG tablet, Take 4 mg by mouth 2 (two) times daily as needed for muscle spasms., Disp: , Rfl:    topiramate (TOPAMAX) 200 MG tablet, Take 0.5 tablets (100 mg total) by mouth daily. (Patient taking differently: Take 200 mg by mouth at bedtime.), Disp: 60 tablet, Rfl: 1  zolpidem (AMBIEN) 10 MG tablet, Take 10 mg by mouth at bedtime as needed for sleep., Disp: , Rfl:   PAST MEDICAL HISTORY: Past Medical History:  Diagnosis Date   Abnormality of gait 11/25/2014   Chronic daily headache 2003   Love   Depression    Hypertension    Memory disorder 02/29/2016   MS (multiple sclerosis) (HCC) 1992   Dr. Sandria Manly   Neurogenic bladder 11/21/2013    PAST SURGICAL HISTORY: Past Surgical History:  Procedure Laterality Date   ABLATION COLPOCLESIS  08/07/2002   BOTOX INJECTION N/A 07/13/2023   Procedure: CYSTOSCOPY BOTOX INJECTION;  Surgeon: Crista Elliot, MD;  Location: [facility];  Service: Urology;  Laterality: N/A;  30 MINS FOR CASE   CESAREAN SECTION     x 2   TUBAL LIGATION      FAMILY HISTORY: Family History  Problem Relation Age of Onset   Heart failure Father    Multiple sclerosis Sister    Cancer Neg Hx    Diabetes Neg Hx    Hyperlipidemia Neg Hx    Hypertension Neg Hx    Kidney disease Neg Hx    Stroke Neg Hx      SOCIAL HISTORY:  Social History   Socioeconomic History   Marital status: Divorced    Spouse name: Not on file   Number of children: 2   Years of education: college   Highest education level: Not on file  Occupational History   Occupation: disability  Tobacco Use   Smoking status: Never   Smokeless tobacco: Never  Vaping Use   Vaping status: Never Used  Substance and Sexual Activity   Alcohol use: Yes    Comment: social   Drug use: No   Sexual activity: Yes    Birth control/protection: Surgical  Other Topics Concern   Not on file  Social History Narrative   Patient is right handed.   Patient drinks 2 cups of coffee daily.   Social Drivers of Corporate investment banker Strain: Not on file  Food Insecurity: Not on file  Transportation Needs: Not on file  Physical Activity: Not on file  Stress: Not on file  Social Connections: Not on file  Intimate Partner Violence: Not on file     PHYSICAL EXAM  Vitals:   10/23/23 0858  BP: 118/75  Pulse: 60  Resp: 14  Weight: 193 lb 8 oz (87.8 kg)  Height: 5\' 4"  (1.626 m)    Body mass index is 33.21 kg/m.   General: The patient is well-developed and well-nourished and in no acute distress  HEENT:  Head is [place]/AT.  Sclera are anicteric.      Skin: Extremities are without rash or  edema.  Neurologic Exam  Mental status: The patient is alert and oriented x 3 at the time of the examination. The patient has apparent normal recent and remote memory.  She was distractible with reduced focus/attention.Marland Kitchen   Speech is normal.  Cranial nerves: Extraocular movements are full.  Color vision is reduced on the right  Visual acuity was more symmetric.  Facial symmetry is present. There is good facial sensation to soft touch bilaterally.Facial strength is normal.  Trapezius and sternocleidomastoid strength is normal. No dysarthria is noted.   No obvious hearing deficits are noted.  Motor:  Muscle bulk is normal.   Tone is  normal. Strength is  5 / 5 in all 4 extremities.   Sensory: Sensory testing is intact to pinprick,  soft touch and vibration sensation in the arms.  She had reduced sensation to touch and vibration in the left leg.  Coordination: Cerebellar testing reveals good finger-nose-finger and heel-to-shin bilaterally.  Gait and station: Station is normal.   The gait is wide but she can walk without a cane in the office.  She is a unable to do a tandem gait and the Romberg is borderline  Reflexes: Deep tendon reflexes are symmetric and normal bilaterally.  No ankle clonus     DIAGNOSTIC DATA (LABS, IMAGING, TESTING) - I reviewed patient records, labs, notes, testing and imaging myself where available.  Lab Results  Component Value Date   WBC 2.6 (L) 06/20/2023   HGB 12.5 06/20/2023   HCT 38.5 06/20/2023   MCV 96.5 06/20/2023   PLT 143 (L) 06/20/2023      Component Value Date/Time   NA 142 06/20/2023 1000   NA 145 (H) 05/29/2023 1020   NA 142 04/05/2015 1435   K 3.9 06/20/2023 1000   K 3.9 04/05/2015 1435   CL 108 06/20/2023 1000   CO2 24 06/20/2023 1000   CO2 24 04/05/2015 1435   GLUCOSE 86 06/20/2023 1000   GLUCOSE 89 04/05/2015 1435   BUN 19 06/20/2023 1000   BUN 18 05/29/2023 1020   BUN 18.2 04/05/2015 1435   CREATININE 1.03 (H) 06/20/2023 1000   CREATININE 0.96 12/11/2022 0000   CREATININE 1.1 04/05/2015 1435   CALCIUM 9.3 06/20/2023 1000   CALCIUM 9.3 04/05/2015 1435   PROT 6.7 06/20/2023 1000   PROT 6.5 05/29/2023 1020   PROT 6.9 04/05/2015 1435   ALBUMIN 4.2 06/20/2023 1000   ALBUMIN 4.4 05/29/2023 1020   ALBUMIN 4.2 04/05/2015 1435   AST 32 06/20/2023 1000   AST 27 04/05/2015 1435   ALT 31 06/20/2023 1000   ALT 46 04/05/2015 1435   ALKPHOS 55 06/20/2023 1000   ALKPHOS 53 04/05/2015 1435   BILITOT 0.6 06/20/2023 1000   BILITOT <0.2 05/29/2023 1020   BILITOT 0.22 04/05/2015 1435   GFRNONAA >60 06/20/2023 1000   GFRAA 88 06/01/2020 1112   Lab Results   Component Value Date   CHOL 214 (H) 12/11/2022   HDL 69 12/11/2022   LDLCALC 129 (H) 12/11/2022   TRIG 67 12/11/2022   CHOLHDL 3.1 12/11/2022   No results found for: "HGBA1C" Lab Results  Component Value Date   VITAMINB12 438 12/11/2022   Lab Results  Component Value Date   TSH 0.48 12/11/2022       ASSESSMENT AND PLAN  Relapsing remitting multiple sclerosis (HCC) - Plan: Hepatitis B surface antigen, HIV Antibody (routine testing w rflx), QuantiFERON-TB Gold Plus, CBC with Differential/Platelet, Comprehensive metabolic panel, Hepatitis B core antibody, total  High risk medication use - Plan: Hepatitis B surface antigen, HIV Antibody (routine testing w rflx), QuantiFERON-TB Gold Plus, CBC with Differential/Platelet, Comprehensive metabolic panel, Hepatitis B core antibody, total  Abnormality of gait  Cognitive deficit secondary to multiple sclerosis (HCC)  Neurogenic bladder  Encounter for screening for HIV - Plan: HIV Antibody (routine testing w rflx)  Attention or concentration deficit   She will do a second year of Mavenclad.  We will recheck the chronic infection labs as well as CBC with differential and CMP.. Cognition is affected from the MS.  This has led to disability.   Consider neurocognitive testing if continues to worsen.   Consider a stimulant Stay active and exercise as tolerated. Continue anti-CGRP treatment along with Depakote for migraines. 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   RTC in 6  months but she should call sooner if new or worsening symptoms.  This visit is part of a comprehensive longitudinal care medical relationship regarding the patients primary diagnosis of MS and related concerns.   Aiden Helzer A. Epimenio Foot, MD, [facility] 10/23/2023, 12:51 PM Certified in Neurology, Clinical Neurophysiology, Sleep Medicine and Neuroimaging  [facility] [address], [address] [place], [place] [address] [phone]

## 2023-10-27 LAB — CBC WITH DIFFERENTIAL/PLATELET
Basophils Absolute: 0 10*3/uL (ref 0.0–0.2)
Basos: 0 %
EOS (ABSOLUTE): 0 10*3/uL (ref 0.0–0.4)
Eos: 1 %
Hematocrit: 38 % (ref 34.0–46.6)
Hemoglobin: 12.6 g/dL (ref 11.1–15.9)
Immature Grans (Abs): 0 10*3/uL (ref 0.0–0.1)
Immature Granulocytes: 0 %
Lymphocytes Absolute: 0.9 10*3/uL (ref 0.7–3.1)
Lymphs: 35 %
MCH: 32.3 pg (ref 26.6–33.0)
MCHC: 33.2 g/dL (ref 31.5–35.7)
MCV: 97 fL (ref 79–97)
Monocytes Absolute: 0.2 10*3/uL (ref 0.1–0.9)
Monocytes: 8 %
Neutrophils Absolute: 1.4 10*3/uL (ref 1.4–7.0)
Neutrophils: 56 %
Platelets: 158 10*3/uL (ref 150–450)
RBC: 3.9 x10E6/uL (ref 3.77–5.28)
RDW: 12.5 % (ref 11.7–15.4)
WBC: 2.5 10*3/uL — CL (ref 3.4–10.8)

## 2023-10-27 LAB — COMPREHENSIVE METABOLIC PANEL
ALT: 14 IU/L (ref 0–32)
AST: 19 IU/L (ref 0–40)
Albumin: 4.5 g/dL (ref 3.8–4.9)
Alkaline Phosphatase: 59 IU/L (ref 44–121)
BUN/Creatinine Ratio: 19 (ref 9–23)
BUN: 17 mg/dL (ref 6–24)
Bilirubin Total: <0.2 mg/dL (ref 0.0–1.2)
CO2: 24 mmol/L (ref 20–29)
Calcium: 9.5 mg/dL (ref 8.7–10.2)
Chloride: 105 mmol/L (ref 96–106)
Creatinine, Ser: 0.89 mg/dL (ref 0.57–1.00)
Globulin, Total: 2.2 g/dL (ref 1.5–4.5)
Glucose: 81 mg/dL (ref 70–99)
Potassium: 4 mmol/L (ref 3.5–5.2)
Sodium: 143 mmol/L (ref 134–144)
Total Protein: 6.7 g/dL (ref 6.0–8.5)
eGFR: 77 mL/min/{1.73_m2} (ref >59)

## 2023-10-27 LAB — QUANTIFERON-TB GOLD PLUS
QuantiFERON Mitogen Value: >10 [IU]/mL
QuantiFERON Nil Value: 0.01 [IU]/mL
QuantiFERON TB1 Ag Value: 0.04 [IU]/mL
QuantiFERON TB2 Ag Value: 0.02 [IU]/mL
QuantiFERON-TB Gold Plus: NEGATIVE

## 2023-10-27 LAB — HEPATITIS B CORE ANTIBODY, TOTAL: Hep B Core Total Ab: NEGATIVE

## 2023-10-27 LAB — HIV ANTIBODY (ROUTINE TESTING W REFLEX): HIV Screen 4th Generation wRfx: NONREACTIVE

## 2023-10-27 LAB — HEPATITIS B SURFACE ANTIGEN: Hepatitis B Surface Ag: NEGATIVE

## 2023-10-28 ENCOUNTER — Other Ambulatory Visit: Payer: Self-pay | Admitting: Neurology

## 2023-10-28 DIAGNOSIS — G35 Multiple sclerosis: Secondary | ICD-10-CM

## 2023-10-28 DIAGNOSIS — Z79899 Other long term (current) drug therapy: Secondary | ICD-10-CM

## 2023-10-29 ENCOUNTER — Telehealth: Payer: Self-pay | Admitting: *Deleted

## 2023-10-29 NOTE — Telephone Encounter (Signed)
 Victorino Dike spoke with pt earlier about results. Had some concerns about delaying Mavenclad start. I called pt and went over results again per Dr. Epimenio Foot note. Explained importance of rechecking labs prior to her starting. She verbalized understanding. Scheduled lab appt for 12/03/23 at 11am.

## 2023-10-29 NOTE — Telephone Encounter (Signed)
-----   Message from Asa Lente sent at 10/28/2023  4:19 PM EDT ----- I would like to recheck CBC with diff in about 6 weeks to make sure the white blood cell count is not dropping furhter before we do another year of Mavenclad   I placed the lab order

## 2023-11-01 ENCOUNTER — Other Ambulatory Visit: Payer: Self-pay | Admitting: Neurology

## 2023-11-01 DIAGNOSIS — G35 Multiple sclerosis: Secondary | ICD-10-CM

## 2023-11-01 DIAGNOSIS — N319 Neuromuscular dysfunction of bladder, unspecified: Secondary | ICD-10-CM

## 2023-11-01 NOTE — Telephone Encounter (Signed)
 Pt called wanting to confirm if this request has been received and when will it be filled. Please advise.

## 2023-11-14 ENCOUNTER — Telehealth: Payer: Self-pay | Admitting: Neurology

## 2023-11-14 NOTE — Telephone Encounter (Signed)
 Pt would like a call from the nurse to discuss getting on weigh loss medication

## 2023-11-15 ENCOUNTER — Other Ambulatory Visit: Payer: Self-pay | Admitting: Urology

## 2023-11-15 NOTE — Telephone Encounter (Signed)
 Spoke to patient gave Dr Epimenio Foot recommendation. Pt thanked me for calling

## 2023-11-23 ENCOUNTER — Other Ambulatory Visit: Payer: Self-pay | Admitting: Neurology

## 2023-11-26 NOTE — Telephone Encounter (Signed)
 Last seen on 10/23/23 Follow up scheduled on 05/22/24

## 2023-12-03 ENCOUNTER — Other Ambulatory Visit

## 2023-12-07 ENCOUNTER — Other Ambulatory Visit: Payer: Self-pay | Admitting: Neurology

## 2023-12-07 DIAGNOSIS — G35 Multiple sclerosis: Secondary | ICD-10-CM

## 2023-12-07 DIAGNOSIS — N319 Neuromuscular dysfunction of bladder, unspecified: Secondary | ICD-10-CM

## 2023-12-07 NOTE — Telephone Encounter (Signed)
 Last seen on 10/23/23 Follow up scheduled on 05/22/24     Pt should have refill until June.

## 2023-12-10 ENCOUNTER — Other Ambulatory Visit (INDEPENDENT_AMBULATORY_CARE_PROVIDER_SITE_OTHER)

## 2023-12-10 ENCOUNTER — Other Ambulatory Visit: Payer: Self-pay

## 2023-12-10 DIAGNOSIS — Z0289 Encounter for other administrative examinations: Secondary | ICD-10-CM

## 2023-12-10 DIAGNOSIS — Z79899 Other long term (current) drug therapy: Secondary | ICD-10-CM

## 2023-12-10 DIAGNOSIS — G35 Multiple sclerosis: Secondary | ICD-10-CM

## 2023-12-10 NOTE — Telephone Encounter (Signed)
 Called pt. Scheduled lab appt for repeat CBC prior to starting on yr 2 Mavenclad (missed 12/03/23 lab appt) for 12/11/23 at 4pm.

## 2023-12-11 ENCOUNTER — Telehealth: Payer: Self-pay | Admitting: *Deleted

## 2023-12-11 ENCOUNTER — Other Ambulatory Visit

## 2023-12-11 LAB — CBC WITH DIFFERENTIAL/PLATELET
Basophils Absolute: 0 10*3/uL (ref 0.0–0.2)
Basos: 0 %
EOS (ABSOLUTE): 0.1 10*3/uL (ref 0.0–0.4)
Eos: 2 %
Hematocrit: 38.6 % (ref 34.0–46.6)
Hemoglobin: 12.7 g/dL (ref 11.1–15.9)
Immature Grans (Abs): 0 10*3/uL (ref 0.0–0.1)
Immature Granulocytes: 0 %
Lymphocytes Absolute: 1.2 10*3/uL (ref 0.7–3.1)
Lymphs: 44 %
MCH: 32.2 pg (ref 26.6–33.0)
MCHC: 32.9 g/dL (ref 31.5–35.7)
MCV: 98 fL — ABNORMAL HIGH (ref 79–97)
Monocytes Absolute: 0.2 10*3/uL (ref 0.1–0.9)
Monocytes: 7 %
Neutrophils Absolute: 1.3 10*3/uL — ABNORMAL LOW (ref 1.4–7.0)
Neutrophils: 47 %
Platelets: 175 10*3/uL (ref 150–450)
RBC: 3.95 x10E6/uL (ref 3.77–5.28)
RDW: 12 % (ref 11.7–15.4)
WBC: 2.7 10*3/uL — ABNORMAL LOW (ref 3.4–10.8)

## 2023-12-11 NOTE — Telephone Encounter (Signed)
-----   Message from Jorie Newness sent at 12/11/2023 12:34 PM EDT ----- The white blood cell count is a little bit better than in March  so we can go ahead and do the second year of Mavenclad

## 2023-12-11 NOTE — Telephone Encounter (Signed)
 Called pt and relayed results per Dr. Thom Fleeting note. Pt verbalized understanding. Cx lab appt that was scheduled for today since she came yesterday to do labs.

## 2023-12-11 NOTE — Telephone Encounter (Signed)
Faxed completed/signed Mavenclad start form to [employer] at [phone]. Received fax confirmation.

## 2023-12-12 ENCOUNTER — Other Ambulatory Visit: Payer: Self-pay | Admitting: Internal Medicine

## 2023-12-12 ENCOUNTER — Ambulatory Visit
Admission: RE | Admit: 2023-12-12 | Discharge: 2023-12-12 | Disposition: A | Discharge status: Home / Self Care | Source: Ambulatory Visit | Attending: Internal Medicine | Admitting: Internal Medicine

## 2023-12-12 DIAGNOSIS — Z1231 Encounter for screening mammogram for malignant neoplasm of breast: Secondary | ICD-10-CM

## 2023-12-12 NOTE — Telephone Encounter (Signed)
 PA for [facility] completed via CMM. Sent to [employer]. Key: [id number]. Should have a determination within 3-5 business days.

## 2023-12-13 NOTE — Telephone Encounter (Signed)
 PA for [facility] was denied by [employer].

## 2023-12-13 NOTE — Telephone Encounter (Signed)
 Appeal letter completed. Please fax to [employer] appeals dept at [phone].

## 2023-12-13 NOTE — Telephone Encounter (Signed)
 Faxed urgent appeal to number below. Received fax confirmation, waiting on determination.

## 2023-12-25 NOTE — Telephone Encounter (Signed)
 I called [employer] at [phone]. Ref #[id number]  I spoke with Burdette Carolin. Unfortunately, their system is down and I will need to call again tomorrow.

## 2023-12-27 NOTE — Telephone Encounter (Signed)
 Called [employer] at [phone]. Ref# [id number]. Spoke w/ Hanley Lew. Searched for appeal and none on file. Provided me expedited appeal fax# [phone] to resend with office notes. Asked to include case# from PA denial (Ref# [id number]). I re-faxed to this number, received fax confirmation. Waiting on determination.

## 2024-01-03 NOTE — Telephone Encounter (Signed)
 I called [employer]. I spoke with Fiora. They received 2 appeals from us  and have voided one.  The first appeal for Mavenclad was received and was overturned.  Approved 12/12/2023-08/06/2024  [id number]  They will fax us  this determination to us  with Attn Odie Benne.  I have informed Javie at [facility] [employer].

## 2024-01-11 NOTE — Progress Notes (Addendum)
 Anesthesia Review:  PCP: Narda Bacon  Cardiologist : none  Neurology- Sater LOV 10/23/23   PPM/ ICD: Device Orders: Rep Notified:  Chest x-ray : EKG : 06/20/2023  Echo : Stress test: Cardiac Cath :   Activity level: can do a flight of stairs without difficutly  Sleep Study/ CPAP : none  Fasting Blood Sugar :      / Checks Blood Sugar -- times a day:    Blood Thinner/ Instructions /Last Dose: ASA / Instructions/ Last Dose :    LVMM on 01/16/24 .at 1302 LVMM on 01/16/24 at 1315      Phentermine - pt was instructed a preop that last dose is 01/16/2024 since she has already taken today until after surgery.  PT voiced understanding.   PT arrived in Admitting at 1325pm. Py esd 40 minutes late for preop appt.  Med hx completed preop instructons completed.

## 2024-01-11 NOTE — Patient Instructions (Signed)
 SURGICAL WAITING ROOM VISITATION  Patients having surgery or a procedure may have no more than 2 support people in the waiting area - these visitors may rotate.    Children under the age of 32 must have an adult with them who is not the patient.  Visitors with respiratory illnesses are discouraged from visiting and should remain at home.  If the patient needs to stay at the hospital during part of their recovery, the visitor guidelines for inpatient rooms apply. Pre-op nurse will coordinate an appropriate time for 1 support person to accompany patient in pre-op.  This support person may not rotate.    Please refer to the [facility] website for the visitor guidelines for Inpatients (after your surgery is over and you are in a regular room).       Your procedure is scheduled on:  02/01/2024    Report to [facility] Main Entrance    Report to admitting at   1000AM   Call this number if you have problems the morning of surgery [phone]   Do not eat food or drink liquids :After Midnight.    .               If you have questions, please contact your surgeon's office.      Oral Hygiene is also important to reduce your risk of infection.                                    Remember - BRUSH YOUR TEETH THE MORNING OF SURGERY WITH YOUR REGULAR TOOTHPASTE  DENTURES WILL BE REMOVED PRIOR TO SURGERY PLEASE DO NOT APPLY "Poly grip" OR ADHESIVES!!!   Do NOT smoke after Midnight   Stop all vitamins and herbal supplements 7 days before surgery.   Take these medicines the morning of surgery with A SIP OF WATER: buspar , depakote , myrbetriq    DO NOT TAKE ANY ORAL DIABETIC MEDICATIONS DAY OF YOUR SURGERY  Bring CPAP mask and tubing day of surgery.                              You may not have any metal on your body including hair pins, jewelry, and body piercing             Do not wear make-up, lotions, powders, perfumes/cologne, or deodorant  Do not wear nail polish  including gel and S&S, artificial/acrylic nails, or any other type of covering on natural nails including finger and toenails. If you have artificial nails, gel coating, etc. that needs to be removed by a nail salon please have this removed prior to surgery or surgery may need to be canceled/ delayed if the surgeon/ anesthesia feels like they are unable to be safely monitored.   Do not shave  48 hours prior to surgery.               Men may shave face and neck.   Do not bring valuables to the hospital. [facility] IS NOT             RESPONSIBLE   FOR VALUABLES.   Contacts, glasses, dentures or bridgework may not be worn into surgery.   Bring small overnight bag day of surgery.   DO NOT BRING YOUR HOME MEDICATIONS TO THE HOSPITAL. PHARMACY WILL DISPENSE MEDICATIONS LISTED ON YOUR MEDICATION LIST TO YOU DURING  YOUR ADMISSION IN THE HOSPITAL!    Patients discharged on the day of surgery will not be allowed to drive home.  Someone NEEDS to stay with you for the first 24 hours after anesthesia.   Special Instructions: Bring a copy of your healthcare power of attorney and living will documents the day of surgery if you haven't scanned them before.              Please read over the following fact sheets you were given: IF YOU HAVE QUESTIONS ABOUT YOUR PRE-OP INSTRUCTIONS PLEASE CALL [phone]   If you received a COVID test during your pre-op visit  it is requested that you wear a mask when out in public, stay away from anyone that may not be feeling well and notify your surgeon if you develop symptoms. If you test positive for Covid or have been in contact with anyone that has tested positive in the last 10 days please notify you surgeon.    [facility] - Preparing for Surgery Before surgery, you can play an important role.  Because skin is not sterile, your skin needs to be as free of germs as possible.  You can reduce the number of germs on your skin by washing with CHG (chlorahexidine  gluconate) soap before surgery.  CHG is an antiseptic cleaner which kills germs and bonds with the skin to continue killing germs even after washing. Please DO NOT use if you have an allergy to CHG or antibacterial soaps.  If your skin becomes reddened/irritated stop using the CHG and inform your nurse when you arrive at Short Stay. Do not shave (including legs and underarms) for at least 48 hours prior to the first CHG shower.  You may shave your face/neck. Please follow these instructions carefully:  1.  Shower with CHG Soap the night before surgery and the  morning of Surgery.  2.  If you choose to wash your hair, wash your hair first as usual with your  normal  shampoo.  3.  After you shampoo, rinse your hair and body thoroughly to remove the  shampoo.                           4.  Use CHG as you would any other liquid soap.  You can apply chg directly  to the skin and wash                       Gently with a scrungie or clean washcloth.  5.  Apply the CHG Soap to your body ONLY FROM THE NECK DOWN.   Do not use on face/ open                           Wound or open sores. Avoid contact with eyes, ears mouth and genitals (private parts).                       Wash face,  Genitals (private parts) with your normal soap.             6.  Wash thoroughly, paying special attention to the area where your surgery  will be performed.  7.  Thoroughly rinse your body with warm water from the neck down.  8.  DO NOT shower/wash with your normal soap after using and rinsing off  the CHG Soap.  9.  Pat yourself dry with a clean towel.            10.  Wear clean pajamas.            11.  Place clean sheets on your bed the night of your first shower and do not  sleep with pets. Day of Surgery : Do not apply any lotions/deodorants the morning of surgery.  Please wear clean clothes to the hospital/surgery center.  FAILURE TO FOLLOW THESE INSTRUCTIONS MAY RESULT IN THE CANCELLATION OF YOUR  SURGERY PATIENT SIGNATURE_________________________________  NURSE SIGNATURE__________________________________  ________________________________________________________________________

## 2024-01-14 NOTE — Telephone Encounter (Signed)
 I called Connie Robertson at [facility]. [employer] [facility] has patient's RX and should be reaching out to patient.

## 2024-01-16 ENCOUNTER — Encounter (HOSPITAL_COMMUNITY)
Admission: RE | Admit: 2024-01-16 | Discharge: 2024-01-16 | Disposition: A | Discharge status: Home / Self Care | Source: Ambulatory Visit | Attending: Urology | Admitting: Urology

## 2024-01-16 ENCOUNTER — Encounter (HOSPITAL_COMMUNITY): Payer: Self-pay

## 2024-01-16 VITALS — BP 135/82 | HR 56 | Temp 98.7°F | Resp 16 | Ht 64.0 in | Wt 196.0 lb

## 2024-01-16 DIAGNOSIS — Z79899 Other long term (current) drug therapy: Secondary | ICD-10-CM | POA: Insufficient documentation

## 2024-01-16 DIAGNOSIS — Z01812 Encounter for preprocedural laboratory examination: Secondary | ICD-10-CM | POA: Diagnosis present

## 2024-01-16 DIAGNOSIS — G35 Multiple sclerosis: Secondary | ICD-10-CM | POA: Diagnosis not present

## 2024-01-16 DIAGNOSIS — Z01818 Encounter for other preprocedural examination: Secondary | ICD-10-CM

## 2024-01-16 LAB — BASIC METABOLIC PANEL WITH GFR
Anion gap: 7 (ref 5–15)
BUN: 28 mg/dL — ABNORMAL HIGH (ref 6–20)
CO2: 26 mmol/L (ref 22–32)
Calcium: 9.4 mg/dL (ref 8.9–10.3)
Chloride: 108 mmol/L (ref 98–111)
Creatinine, Ser: 1.13 mg/dL — ABNORMAL HIGH (ref 0.44–1.00)
GFR, Estimated: 58 mL/min — ABNORMAL LOW (ref >60)
Glucose, Bld: 89 mg/dL (ref 70–99)
Potassium: 3.2 mmol/L — ABNORMAL LOW (ref 3.5–5.1)
Sodium: 141 mmol/L (ref 135–145)

## 2024-01-16 LAB — CBC
HCT: 37.1 % (ref 36.0–46.0)
Hemoglobin: 11.9 g/dL — ABNORMAL LOW (ref 12.0–15.0)
MCH: 31.7 pg (ref 26.0–34.0)
MCHC: 32.1 g/dL (ref 30.0–36.0)
MCV: 98.9 fL (ref 80.0–100.0)
Platelets: 144 10*3/uL — ABNORMAL LOW (ref 150–400)
RBC: 3.75 MIL/uL — ABNORMAL LOW (ref 3.87–5.11)
RDW: 12.9 % (ref 11.5–15.5)
WBC: 3.4 10*3/uL — ABNORMAL LOW (ref 4.0–10.5)
nRBC: 0 % (ref 0.0–0.2)

## 2024-01-21 NOTE — Telephone Encounter (Signed)
 I called patient.  No answer, left a voicemail asking her to call us  back.  I called patient to find out if she has received her Mavenclad and has started taking it.

## 2024-01-28 ENCOUNTER — Ambulatory Visit (HOSPITAL_COMMUNITY)
Admission: EM | Admit: 2024-01-28 | Discharge: 2024-01-28 | Disposition: A | Discharge status: Home / Self Care | Attending: Emergency Medicine | Admitting: Emergency Medicine

## 2024-01-28 ENCOUNTER — Encounter (HOSPITAL_COMMUNITY): Payer: Self-pay

## 2024-01-28 DIAGNOSIS — M79662 Pain in left lower leg: Secondary | ICD-10-CM

## 2024-01-28 NOTE — ED Triage Notes (Signed)
 Pt reports she noticed her left calf was painful and she cannot put pressure on it x 5 days

## 2024-01-28 NOTE — ED Provider Notes (Signed)
 [facility]    CSN: [id number] Arrival date & time: 01/28/24  1912      History   Chief Complaint Chief Complaint  Patient presents with   Leg Pain    HPI Connie Robertson is a 53 y.o. female.  Left posterior knee and calf pain for 4-5 days Painful to walk Denies injury or trauma, no falls She has been rubbing at the area and it's becoming more sore No history of blood clot Denies chest pain, shortness of breath  No interventions yet  History of multiple sclerosis, hypertension  Past Medical History:  Diagnosis Date   Abnormality of gait 11/25/2014   Chronic daily headache 2003   [facility]   Hypertension    Memory disorder 02/29/2016   MS (multiple sclerosis) (HCC) 1992   Dr. 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   Visual disturbance 02/29/2016   Cognitive deficit secondary to multiple sclerosis (HCC) 02/29/2016   Abnormality of gait 11/25/2014   Relapsing remitting multiple sclerosis (HCC) 11/21/2013   Neurogenic bladder 11/21/2013   Chronic tension-type headache, intractable 11/17/2011   Hot flashes 11/17/2011   Other screening mammogram 08/18/2011   Screen for STD (sexually transmitted disease) 08/18/2011    Past Surgical History:  Procedure Laterality Date   ABLATION COLPOCLESIS  08/07/2002   BOTOX  INJECTION N/A 07/13/2023   Procedure: CYSTOSCOPY BOTOX  INJECTION;  Surgeon: Carolee Sherwood JONETTA DOUGLAS, MD;  Location: [facility];  Service: Urology;  Laterality: N/A;  30 MINS FOR CASE   CESAREAN SECTION     x 2   TUBAL LIGATION      OB History     Gravida  3   Para  2   Term  2   Preterm      AB  1   Living  2      SAB  1   IAB      Ectopic      Multiple      Live Births               Home Medications    Prior to Admission  medications   Medication Sig Start Date End Date Taking? Authorizing Provider  busPIRone  (BUSPAR ) 15 MG tablet Take 1 tablet (15 mg total) by mouth 2 (two) times daily. 10/23/23   Sater, Charlie LABOR, MD  divalproex  (DEPAKOTE ) 500 MG DR tablet TAKE 1 TABLET(500 MG) BY MOUTH TWICE DAILY 11/26/23   Sater, Charlie LABOR, MD  Erenumab -aooe (AIMOVIG ) 140 MG/ML SOAJ One pen subcutaneous every 4 weeks 05/29/23   Sater, Charlie LABOR, MD  Ibuprofen-diphenhydrAMINE HCl (ADVIL PM) 200-25 MG CAPS Take 2 tablets by mouth at bedtime as needed (sleep).    [provider]  lisinopril -hydrochlorothiazide (ZESTORETIC) 10-12.5 MG tablet Take 1 tablet by mouth in the morning. 03/30/22   [provider]  methylphenidate  (RITALIN ) 10 MG tablet One po qAM and one po about 4 hours later Patient not taking: Reported on 01/09/2024 10/23/23   Vear Charlie LABOR, MD  mirabegron  ER (MYRBETRIQ ) 50 MG TB24 tablet TAKE 1 TABLET(50 MG) BY MOUTH DAILY 11/01/23   Sater, Charlie LABOR, MD  nitrofurantoin, macrocrystal-monohydrate, (MACROBID) 100 MG capsule Take 100 mg by mouth 2 (two) times daily. 01/09/24   [provider]  phentermine  (ADIPEX-P ) 37.5 MG tablet Take  37.5 mg by mouth daily before breakfast.    [provider]  topiramate  (TOPAMAX ) 200 MG tablet Take 0.5 tablets (100 mg total) by mouth daily. Patient taking differently: Take 200 mg by mouth at bedtime. 04/04/23   Sater, Charlie LABOR, MD  valACYclovir  (VALTREX ) 1000 MG tablet Take 1,000 mg by mouth at bedtime.    [provider]  zolpidem (AMBIEN) 10 MG tablet Take 10 mg by mouth at bedtime as needed for sleep. 04/20/23   [provider]    Family History Family History  Problem Relation Age of Onset   Heart failure Father    Multiple sclerosis Sister    Cancer Neg Hx    Diabetes Neg Hx    Hyperlipidemia Neg Hx    Hypertension Neg Hx    Kidney disease Neg Hx    Stroke Neg Hx    Breast cancer Neg Hx     Social History Social  History   Tobacco Use   Smoking status: Never   Smokeless tobacco: Never  Vaping Use   Vaping status: Never Used  Substance Use Topics   Alcohol use: Yes    Comment: social   Drug use: No     Allergies   Patient has no known allergies.   Review of Systems Review of Systems  As per HPI  Physical Exam Triage Vital Signs ED Triage Vitals  Encounter Vitals Group     BP 01/28/24 2011 126/78     Girls Systolic BP Percentile --      Girls Diastolic BP Percentile --      Boys Systolic BP Percentile --      Boys Diastolic BP Percentile --      Pulse Rate 01/28/24 2011 66     Resp 01/28/24 2011 18     Temp 01/28/24 2011 99.3 F (37.4 C)     Temp Source 01/28/24 2011 Oral     SpO2 01/28/24 2011 99 %     Weight --      Height --      Head Circumference --      Peak Flow --      Pain Score 01/28/24 2010 9     Pain Loc --      Pain Education --      Exclude from Growth Chart --    No data found.  Updated Vital Signs BP 126/78 (BP Location: Left Arm)   Pulse 66   Temp 99.3 F (37.4 C) (Oral)   Resp 18   SpO2 99%    Physical Exam Vitals and nursing note reviewed.  Constitutional:      General: She is not in acute distress.    Appearance: She is not ill-appearing.  HENT:     Mouth/Throat:     Pharynx: Oropharynx is clear.   Cardiovascular:     Rate and Rhythm: Normal rate and regular rhythm.     Pulses: Normal pulses.     Heart sounds: Normal heart sounds.  Pulmonary:     Effort: Pulmonary effort is normal.     Breath sounds: Normal breath sounds.   Musculoskeletal:     Cervical back: Normal range of motion.       Legs:     Comments: Tender posterior leg. Minor swelling of left calf compared with right. No erythema, wound, skin changes. Full ROM of lower extremities. Strength and sensation equal and intact. DP pulse 2+   Skin:    Capillary Refill: Capillary refill  takes less than 2 seconds.   Neurological:     Mental Status: She is alert and  oriented to person, place, and time.      UC Treatments / Results  Labs (all labs ordered are listed, but only abnormal results are displayed) Labs Reviewed - No data to display  EKG   Radiology No results found.  Procedures Procedures (including critical care time)  Medications Ordered in UC Medications - No data to display  Initial Impression / Assessment and Plan / UC Course  I have reviewed the triage vital signs and the nursing notes.  Pertinent labs & imaging results that were available during my care of the patient were reviewed by me and considered in my medical decision making (see chart for details).  Afebrile, stable vitals, well appearing.  Ultrasound tomorrow [facility] at [facility] Discussed if negative result, treat as muscular with ice, elevate, gentle stretching, pain control. Advised not to rub at the area anymore as this was increasing her pain and may cause bruising.  Follow with primary recommended Patient agrees to plan, all questions are answered  Final Clinical Impressions(s) / UC Diagnoses   Final diagnoses:  Pain of left calf   Discharge Instructions   None    ED Prescriptions   None    PDMP not reviewed this encounter.   Jeryl Stabs, PA-C 01/29/24 [id number]

## 2024-01-29 ENCOUNTER — Ambulatory Visit (HOSPITAL_COMMUNITY)
Admission: RE | Admit: 2024-01-29 | Discharge: 2024-01-29 | Disposition: A | Discharge status: Home / Self Care | Source: Ambulatory Visit | Attending: Emergency Medicine | Admitting: Emergency Medicine

## 2024-01-29 ENCOUNTER — Ambulatory Visit (HOSPITAL_COMMUNITY): Payer: Self-pay | Admitting: Emergency Medicine

## 2024-01-29 DIAGNOSIS — M79652 Pain in left thigh: Secondary | ICD-10-CM | POA: Diagnosis present

## 2024-01-30 NOTE — Telephone Encounter (Signed)
 Pt called in to [facility] for these results. Informed these would need to come from ordering provider. She states she called Urgent Care and they told her to call here. Please advise if someone can call this pt to let her know what results are. I tried to call [facility], I was not able to get through.

## 2024-01-31 NOTE — Telephone Encounter (Signed)
 Informed by front desk staff: Hello. the above pt called regarding results of ultrasound pt contact number [phone].  Patient informed and verbalized understanding. Per provider note: Discussed if negative result, treat as muscular with ice, elevate, gentle stretching, pain control. Advised not to rub at the area anymore as this was increasing her pain and may cause bruising. Patient verbalized understanding of this as well.

## 2024-02-01 ENCOUNTER — Ambulatory Visit (HOSPITAL_BASED_OUTPATIENT_CLINIC_OR_DEPARTMENT_OTHER): Payer: Self-pay | Admitting: Physician Assistant

## 2024-02-01 ENCOUNTER — Ambulatory Visit (HOSPITAL_COMMUNITY): Payer: Self-pay | Admitting: Physician Assistant

## 2024-02-01 ENCOUNTER — Other Ambulatory Visit: Payer: Self-pay

## 2024-02-01 ENCOUNTER — Encounter (HOSPITAL_COMMUNITY)
Admission: RE | Disposition: A | Discharge status: Home / Self Care | Payer: Self-pay | Source: Home / Self Care | Attending: Urology

## 2024-02-01 ENCOUNTER — Encounter (HOSPITAL_COMMUNITY): Payer: Self-pay | Admitting: Urology

## 2024-02-01 ENCOUNTER — Ambulatory Visit (HOSPITAL_COMMUNITY)
Admission: RE | Admit: 2024-02-01 | Discharge: 2024-02-01 | Disposition: A | Discharge status: Home / Self Care | Attending: Urology | Admitting: Urology

## 2024-02-01 DIAGNOSIS — I10 Essential (primary) hypertension: Secondary | ICD-10-CM | POA: Insufficient documentation

## 2024-02-01 DIAGNOSIS — G35 Multiple sclerosis: Secondary | ICD-10-CM | POA: Insufficient documentation

## 2024-02-01 DIAGNOSIS — Z01818 Encounter for other preprocedural examination: Secondary | ICD-10-CM

## 2024-02-01 DIAGNOSIS — N3941 Urge incontinence: Secondary | ICD-10-CM

## 2024-02-01 DIAGNOSIS — N319 Neuromuscular dysfunction of bladder, unspecified: Secondary | ICD-10-CM | POA: Diagnosis not present

## 2024-02-01 DIAGNOSIS — N3281 Overactive bladder: Secondary | ICD-10-CM | POA: Diagnosis not present

## 2024-02-01 HISTORY — PX: CYSTOSCOPY WITH INJECTION: SHX1424

## 2024-02-01 SURGERY — CYSTOSCOPY, WITH INJECTION OF BLADDER NECK OR BLADDER WALL
Anesthesia: General

## 2024-02-01 MED ORDER — ONDANSETRON HCL 4 MG/2ML IJ SOLN
INTRAMUSCULAR | Status: AC
  Filled 2024-02-01: qty 2

## 2024-02-01 MED ORDER — MIDAZOLAM HCL 2 MG/2ML IJ SOLN
INTRAMUSCULAR | Status: DC | PRN
  Administered 2024-02-01: 2 mg via INTRAVENOUS

## 2024-02-01 MED ORDER — FENTANYL CITRATE (PF) 100 MCG/2ML IJ SOLN
INTRAMUSCULAR | Status: AC
  Filled 2024-02-01: qty 2

## 2024-02-01 MED ORDER — MIDAZOLAM HCL 2 MG/2ML IJ SOLN
INTRAMUSCULAR | Status: AC
Start: 2024-02-01 — End: 2024-02-01
  Filled 2024-02-01: qty 2

## 2024-02-01 MED ORDER — MEPERIDINE HCL 50 MG/ML IJ SOLN: 6.2500 mg | INTRAMUSCULAR | Status: DC | PRN

## 2024-02-01 MED ORDER — CHLORHEXIDINE GLUCONATE 0.12 % MT SOLN
15.0000 mL | Freq: Once | OROMUCOSAL | Status: AC
  Administered 2024-02-01: 15 mL via OROMUCOSAL

## 2024-02-01 MED ORDER — PROPOFOL 10 MG/ML IV BOLUS
INTRAVENOUS | Status: DC | PRN
  Administered 2024-02-01: 200 mg via INTRAVENOUS

## 2024-02-01 MED ORDER — PROPOFOL 10 MG/ML IV BOLUS
INTRAVENOUS | Status: AC
  Filled 2024-02-01: qty 20

## 2024-02-01 MED ORDER — ONABOTULINUMTOXINA 100 UNITS IJ SOLR
INTRAMUSCULAR | Status: DC | PRN
  Administered 2024-02-01: 100 [IU] via INTRAMUSCULAR

## 2024-02-01 MED ORDER — LACTATED RINGERS IV SOLN
INTRAVENOUS | Status: DC
Start: 2024-02-01 — End: 2024-02-01

## 2024-02-01 MED ORDER — SODIUM CHLORIDE (PF) 0.9 % IJ SOLN
INTRAMUSCULAR | Status: AC
  Filled 2024-02-01: qty 10

## 2024-02-01 MED ORDER — OXYCODONE HCL 5 MG/5ML PO SOLN: 5.0000 mg | Freq: Once | ORAL | Status: DC | PRN

## 2024-02-01 MED ORDER — ONDANSETRON HCL 4 MG/2ML IJ SOLN: 4.0000 mg | Freq: Once | INTRAMUSCULAR | Status: DC | PRN

## 2024-02-01 MED ORDER — STERILE WATER FOR IRRIGATION IR SOLN
Status: DC | PRN
  Administered 2024-02-01: 500 mL

## 2024-02-01 MED ORDER — FENTANYL CITRATE PF 50 MCG/ML IJ SOSY: 25.0000 ug | PREFILLED_SYRINGE | INTRAMUSCULAR | Status: DC | PRN

## 2024-02-01 MED ORDER — DEXAMETHASONE SODIUM PHOSPHATE 10 MG/ML IJ SOLN
INTRAMUSCULAR | Status: DC | PRN
  Administered 2024-02-01: 4 mg via INTRAVENOUS

## 2024-02-01 MED ORDER — ONDANSETRON HCL 4 MG/2ML IJ SOLN
INTRAMUSCULAR | Status: DC | PRN
  Administered 2024-02-01: 4 mg via INTRAVENOUS

## 2024-02-01 MED ORDER — LIDOCAINE HCL (PF) 2 % IJ SOLN
INTRAMUSCULAR | Status: DC | PRN
  Administered 2024-02-01: 60 mg via INTRADERMAL

## 2024-02-01 MED ORDER — DEXAMETHASONE SODIUM PHOSPHATE 10 MG/ML IJ SOLN
INTRAMUSCULAR | Status: AC
  Filled 2024-02-01: qty 1

## 2024-02-01 MED ORDER — CEFAZOLIN SODIUM-DEXTROSE 2-4 GM/100ML-% IV SOLN
2.0000 g | INTRAVENOUS | Status: AC
  Administered 2024-02-01: 2 g via INTRAVENOUS
  Filled 2024-02-01: qty 100

## 2024-02-01 MED ORDER — ORAL CARE MOUTH RINSE: 15.0000 mL | Freq: Once | OROMUCOSAL | Status: AC

## 2024-02-01 MED ORDER — ONABOTULINUMTOXINA 100 UNITS IJ SOLR
INTRAMUSCULAR | Status: AC
  Filled 2024-02-01: qty 100

## 2024-02-01 MED ORDER — OXYCODONE HCL 5 MG PO TABS: 5.0000 mg | ORAL_TABLET | Freq: Once | ORAL | Status: DC | PRN

## 2024-02-01 MED ORDER — LIDOCAINE HCL (PF) 2 % IJ SOLN
INTRAMUSCULAR | Status: AC
  Filled 2024-02-01: qty 5

## 2024-02-01 MED ORDER — ACETAMINOPHEN 500 MG PO TABS
1000.0000 mg | ORAL_TABLET | Freq: Once | ORAL | Status: AC
  Administered 2024-02-01: 1000 mg via ORAL
  Filled 2024-02-01: qty 2

## 2024-02-01 MED ORDER — SODIUM CHLORIDE 0.9 % IR SOLN
Status: DC | PRN
  Administered 2024-02-01: 1000 mL

## 2024-02-01 SURGICAL SUPPLY — 13 items
BAG URO CATCHER STRL LF (MISCELLANEOUS) ×1 IMPLANT
CLOTH BEACON ORANGE TIMEOUT ST (SAFETY) ×1 IMPLANT
GLOVE BIO SURGEON STRL SZ7.5 (GLOVE) ×1 IMPLANT
GOWN STRL REUS W/ TWL XL LVL3 (GOWN DISPOSABLE) ×1 IMPLANT
KIT TURNOVER KIT A (KITS) ×1 IMPLANT
MANIFOLD NEPTUNE II (INSTRUMENTS) ×1 IMPLANT
NDL ASPIRATION 22 (NEEDLE) ×1 IMPLANT
NDL SAFETY ECLIPSE 18X1.5 (NEEDLE) IMPLANT
NEEDLE ASPIRATION 22 (NEEDLE) ×1 IMPLANT
PACK CYSTO (CUSTOM PROCEDURE TRAY) ×1 IMPLANT
SYR CONTROL 10ML LL (SYRINGE) IMPLANT
TUBING CONNECTING 10 (TUBING) IMPLANT
WATER STERILE IRR 3000ML UROMA (IV SOLUTION) ×1 IMPLANT

## 2024-02-01 NOTE — H&P (Signed)
 CC/HPI: cc: Urinary incontinence  HPI:  02/22/2023  53 year old female with a history of MS with neurogenic bladder secondary to relapsing/remitting MS. Diagnosed at an early age of 50. Was seen about 10 years ago. Previous urodynamics showed a small capacity bladder with detrusor instability and some DSD. She was treated at that time with Toviaz and Myrbetriq . Continues on Myrbetriq  now. Despite this continues to have urgency with associated urge urinary incontinence that is quite troublesome to her. Affects her quality of life. Wants to get into a relationship and this is troublesome to her.   05/09/2023  Patient underwent a urodynamics that revealed the following:  UDS SUMMARY  Connie Robertson held a max capacity of approx. 258 mls. Her 1st sensation was felt at 235 mls. No SUI noted. There was positive instability. Multiple unstable contractions noted on the study today. Some of these contractions were triggered from Valsalva. She leaked severely off this bladder instability. She was able to generate a voluntary contraction and void 103 mls with max flow of 18 ml/s. Max detrusor pressure while voiding was 83 cmH20. EMG leads were basically quiet during the voiding phase. PVR was approx. 149 mls. During cough and Valsalva, the bladder descended approximately 0-1 cm. No trabeculation was noted. No reflux was seen. She will return for UDS follow up.   She greatly desires to proceed with intradetrusor Botox .   07/23/2023: Connie Robertson is a 53 year old female who presents today for follow-up after she underwent intra detrusor Botox . She is very pleased with her results and reports she was able to sleep through most of the night without having to get up to urinate. She has had no leakage. She denies difficulty voiding. Denies gross hematuria.   01/08/2024  Patient underwent intra detrusor Botox  a little over 6 months ago. She is pleased with the results but is starting to have an increase in urgency. She is  scheduled for 6/27 for repeat Botox .     ALLERGIES: No Allergies    MEDICATIONS: Lisinopril   Amoxicillin -Pot Clavulanate 875-125 MG Tablet 1 tablet PO BID  Divalproex  Sodium  Furosemide  Nitrofurantoin  Prempro  Tizanidine Hcl  Topiramate  25 MG Oral Tablet Oral  Valacyclovir  Hcl     GU PSH: Complex cystometrogram, w/ void pressure and urethral pressure profile studies, any technique - 04/26/2023 Complex Uroflow - 04/26/2023 Emg surf Electrd - 04/26/2023 Inject For cystogram - 04/26/2023 Intrabd voidng Press - 04/26/2023       PSH Notes: Uterine Surgery, Cesarean Section, Tubal Ligation   NON-GU PSH: Cesarean Delivery Only - 2012 Tubal Ligation - 2012 Visit Complexity (formerly GPC1X) - 07/23/2023, 05/09/2023, 02/22/2023     GU PMH: 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 She has LUTS consisting of nocturia x4 with occasional nocturnal enuresis with very little daytime frequency but when she does get a strong urge she will occasionally have an episode of urge incontinence. She reports she underwent urodynamics in ~2011 and that study revealed that she apparently did not empty her bladder completely. She was taking oxybutynin  ER at the time of her initial evaluation and had been on that for some time but takes 15 mg b.i.d. and has significant dry mouth but no  constipation.   Urodynamics(08/31/11): She clearly has a small functional capacity bladder (275 cc) with detrusor instability as well as findings consistent with DSD. It would appear she would benefit from anticholinergic therapy or possibly combination therapy using an anticholinergic  and beta 3 agonist.   Treatment: Trial of Toviaz 8 mg and Myrbetriq  50 mg in combination.   NON-GU PMH: Multiple sclerosis, Multiple Sclerosis - 2014 Personal history of other diseases of the nervous system and sense organs, History of migraine headaches - 2014 Personal history of other mental and behavioral disorders, History of depression - 2014 Encounter for general adult medical examination without abnormal findings, Encounter for preventive health examination    FAMILY HISTORY: 2 daughters - Other Congestive Heart Failure - Runs In Family Death In The Family Father - Runs In Family Family Health Status - Mother's Age - Runs In Family Family Health Status Children _4__ Living Daughter - Runs In Family heart failure - Father multiple sclerosis - Runs In Family   SOCIAL HISTORY: Marital Status: Divorced Preferred Language: English; Race: Black or African American Current Smoking Status: Patient has never smoked.   Tobacco Use Assessment Completed: Used Tobacco in last 30 days? Drinks 2 caffeinated drinks per day.     Notes: Caffeine Use, Alcohol Use, Never A Smoker, Marital History - Currently Married   REVIEW OF SYSTEMS:    GU Review Female:   Patient denies frequent urination, hard to postpone urination, burning /pain with urination, get up at night to urinate, leakage of urine, stream starts and stops, trouble starting your stream, have to strain to urinate, and being pregnant.  Gastrointestinal (Upper):   Patient denies vomiting, indigestion/ heartburn, and nausea.  Gastrointestinal (Lower):   Patient denies diarrhea and constipation.  Constitutional:   Patient denies fever, night sweats, weight loss, and fatigue.  Skin:   Patient denies skin rash/ lesion and itching.  Eyes:   Patient denies blurred vision and double vision.  Ears/ Nose/ Throat:   Patient denies sore throat and sinus problems.  Hematologic/Lymphatic:   Patient denies swollen glands and easy bruising.   Cardiovascular:   Patient denies leg swelling and chest pains.  Respiratory:   Patient denies cough and shortness of breath.  Endocrine:   Patient denies excessive thirst.  Musculoskeletal:   Patient denies back pain and joint pain.  Neurological:   Patient denies headaches and dizziness.  Psychologic:   Patient denies depression and anxiety.   VITAL SIGNS:      01/08/2024 01:10 PM  Weight 175 lb / 79.38 kg  Height 64 in / 162.56 cm  BP 125/77 mmHg  Heart Rate 62 /min  Temperature 97.5 F / 36.3 C  BMI 30.0 kg/m   MULTI-SYSTEM PHYSICAL EXAMINATION:    Constitutional: Well-nourished. No physical deformities. Normally developed. Good grooming.  Respiratory: No labored breathing, no use of accessory muscles.   Gastrointestinal: No mass, no tenderness, no rigidity, non obese abdomen.  Eyes: Normal conjunctivae. Normal eyelids.  Musculoskeletal: Normal gait and station of head and neck.     Complexity of Data:  Source Of History:  Patient  Records Review:   Previous Doctor Records, Previous Patient Records   PROCEDURES:          Visit Complexity - G2211          Urinalysis w/Scope - 81001 Dipstick Dipstick Cont'd Micro  Color: Yellow Bilirubin: Neg WBC/hpf: 10 - 20/hpf  Appearance: Clear Ketones: Trace RBC/hpf: NS (Not Seen)  Specific Gravity: 1.025 Blood: Neg  Bacteria: Many (>50/hpf)  pH: 6.0 Protein: Neg Cystals: NS (Not Seen)  Glucose: Neg Urobilinogen: 0.2 Casts: NS (Not Seen)    Nitrites: Positive Trichomonas: Not Present    Leukocyte Esterase: 1+ Mucous: Not Present      Epithelial Cells: 6 - 10/hpf      Yeast: NS (Not Seen)      Sperm: Not Present    Notes:      ASSESSMENT:      ICD-10 Details  1 GU:   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 Will send her urine for culture. She was having a hard time leaving a urine so hopefully she is able to do so today. Proceed with Botox  as planned on 6/27   CC: Dr. Shelda           Next Appointment:      Next Appointment: 02/01/2024 12:00 PM    Appointment Type: Surgery     Location: [facility] [phone]    Provider: Sherwood Edison, III, M.D.    Reason for Visit: OP--[facility]--CY, BOTOX      Signed by Sherwood Edison, III, M.D. on 01/10/24 at 4:47 PM (EDT

## 2024-02-01 NOTE — Anesthesia Preprocedure Evaluation (Signed)
 Anesthesia Evaluation  Patient identified by MRN, date of birth, ID band Patient awake    Reviewed: Allergy & Precautions, NPO status , Patient's Chart, lab work & pertinent test results  Airway Mallampati: II  TM Distance: >3 FB Neck ROM: Full    Dental  (+) Dental Advisory Given   Pulmonary neg pulmonary ROS   breath sounds clear to auscultation       Cardiovascular hypertension, Pt. on medications  Rhythm:Regular Rate:Normal     Neuro/Psych  Headaches  Neuromuscular disease (MS)    GI/Hepatic negative GI ROS, Neg liver ROS,,,  Endo/Other  negative endocrine ROS    Renal/GU negative Renal ROS     Musculoskeletal   Abdominal   Peds  Hematology negative hematology ROS (+)   Anesthesia Other Findings   Reproductive/Obstetrics                             Anesthesia Physical Anesthesia Plan  ASA: 2  Anesthesia Plan: General   Post-op Pain Management: Tylenol PO (pre-op)*   Induction: Intravenous  PONV Risk Score and Plan: 3 and Dexamethasone, Ondansetron, Midazolam and Treatment may vary due to age or medical condition  Airway Management Planned: LMA  Additional Equipment:   Intra-op Plan:   Post-operative Plan: Extubation in OR  Informed Consent: I have reviewed the patients History and Physical, chart, labs and discussed the procedure including the risks, benefits and alternatives for the proposed anesthesia with the patient or authorized representative who has indicated his/her understanding and acceptance.     Dental advisory given  Plan Discussed with: CRNA  Anesthesia Plan Comments:        Anesthesia Quick Evaluation

## 2024-02-01 NOTE — Anesthesia Procedure Notes (Signed)
 Procedure Name: LMA Insertion Date/Time: 02/01/2024 12:24 PM  Performed by: Brandy Almarie BROCKS, CRNAPre-anesthesia Checklist: Patient identified, Emergency Drugs available, Suction available and Patient being monitored Patient Re-evaluated:Patient Re-evaluated prior to induction Oxygen Delivery Method: Circle system utilized Preoxygenation: Pre-oxygenation with 100% oxygen Induction Type: IV induction LMA: LMA inserted LMA Size: 4.0 Number of attempts: 1 Dental Injury: Teeth and Oropharynx as per pre-operative assessment

## 2024-02-01 NOTE — Op Note (Signed)
 Operative Note  Preoperative diagnosis:  1.  Urge incontinence with detrusor overactivity   Postoperative diagnosis: 1.  Urge incontinence with detrusor overactivity   Procedure(s): 1.  Cystoscopy with 100 units of intra detrusor Botox    Surgeon: Sherwood Edison, MD   Assistants: None   Anesthesia: General   Complications: None immediate   EBL: Minimal   Specimens: 1.  None   Drains/Catheters: 1.  None   Intraoperative findings: Normal urethra and bladder   Indication: 53 year old female with MS and detrusor overactivity with urge urinary incontinence presents for Botox .   Description of procedure:   The patient was identified and consent was obtained.  The patient was taken to the operating room and placed in the supine position.  The patient was placed under general anesthesia.  Perioperative antibiotics were administered.  The patient was placed in dorsal lithotomy.  Patient was prepped and draped in a standard sterile fashion and a timeout was performed.   A rigid cystoscope was advanced into the urethra and into the bladder.  Complete cystoscopy was performed with no abnormal findings.  100 units of Botox  was then systematically instilled into the detrusor through the needle throughout the bladder.  There was no significant active bleeding from the injection sites.  I drained the bladder withdrew the scope.  Patient tolerated the procedure well was stable postoperatively.   Plan:  repeat the Botox  in 6 months.

## 2024-02-01 NOTE — Transfer of Care (Signed)
 Immediate Anesthesia Transfer of Care Note  Patient: Connie Robertson  Procedure(s) Performed: CYSTOSCOPY, WITH INJECTION OF BLADDER NECK OR BLADDER WALL  Patient Location: PACU  Anesthesia Type:General  Level of Consciousness: drowsy  Airway & Oxygen Therapy: Patient Spontanous Breathing and Patient connected to face mask oxygen  Post-op Assessment: Report given to RN, Post -op Vital signs reviewed and stable, and Patient moving all extremities X 4  Post vital signs: Reviewed and stable  Last Vitals:  Vitals Value Taken Time  BP 135/75 02/01/24 12:45  Temp    Pulse 57 02/01/24 12:45  Resp 12 02/01/24 12:45  SpO2 97 % 02/01/24 12:45  Vitals shown include unfiled device data.  Last Pain:  Vitals:   02/01/24 1015  TempSrc:   PainSc: 0-No pain         Complications: No notable events documented.

## 2024-02-01 NOTE — Discharge Instructions (Addendum)
 He may have some blood in the urine.  This is okay as long as you can urinate

## 2024-02-01 NOTE — Anesthesia Postprocedure Evaluation (Signed)
 Anesthesia Post Note  Patient: Connie Robertson  Procedure(s) Performed: CYSTOSCOPY, WITH INJECTION OF BLADDER NECK OR BLADDER WALL     Patient location during evaluation: PACU Anesthesia Type: General Level of consciousness: awake and alert Pain management: pain level controlled Vital Signs Assessment: post-procedure vital signs reviewed and stable Respiratory status: spontaneous breathing, nonlabored ventilation, respiratory function stable and patient connected to nasal cannula oxygen Cardiovascular status: blood pressure returned to baseline and stable Postop Assessment: no apparent nausea or vomiting Anesthetic complications: no   No notable events documented.  Last Vitals:  Vitals:   02/01/24 1244 02/01/24 1245  BP: 132/68 135/75  Pulse:  (!) 57  Resp:  12  Temp:  (!) 36.4 C  SpO2:  97%    Last Pain:  Vitals:   02/01/24 1245  TempSrc:   PainSc: 0-No pain                 Terrilynn Postell

## 2024-02-02 ENCOUNTER — Encounter (HOSPITAL_COMMUNITY): Payer: Self-pay | Admitting: Urology

## 2024-02-05 NOTE — Telephone Encounter (Signed)
 Pt scheduled on 04/01/24 @ 3:30 pm with Amy lomax, NP

## 2024-02-05 NOTE — Addendum Note (Signed)
 Addended by: MINGO NEPTUNE A on: 02/05/2024 04:28 PM   Modules accepted: Orders

## 2024-02-05 NOTE — Telephone Encounter (Signed)
 I called patient. She reports taking her first month of Mavenclad at the end of June. She tolerated it well. I asked her to let us  know if she has trouble getting her second month, due the end of July. Her next appointment is not until October but usually Dr. Vear likes to see his new Mavenclad start patients 2 months after the first pill. She would be due for this at the end of August. Patient understands that her appointment will need to be moved up.

## 2024-02-08 ENCOUNTER — Other Ambulatory Visit: Payer: Self-pay | Admitting: Neurology

## 2024-02-08 DIAGNOSIS — G35 Multiple sclerosis: Secondary | ICD-10-CM

## 2024-02-08 DIAGNOSIS — N319 Neuromuscular dysfunction of bladder, unspecified: Secondary | ICD-10-CM

## 2024-02-20 ENCOUNTER — Telehealth: Payer: Self-pay | Admitting: Neurology

## 2024-02-20 NOTE — Telephone Encounter (Signed)
 FYI-Pt called wanting to inform provider that her second shipment of her Byrd has been delayed, so therefore she has not been able to start it yet.

## 2024-02-20 NOTE — Telephone Encounter (Signed)
 Called pt back and she stated that she had been calling her pharmacy to try and track when her next shipment of Mavenclad will be shipped to her and the rep told her they can't find her shipment and there isn't a tracking number. Pt wants to know if anything will happen to her if she does not get her shipment soon. Pt also states that she can't straighten out her left leg,nor can she put pressure on it,pt states she is going to go to either the ER or Urgent Care to get checked out.  Sending to MD to Advise pt

## 2024-03-04 NOTE — Telephone Encounter (Signed)
 Called pt back and asked her when she started her 2nd round of Mavenclad and pt states that she started her 2nd round on 02/21/2024 the same day she received her medication.

## 2024-03-04 NOTE — Telephone Encounter (Signed)
 Called pt back to f/u with her about her medication and her legs. Pt stated that she got her medication 1 week after we last spoke and has started her 2nd dose of Mavenclad. Asked pt how her legs were doing, pt stated that her legs were doing a lot better and she had fluid on them and had her knee drained. Pt stated that she feels as though her fluid in her leg has come back,pt is reaching out to her PCP about what her treatment options are.   Will call pt back to see when she started her 2nd round of Mavenclad.

## 2024-03-31 NOTE — Progress Notes (Deleted)
 No chief complaint on file.    HISTORY OF PRESENT ILLNESS:  03/31/24 ALL: Connie Robertson is a 53 y.o. female here today for follow up for RRMS. She completed second year of Mavenclad 02/2024. Labs have been normal. Last MRI brain and cervical spine stable 04/2022.   She reports that she is stable from an MS standpoint. No new or worsening symptoms. She continues to feel tired. She felt that she was having more concerns of facial twitching and difficulty emptying bladder in 05/2020. She admits that she was under more financial stress. Symptoms seem to be improving. No worsening numbness or weakness. She does occasionally drop things.  She continues baclofen  as needed for muscle spasms.  Myrbetriq  helps with urinary frequency.  Headaches seem to be well managed on zonisamide .  Adderall added at last visit?   She is having a hard time telling me what medications she is taking. She is not sure if she is taking divalproex  or phenytoin . She states that she took extra doses of one of these two medications for facial twitching and feels that she was having side effects. She is not sure if it was a capsule or tablet. She does have more difficulty with brain fog and short term memory loss. She is sleeping well. Mood is stable.    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 in 2024: Month 1 was March 2024 and month 2 was April 2024.  Labs 05/29/2023 showed normal lymphocyte count of 1.1 but WBC low at 2.8.   Slightly low platelet count of 143.    She feels she is doing well physically but she feels memory has worsened.    She has insomnia..  Ambien helped her fall asleep but when she ran out, she switched to an OTC PM preparation.  She has trouble quieting her mind at night.  She does not snore.  A PSG was done at some point and she does not have OSA.   She has some anxiety and some depression.        Currently, balance is reduced but she can walk without a cane.   She could walk 1/2 mile without a break and climb 3 flights of stairs holds rail).  She has not had any falls.  Leg strength is fairly symmetric.  She sometimes has some numbness in the legs.     She has a neurogenic bladder and has had some UTIs..  She is better with Botox    She reports reduced vision, unchanged compared to the last visit.   Her main concern is reduced cognition  Focus, attention, executive function and word finding are doing worse.   In the past, she was on phentermine  and is slightly helped..  She has not tried a stronger stimulant like Ritalin  or Adderall.    She has migraines and the combination of Aimovig  with Depakote  500mg  po BID is helping the best.  She was on topiramate  in past.       Medications tried and failed for migraine: Anti-epileptics (topiramate , gabapent, dilantin , depakote , zonisamide ), muscle relaxants, Botox , NSAIDs, tricyclics (nortriptyline ), steroids, Aimovig , trigger point injections and    MS history: She was diagnosed with MS about 25 years.   She presented right facial weakness and twitching.   Initially she was felt to have Bells Palsy.  Studies were consistent with MS.  She was initially on the interferons and glatiramer.  She saw Dr. Maurice initially and then moved to the [facility].  She switched to Gilenya  and tolerated it well.   However, she began to experience exacerbation (vision, numbness, balance).   She moved to [facility] and switched to Tysabri  in 2017 due to the breakthrough activity she has tolerated Tysabri  well..   She is JCV antibody negative last checked 01/24/2021.   Possible small exacerbation vs fluctuation 03/2021.     Fluctuation vs. Small relapse with legs heavy 03/2022.       Migraine history: She has a long history of chronic migraine.  She currently is seeing Dr. Oneita.  Injections have not helped.  She did not  get a benefit from Botox  or Aimovig .  She is on Depakote  and Topamax  without much benefit.  When migraine occurs she notes visual blurring.     Imaging:  MRI of the brain 05/26/2020 shows multiple T2/FLAIR hyperintense foci in the periventricular, juxtacortical, and deep white matter of the hemispheres and in the pons and cerebellum.  There was no change compared to 05/27/2019.    MRI of the cervical spine 05/26/2020 showed multiple foci within the spinal cord.  There is a large T2 hyperintense focus posteriorly to the left adjacent to C3 and smaller foci laterally to the right adjacent to C3, laterally to the left adjacent to C3, laterally to the right adjacent to C4, laterally to the left adjacent to C6, laterally to the left adjacent to C7, posteriorly adjacent to C7-T1 and laterally to the right adjacent to T1.  No change compared to the MRI 09/05/2018.  She also had mild degenerative changes at C5-C6 and C6-C7 but no nerve root compression or spinal stenosis.   REVIEW OF SYSTEMS: Out of a complete 14 system review of symptoms, the patient complains only of the following symptoms, brain fog, facial twitching, fatigue and all other reviewed systems are negative.   ALLERGIES: No Known Allergies   HOME MEDICATIONS: Outpatient Medications Prior to Visit  Medication Sig Dispense Refill   busPIRone  (BUSPAR ) 15 MG tablet Take 1 tablet (15 mg total) by mouth 2 (two) times daily. 60 tablet 5   Cladribine, 8 Tabs, (MAVENCLAD, 8 TABS,) 10 MG TBPK Take by mouth.     divalproex  (DEPAKOTE ) 500 MG DR tablet TAKE 1 TABLET(500 MG) BY MOUTH TWICE DAILY 60 tablet 5   Erenumab -aooe (AIMOVIG ) 140 MG/ML SOAJ One pen subcutaneous every 4 weeks 1 mL 12   Ibuprofen-diphenhydrAMINE HCl (ADVIL PM) 200-25 MG CAPS Take 2 tablets by mouth at bedtime as needed (sleep).     lisinopril -hydrochlorothiazide (ZESTORETIC) 10-12.5 MG tablet Take 1 tablet by mouth in the morning.     methylphenidate  (RITALIN ) 10 MG tablet One  po qAM and one po about 4 hours later (Patient not taking: Reported on 01/09/2024) 60 tablet 0   mirabegron  ER (MYRBETRIQ ) 50 MG TB24 tablet TAKE 1 TABLET(50 MG) BY MOUTH DAILY 90 tablet 2   nitrofurantoin, macrocrystal-monohydrate, (MACROBID) 100 MG capsule Take 100 mg by mouth 2 (two) times daily.     phentermine  (ADIPEX-P ) 37.5 MG tablet Take 37.5 mg by mouth daily before breakfast.     topiramate  (TOPAMAX ) 200 MG tablet Take 0.5 tablets (100 mg total) by mouth daily. (Patient taking differently: Take 200 mg by mouth at bedtime.) 60 tablet 1   valACYclovir  (VALTREX ) 1000 MG tablet Take 1,000 mg by mouth at bedtime.     zolpidem (AMBIEN) 10 MG tablet Take 10  mg by mouth at bedtime as needed for sleep.     No facility-administered medications prior to visit.     PAST MEDICAL HISTORY: Past Medical History:  Diagnosis Date   Abnormality of gait 11/25/2014   Chronic daily headache 2003   Love   Hypertension    Memory disorder 02/29/2016   MS (multiple sclerosis) (HCC) 1992   Dr. Maurice   Neurogenic bladder 11/21/2013     PAST SURGICAL HISTORY: Past Surgical History:  Procedure Laterality Date   ABLATION COLPOCLESIS  08/07/2002   BOTOX  INJECTION N/A 07/13/2023   Procedure: CYSTOSCOPY BOTOX  INJECTION;  Surgeon: Carolee Sherwood JONETTA DOUGLAS, MD;  Location: [facility];  Service: Urology;  Laterality: N/A;  30 MINS FOR CASE   CESAREAN SECTION     x 2   CYSTOSCOPY WITH INJECTION N/A 02/01/2024   Procedure: CYSTOSCOPY, WITH INJECTION OF BLADDER NECK OR BLADDER WALL;  Surgeon: Carolee Sherwood JONETTA DOUGLAS, MD;  Location: [facility];  Service: Urology;  Laterality: N/A;  INJECT 100 UNITS OF BOTOX    TUBAL LIGATION       FAMILY HISTORY: Family History  Problem Relation Age of Onset   Heart failure Father    Multiple sclerosis Sister    Cancer Neg Hx    Diabetes Neg Hx    Hyperlipidemia Neg Hx    Hypertension Neg Hx    Kidney disease Neg Hx    Stroke Neg Hx    Breast cancer Neg Hx      SOCIAL HISTORY: Social  History   Socioeconomic History   Marital status: Divorced    Spouse name: Not on file   Number of children: 2   Years of education: college   Highest education level: Not on file  Occupational History   Occupation: disability  Tobacco Use   Smoking status: Never   Smokeless tobacco: Never  Vaping Use   Vaping status: Never Used  Substance and Sexual Activity   Alcohol use: Yes    Comment: social   Drug use: No   Sexual activity: Yes    Birth control/protection: Surgical  Other Topics Concern   Not on file  Social History Narrative   Patient is right handed.   Patient drinks 2 cups of coffee daily.   Social Drivers of Corporate investment banker Strain: Not on file  Food Insecurity: Not on file  Transportation Needs: Not on file  Physical Activity: Not on file  Stress: Not on file  Social Connections: Not on file  Intimate Partner Violence: Not on file      PHYSICAL EXAM  There were no vitals filed for this visit.  There is no height or weight on file to calculate BMI.   Generalized: Well developed, in no acute distress  Cardiology: normal rate and rhythm, no murmur auscultated  Respiratory: clear to auscultation bilaterally    Neurological examination  Mentation: Alert oriented to time, place, history taking. Follows all commands speech and language fluent Cranial nerve II-XII: Pupils were equal round reactive to light. Extraocular movements were full, visual field were full on confrontational test. Facial sensation and strength were normal. Head turning and shoulder shrug  were normal and symmetric. Motor: The motor testing reveals 5 over 5 strength of all 4 extremities. Good symmetric motor tone is noted throughout.  Sensory: Sensory testing is intact to soft touch on all 4 extremities. No evidence of extinction is noted.  Coordination: Cerebellar testing reveals good finger-nose-finger and heel-to-shin bilaterally.  Gait and  station: Gait is normal.   Reflexes: Deep tendon reflexes are symmetric and normal bilaterally.     DIAGNOSTIC DATA (LABS, IMAGING, TESTING) - I reviewed patient records, labs, notes, testing and imaging myself where available.  Lab Results  Component Value Date   WBC 3.4 (L) 01/16/2024   HGB 11.9 (L) 01/16/2024   HCT 37.1 01/16/2024   MCV 98.9 01/16/2024   PLT 144 (L) 01/16/2024      Component Value Date/Time   NA 141 01/16/2024 1358   NA 143 10/23/2023 0951   NA 142 04/05/2015 1435   K 3.2 (L) 01/16/2024 1358   K 3.9 04/05/2015 1435   CL 108 01/16/2024 1358   CO2 26 01/16/2024 1358   CO2 24 04/05/2015 1435   GLUCOSE 89 01/16/2024 1358   GLUCOSE 89 04/05/2015 1435   BUN 28 (H) 01/16/2024 1358   BUN 17 10/23/2023 0951   BUN 18.2 04/05/2015 1435   CREATININE 1.13 (H) 01/16/2024 1358   CREATININE 0.96 12/11/2022 0000   CREATININE 1.1 04/05/2015 1435   CALCIUM 9.4 01/16/2024 1358   CALCIUM 9.3 04/05/2015 1435   PROT 6.7 10/23/2023 0951   PROT 6.9 04/05/2015 1435   ALBUMIN 4.5 10/23/2023 0951   ALBUMIN 4.2 04/05/2015 1435   AST 19 10/23/2023 0951   AST 27 04/05/2015 1435   ALT 14 10/23/2023 0951   ALT 46 04/05/2015 1435   ALKPHOS 59 10/23/2023 0951   ALKPHOS 53 04/05/2015 1435   BILITOT <0.2 10/23/2023 0951   BILITOT 0.22 04/05/2015 1435   GFRNONAA 58 (L) 01/16/2024 1358   GFRAA 88 06/01/2020 1112   Lab Results  Component Value Date   CHOL 214 (H) 12/11/2022   HDL 69 12/11/2022   LDLCALC 129 (H) 12/11/2022   TRIG 67 12/11/2022   CHOLHDL 3.1 12/11/2022   No results found for: HGBA1C Lab Results  Component Value Date   VITAMINB12 438 12/11/2022   Lab Results  Component Value Date   TSH 0.48 12/11/2022        No data to display           ASSESSMENT AND PLAN  53 y.o. year old female  has a past medical history of Abnormality of gait (11/25/2014), Chronic daily headache (2003), Hypertension, Memory disorder (02/29/2016), MS (multiple sclerosis) (HCC) (1992), and  Neurogenic bladder (11/21/2013). here with   No diagnosis found.  Connie Robertson is doing well from any MS standpoint.  We will continue Tysabri  infusions monthly.  She is unable to tell me today whether she is taken phenytoin  or divalproex .  I have asked that she review her medications at home and call me with an updated medication list.  I will update labs today to assess for levels of these medications.  She will continue baclofen , Myrbetriq  and zonisamide  as prescribed.  Healthy lifestyle habits encouraged.  She will follow-up in 6 months, sooner if needed.  She verbalizes understanding and agreement with this plan.  No orders of the defined types were placed in this encounter.     I spent 30 minutes of face-to-face and non-face-to-face time with patient.  This included previsit chart review, lab review, study review, order entry, electronic health record documentation, patient education.    Greig Forbes, MSN, FNP-C 03/31/2024, 3:26 PM  [facility] [address], [address] [place], [place] [address] [phone]

## 2024-03-31 NOTE — Patient Instructions (Incomplete)

## 2024-04-01 ENCOUNTER — Ambulatory Visit: Admitting: Family Medicine

## 2024-04-01 ENCOUNTER — Encounter: Payer: Self-pay | Admitting: Family Medicine

## 2024-04-01 DIAGNOSIS — G35 Multiple sclerosis: Secondary | ICD-10-CM

## 2024-04-11 ENCOUNTER — Other Ambulatory Visit (HOSPITAL_COMMUNITY): Payer: Self-pay | Admitting: Surgical

## 2024-04-11 ENCOUNTER — Ambulatory Visit (HOSPITAL_COMMUNITY)
Admission: RE | Admit: 2024-04-11 | Discharge: 2024-04-11 | Disposition: A | Discharge status: Home / Self Care | Source: Ambulatory Visit | Attending: Vascular Surgery | Admitting: Vascular Surgery

## 2024-04-11 DIAGNOSIS — M79605 Pain in left leg: Secondary | ICD-10-CM

## 2024-04-15 ENCOUNTER — Telehealth: Payer: Self-pay | Admitting: Neurology

## 2024-04-15 NOTE — Telephone Encounter (Signed)
Patient called to verify appointment date and time

## 2024-05-15 ENCOUNTER — Other Ambulatory Visit: Payer: Self-pay | Admitting: Neurology

## 2024-05-15 DIAGNOSIS — N319 Neuromuscular dysfunction of bladder, unspecified: Secondary | ICD-10-CM

## 2024-05-15 DIAGNOSIS — G35A Relapsing-remitting multiple sclerosis: Secondary | ICD-10-CM

## 2024-05-22 ENCOUNTER — Ambulatory Visit: Admitting: Neurology

## 2024-06-02 ENCOUNTER — Other Ambulatory Visit: Payer: Self-pay | Admitting: Neurology

## 2024-06-02 ENCOUNTER — Telehealth: Payer: Self-pay | Admitting: *Deleted

## 2024-06-02 DIAGNOSIS — N319 Neuromuscular dysfunction of bladder, unspecified: Secondary | ICD-10-CM

## 2024-06-02 DIAGNOSIS — G35A Relapsing-remitting multiple sclerosis: Secondary | ICD-10-CM

## 2024-06-02 NOTE — Telephone Encounter (Signed)
 Pt called to request medication refill, Pharmacy informed Pt to call office  mirabegron  ER (MYRBETRIQ ) 50 MG TB24 tablet   Pt medication is to be sent to  [id number] [place], [place] - [address] AT [facility] OF [address] & [facility] [address] Phone: [phone]  Fax: [phone]

## 2024-06-02 NOTE — Telephone Encounter (Signed)
 Patient to check on why medication has not been filled. Informed patient request has been sent to PA team.

## 2024-06-02 NOTE — Telephone Encounter (Signed)
 Phone room: Rx refill already sent to below [facility] 02/11/24 #90, 2 refills. She can call and request them to transfer refills remaining to other [facility]. Please call pt back and let her know.   [facility] #[id number] - [place], [place] - [address] AT [facility] OF [address], [place] [address]

## 2024-06-02 NOTE — Telephone Encounter (Signed)
 Called and informed pt. Pt was appreciative and stated that she will call the [facility] [address] [facility]

## 2024-06-02 NOTE — Telephone Encounter (Signed)
 Pt said pharmacist told her PA is needed for Myrbetriq  50 mg tablet.

## 2024-06-02 NOTE — Telephone Encounter (Signed)
 Possible duplicate: Hover to review recent actions on this medication    Rx has already been approved today.

## 2024-06-02 NOTE — Telephone Encounter (Signed)
 Pt has called back stating the pharmacist at  [facility] is telling her that a PA is needed for the  mirabegron  ER (MYRBETRIQ ) 50 MG TB24 tablet  pt asked it be noted she has 2 pills left

## 2024-06-02 NOTE — Telephone Encounter (Signed)
 Message sent to PA team.

## 2024-06-03 ENCOUNTER — Other Ambulatory Visit (HOSPITAL_COMMUNITY): Payer: Self-pay

## 2024-06-03 MED ORDER — MIRABEGRON ER 50 MG PO TB24: 50.0000 mg | ORAL_TABLET | Freq: Every day | ORAL | 0 refills | Status: DC

## 2024-06-03 NOTE — Addendum Note (Signed)
 Addended by: JOSHUA MAURILIO CROME on: 06/03/2024 09:13 AM   Modules accepted: Orders

## 2024-06-03 NOTE — Telephone Encounter (Signed)
 Pt called stating that Pt medication order was canceled and MD will have to send another order over for medication . In formed  Pt that medication  was sent yesterday and confirm received .  Pt state Pharmacy need new order   mirabegron  ER (MYRBETRIQ ) 50 MG TB24 tablet   Pharmacy  [facility] #[id number] - [place], [place] - [address] AT [facility] OF [address] (Ph: [phone])

## 2024-06-03 NOTE — Telephone Encounter (Signed)
 Pharmacy Patient Advocate Encounter   Received notification from Pt Calls Messages that prior authorization for Brand Myrbetriq  50 mg is required/requested.   Insurance verification completed.   The patient is insured through [employer].   Per test claim: The current 30 day co-pay is, $$0.00.  No PA needed at this time. This test claim was processed through [facility]- copay amounts may vary at other pharmacies due to pharmacy/plan contracts, or as the patient moves through the different stages of their insurance plan.

## 2024-06-03 NOTE — Telephone Encounter (Signed)
 Called [facility] at : [phone]. Spoke w/ Dorothyann. Confirmed rx is not on file. Looks like pt may have cx on app. I e-scribed rx again.

## 2024-07-05 ENCOUNTER — Other Ambulatory Visit: Payer: Self-pay | Admitting: Neurology

## 2024-07-28 ENCOUNTER — Other Ambulatory Visit: Payer: Self-pay | Admitting: Urology

## 2024-07-29 NOTE — Patient Instructions (Addendum)
 SURGICAL WAITING ROOM VISITATION  Patients having surgery or a procedure may have no more than 2 support people in the waiting area - these visitors may rotate.    Children ages 70 and under will not be able to visit patients in [facility] under most circumstances.   Visitors with respiratory illnesses are discouraged from visiting and should remain at home.  If the patient needs to stay at the hospital during part of their recovery, the visitor guidelines for inpatient rooms apply. Pre-op nurse will coordinate an appropriate time for 1 support person to accompany patient in pre-op.  This support person may not rotate.    Please refer to the [facility] website for the visitor guidelines for Inpatients (after your surgery is over and you are in a regular room).       Your procedure is scheduled on: 08/11/24   Report to [facility] Main Entrance    Report to admitting at  8:45 AM   Call this number if you have problems the morning of surgery [phone]   Do not eat food or drink liquids :After Midnight.but may have sips of water  to take meds.    Oral Hygiene is also important to reduce your risk of infection.                                    Remember - BRUSH YOUR TEETH THE MORNING OF SURGERY WITH YOUR REGULAR TOOTHPASTE    Stop all vitamins and herbal supplements 7 days before surgery.   Take these medicines the morning of surgery with A SIP OF WATER : Buspar , depakote , myrbetriq                Do not take Lisinopril /hydrochlorothiazide or phentermine  the morning of surgery.              You may not have any metal on your body including hair pins, jewelry, and body piercing             Do not wear make-up, lotions, powders, perfumes/cologne, or deodorant  Do not wear nail polish including gel and S&S, artificial/acrylic nails, or any other type of covering on natural nails including finger and toenails. If you have artificial nails, gel coating, etc. that  needs to be removed by a nail salon please have this removed prior to surgery or surgery may need to be canceled/ delayed if the surgeon/ anesthesia feels like they are unable to be safely monitored.   Do not shave  48 hours prior to surgery.             Do not bring valuables to the hospital. [facility] IS NOT             RESPONSIBLE   FOR VALUABLES.   Contacts, glasses, dentures or bridgework may not be worn into surgery.   DO NOT BRING YOUR HOME MEDICATIONS TO THE HOSPITAL. PHARMACY WILL DISPENSE MEDICATIONS LISTED ON YOUR MEDICATION LIST TO YOU DURING YOUR ADMISSION IN THE HOSPITAL!    Patients discharged on the day of surgery will not be allowed to drive home.  Someone NEEDS to stay with you for the first 24 hours after anesthesia.   Special Instructions: Bring a copy of your healthcare power of attorney and living will documents the day of surgery if you haven't scanned them before.  Please read over the following fact sheets you were given: IF YOU HAVE QUESTIONS ABOUT YOUR PRE-OP INSTRUCTIONS PLEASE CALL [phone] [place]   If you received a COVID test during your pre-op visit  it is requested that you wear a mask when out in public, stay away from anyone that may not be feeling well and notify your surgeon if you develop symptoms. If you test positive for Covid or have been in contact with anyone that has tested positive in the last 10 days please notify you surgeon.    [facility] - Preparing for Surgery Before surgery, you can play an important role.  Because skin is not sterile, your skin needs to be as free of germs as possible.  You can reduce the number of germs on your skin by washing with CHG (chlorahexidine gluconate) soap before surgery.  CHG is an antiseptic cleaner which kills germs and bonds with the skin to continue killing germs even after washing. Please DO NOT use if you have an allergy to CHG or antibacterial soaps.  If your skin becomes  reddened/irritated stop using the CHG and inform your nurse when you arrive at Short Stay. Do not shave (including legs and underarms) for at least 48 hours prior to the first CHG shower.  You may shave your face/neck.  Please follow these instructions carefully:  1.  Shower with CHG Soap the night before surgery and the morning of surgery.  2.  If you choose to wash your hair, wash your hair first as usual with your normal  shampoo.  3.  After you shampoo, rinse your hair and body thoroughly to remove the shampoo.                             4.  Use CHG as you would any other liquid soap.  You can apply chg directly to the skin and wash.  Gently with a scrungie or clean washcloth.  5.  Apply the CHG Soap to your body ONLY FROM THE NECK DOWN.   Do   not use on face/ open                           Wound or open sores. Avoid contact with eyes, ears mouth and   genitals (private parts).                       Wash face,  Genitals (private parts) with your normal soap.             6.  Wash thoroughly, paying special attention to the area where your    surgery  will be performed.  7.  Thoroughly rinse your body with warm water  from the neck down.  8.  DO NOT shower/wash with your normal soap after using and rinsing off the CHG Soap.                9.  Pat yourself dry with a clean towel.            10.  Wear clean pajamas.            11.  Place clean sheets on your bed the night of your first shower and do not  sleep with pets. Day of Surgery : Do not apply any CHG, lotions/deodorants the morning of surgery.  Please wear clean clothes to the hospital/surgery center.  FAILURE TO FOLLOW THESE INSTRUCTIONS MAY RESULT IN THE CANCELLATION OF YOUR SURGERY  PATIENT SIGNATURE_________________________________  NURSE SIGNATURE__________________________________  ________________________________________________________________________

## 2024-07-29 NOTE — Progress Notes (Addendum)
 COVID Vaccine received:  []  No [x]  Yes Date of any COVID positive Test in last 90 days: none PCP - Talitha Repress MD Cardiologist -   Chest x-ray -  EKG -   Stress Test -  ECHO -  Cardiac Cath -   Bowel Prep - [x]  No  []   Yes ______  Pacemaker / ICD device [x]  No []  Yes   Spinal Cord Stimulator:[x]  No []  Yes       History of Sleep Apnea? [x]  No []  Yes   CPAP used?- [x]  No []  Yes    Does the patient monitor blood sugar?          [x]  No []  Yes  []  N/A  Patient has: [x]  NO Hx DM   []  Pre-DM                 []  DM1  []   DM2 Does patient have a [employer] or Dexacom? []  No []  Yes   Fasting Blood Sugar Ranges-  Checks Blood Sugar _____ times a day  GLP1 agonist / usual dose - no GLP1 instructions:  SGLT-2 inhibitors / usual dose - no SGLT-2 instructions:   Blood Thinner / Instructions:no Aspirin Instructions:no  Comments:   Activity level: Patient is able to climb a flight of stairs without difficulty; [x]  No CP  [x]  No SOB,    Patient can perform ADLs without assistance.   Anesthesia review: MS, HTN  Patient denies shortness of breath, fever, cough and chest pain at PAT appointment.  Patient verbalized understanding and agreement to the Pre-Surgical Instructions that were given to them at this PAT appointment. Patient was also educated of the need to review these PAT instructions again prior to his/her surgery.I reviewed the appropriate phone numbers to call if they have any and questions or concerns.

## 2024-08-01 ENCOUNTER — Encounter (HOSPITAL_COMMUNITY)
Admission: RE | Admit: 2024-08-01 | Discharge: 2024-08-01 | Disposition: A | Discharge status: Home / Self Care | Source: Ambulatory Visit | Attending: Urology | Admitting: Urology

## 2024-08-01 ENCOUNTER — Other Ambulatory Visit: Payer: Self-pay

## 2024-08-01 ENCOUNTER — Encounter (HOSPITAL_COMMUNITY): Payer: Self-pay | Admitting: *Deleted

## 2024-08-01 VITALS — BP 147/99 | HR 74 | Temp 98.3°F | Resp 16 | Ht 64.0 in | Wt 175.0 lb

## 2024-08-01 DIAGNOSIS — Z01818 Encounter for other preprocedural examination: Secondary | ICD-10-CM | POA: Insufficient documentation

## 2024-08-01 DIAGNOSIS — Z01810 Encounter for preprocedural cardiovascular examination: Secondary | ICD-10-CM | POA: Diagnosis present

## 2024-08-01 DIAGNOSIS — I10 Essential (primary) hypertension: Secondary | ICD-10-CM | POA: Diagnosis not present

## 2024-08-01 DIAGNOSIS — Z01812 Encounter for preprocedural laboratory examination: Secondary | ICD-10-CM | POA: Diagnosis present

## 2024-08-01 HISTORY — DX: Unspecified osteoarthritis, unspecified site: M19.90

## 2024-08-01 HISTORY — DX: Myoneural disorder, unspecified: G70.9

## 2024-08-01 LAB — CBC
HCT: 36.8 % (ref 36.0–46.0)
Hemoglobin: 12.3 g/dL (ref 12.0–15.0)
MCH: 31.6 pg (ref 26.0–34.0)
MCHC: 33.4 g/dL (ref 30.0–36.0)
MCV: 94.6 fL (ref 80.0–100.0)
Platelets: 147 K/uL — ABNORMAL LOW (ref 150–400)
RBC: 3.89 MIL/uL (ref 3.87–5.11)
RDW: 13 % (ref 11.5–15.5)
WBC: 3.3 K/uL — ABNORMAL LOW (ref 4.0–10.5)
nRBC: 0 % (ref 0.0–0.2)

## 2024-08-01 LAB — BASIC METABOLIC PANEL WITH GFR
Anion gap: 9 (ref 5–15)
BUN: 24 mg/dL — ABNORMAL HIGH (ref 6–20)
CO2: 30 mmol/L (ref 22–32)
Calcium: 9.5 mg/dL (ref 8.9–10.3)
Chloride: 107 mmol/L (ref 98–111)
Creatinine, Ser: 0.98 mg/dL (ref 0.44–1.00)
GFR, Estimated: >60 mL/min
Glucose, Bld: 88 mg/dL (ref 70–99)
Potassium: 4.1 mmol/L (ref 3.5–5.1)
Sodium: 145 mmol/L (ref 135–145)

## 2024-08-11 ENCOUNTER — Encounter (HOSPITAL_COMMUNITY)
Admission: RE | Disposition: A | Discharge status: Home / Self Care | Payer: Self-pay | Source: Ambulatory Visit | Attending: Urology

## 2024-08-11 ENCOUNTER — Encounter (HOSPITAL_COMMUNITY): Payer: Self-pay | Admitting: Urology

## 2024-08-11 ENCOUNTER — Ambulatory Visit (HOSPITAL_COMMUNITY): Admitting: Anesthesiology

## 2024-08-11 ENCOUNTER — Ambulatory Visit (HOSPITAL_COMMUNITY)
Admission: RE | Admit: 2024-08-11 | Discharge: 2024-08-11 | Disposition: A | Discharge status: Home / Self Care | Source: Ambulatory Visit | Attending: Urology | Admitting: Urology

## 2024-08-11 DIAGNOSIS — N3941 Urge incontinence: Secondary | ICD-10-CM

## 2024-08-11 DIAGNOSIS — N3281 Overactive bladder: Secondary | ICD-10-CM | POA: Insufficient documentation

## 2024-08-11 DIAGNOSIS — I10 Essential (primary) hypertension: Secondary | ICD-10-CM | POA: Insufficient documentation

## 2024-08-11 DIAGNOSIS — M1990 Unspecified osteoarthritis, unspecified site: Secondary | ICD-10-CM | POA: Diagnosis not present

## 2024-08-11 DIAGNOSIS — G35D Multiple sclerosis, unspecified: Secondary | ICD-10-CM | POA: Insufficient documentation

## 2024-08-11 HISTORY — PX: CYSTOSCOPY WITH INJECTION: SHX1424

## 2024-08-11 SURGERY — CYSTOSCOPY, WITH INJECTION OF BLADDER NECK OR BLADDER WALL
Anesthesia: General | Site: Bladder

## 2024-08-11 MED ORDER — ACETAMINOPHEN 10 MG/ML IV SOLN: 1000.0000 mg | Freq: Once | INTRAVENOUS | Status: DC | PRN

## 2024-08-11 MED ORDER — LIDOCAINE HCL (PF) 2 % IJ SOLN
INTRAMUSCULAR | Status: AC
  Filled 2024-08-11: qty 20

## 2024-08-11 MED ORDER — LIDOCAINE HCL (PF) 2 % IJ SOLN
INTRAMUSCULAR | Status: DC | PRN
  Administered 2024-08-11: 100 mg via INTRADERMAL

## 2024-08-11 MED ORDER — ONABOTULINUMTOXINA 100 UNITS IJ SOLR
INTRAMUSCULAR | Status: AC
  Filled 2024-08-11: qty 100

## 2024-08-11 MED ORDER — ONDANSETRON HCL 4 MG/2ML IJ SOLN: 4.0000 mg | Freq: Once | INTRAMUSCULAR | Status: DC | PRN

## 2024-08-11 MED ORDER — LACTATED RINGERS IV SOLN: INTRAVENOUS | Status: DC

## 2024-08-11 MED ORDER — ONABOTULINUMTOXINA 100 UNITS IJ SOLR
INTRAMUSCULAR | Status: DC | PRN
  Administered 2024-08-11: 100 [IU] via INTRAMUSCULAR

## 2024-08-11 MED ORDER — CEFAZOLIN SODIUM-DEXTROSE 2-4 GM/100ML-% IV SOLN
2.0000 g | INTRAVENOUS | Status: AC
  Administered 2024-08-11: 2 g via INTRAVENOUS
  Filled 2024-08-11: qty 100

## 2024-08-11 MED ORDER — ONDANSETRON HCL 4 MG/2ML IJ SOLN
INTRAMUSCULAR | Status: DC | PRN
  Administered 2024-08-11: 4 mg via INTRAVENOUS

## 2024-08-11 MED ORDER — PROPOFOL 500 MG/50ML IV EMUL
INTRAVENOUS | Status: AC
  Filled 2024-08-11: qty 50

## 2024-08-11 MED ORDER — PHENYLEPHRINE 80 MCG/ML (10ML) SYRINGE FOR IV PUSH (FOR BLOOD PRESSURE SUPPORT)
PREFILLED_SYRINGE | INTRAVENOUS | Status: AC
  Filled 2024-08-11: qty 10

## 2024-08-11 MED ORDER — DEXAMETHASONE SOD PHOSPHATE PF 10 MG/ML IJ SOLN
INTRAMUSCULAR | Status: DC | PRN
  Administered 2024-08-11: 10 mg via INTRAVENOUS

## 2024-08-11 MED ORDER — ONDANSETRON HCL 4 MG/2ML IJ SOLN
INTRAMUSCULAR | Status: AC
  Filled 2024-08-11: qty 2

## 2024-08-11 MED ORDER — SODIUM CHLORIDE (PF) 0.9 % IJ SOLN
INTRAMUSCULAR | Status: AC
  Filled 2024-08-11: qty 10

## 2024-08-11 MED ORDER — OXYCODONE HCL 5 MG/5ML PO SOLN: 5.0000 mg | Freq: Once | ORAL | Status: DC | PRN

## 2024-08-11 MED ORDER — CHLORHEXIDINE GLUCONATE 0.12 % MT SOLN
15.0000 mL | Freq: Once | OROMUCOSAL | Status: AC
  Administered 2024-08-11: 15 mL via OROMUCOSAL

## 2024-08-11 MED ORDER — EPHEDRINE 5 MG/ML INJ
INTRAVENOUS | Status: AC
  Filled 2024-08-11: qty 5

## 2024-08-11 MED ORDER — PROPOFOL 10 MG/ML IV BOLUS
INTRAVENOUS | Status: DC | PRN
  Administered 2024-08-11: 20 mg via INTRAVENOUS
  Administered 2024-08-11: 150 mg via INTRAVENOUS

## 2024-08-11 MED ORDER — OXYCODONE HCL 5 MG PO TABS: 5.0000 mg | ORAL_TABLET | Freq: Once | ORAL | Status: DC | PRN

## 2024-08-11 MED ORDER — PROPOFOL 10 MG/ML IV BOLUS
INTRAVENOUS | Status: AC
  Filled 2024-08-11: qty 20

## 2024-08-11 MED ORDER — FENTANYL CITRATE (PF) 100 MCG/2ML IJ SOLN
INTRAMUSCULAR | Status: DC | PRN
  Administered 2024-08-11: 50 ug via INTRAVENOUS

## 2024-08-11 MED ORDER — ORAL CARE MOUTH RINSE: 15.0000 mL | Freq: Once | OROMUCOSAL | Status: AC

## 2024-08-11 MED ORDER — MIDAZOLAM HCL 2 MG/2ML IJ SOLN
INTRAMUSCULAR | Status: AC
  Filled 2024-08-11: qty 2

## 2024-08-11 MED ORDER — FENTANYL CITRATE (PF) 100 MCG/2ML IJ SOLN
INTRAMUSCULAR | Status: AC
  Filled 2024-08-11: qty 2

## 2024-08-11 MED ORDER — FENTANYL CITRATE (PF) 50 MCG/ML IJ SOSY: 25.0000 ug | PREFILLED_SYRINGE | INTRAMUSCULAR | Status: DC | PRN

## 2024-08-11 MED ORDER — STERILE WATER FOR IRRIGATION IR SOLN
Status: DC | PRN
  Administered 2024-08-11: 3000 mL

## 2024-08-11 SURGICAL SUPPLY — 12 items
BAG URO CATCHER STRL LF (MISCELLANEOUS) ×1 IMPLANT
CLOTH BEACON ORANGE TIMEOUT ST (SAFETY) ×1 IMPLANT
GLOVE BIO SURGEON STRL SZ7.5 (GLOVE) ×1 IMPLANT
GOWN STRL REUS W/ TWL XL LVL3 (GOWN DISPOSABLE) ×1 IMPLANT
KIT TURNOVER KIT A (KITS) ×1 IMPLANT
MANIFOLD NEPTUNE II (INSTRUMENTS) ×1 IMPLANT
NDL SAFETY ECLIP 18X1.5 (MISCELLANEOUS) IMPLANT
NEEDLE ASPIRATION 22 (NEEDLE) ×1 IMPLANT
PACK CYSTO (CUSTOM PROCEDURE TRAY) ×1 IMPLANT
SYR CONTROL 10ML LL (SYRINGE) IMPLANT
TUBING CONNECTING 10 (TUBING) IMPLANT
WATER STERILE IRR 3000ML UROMA (IV SOLUTION) ×1 IMPLANT

## 2024-08-11 NOTE — Discharge Instructions (Addendum)
 You may have some blood in the urine. This is ok as long as you are able to urinate

## 2024-08-11 NOTE — Anesthesia Preprocedure Evaluation (Addendum)
"                                    Anesthesia Evaluation  Patient identified by MRN, date of birth, ID band Patient awake    Reviewed: Allergy & Precautions, NPO status , Patient's Chart, lab work & pertinent test results, reviewed documented beta blocker date and time   History of Anesthesia Complications Negative for: history of anesthetic complications  Airway Mallampati: II  TM Distance: >3 FB     Dental no notable dental hx.    Pulmonary neg COPD   breath sounds clear to auscultation       Cardiovascular hypertension,  Rhythm:Regular Rate:Normal     Neuro/Psych  Headaches, neg Seizures PSYCHIATRIC DISORDERS  Depression     Neuromuscular disease (MS)    GI/Hepatic ,,,(+) neg Cirrhosis        Endo/Other  neg diabetes    Renal/GU Renal disease     Musculoskeletal  (+) Arthritis , Osteoarthritis,    Abdominal   Peds  Hematology   Anesthesia Other Findings   Reproductive/Obstetrics                              Anesthesia Physical Anesthesia Plan  ASA: 2  Anesthesia Plan: General   Post-op Pain Management:    Induction: Intravenous  PONV Risk Score and Plan: 3 and Ondansetron  and Dexamethasone   Airway Management Planned: LMA  Additional Equipment:   Intra-op Plan:   Post-operative Plan: Extubation in OR  Informed Consent: I have reviewed the patients History and Physical, chart, labs and discussed the procedure including the risks, benefits and alternatives for the proposed anesthesia with the patient or authorized representative who has indicated his/her understanding and acceptance.     Dental advisory given  Plan Discussed with: CRNA  Anesthesia Plan Comments:          Anesthesia Quick Evaluation  "

## 2024-08-11 NOTE — Interval H&P Note (Signed)
 History and Physical Interval Note:  08/11/2024 11:00 AM  Connie Robertson  has presented today for surgery, with the diagnosis of OVERACTIVE BLADDER.  The various methods of treatment have been discussed with the patient and family. After consideration of risks, benefits and other options for treatment, the patient has consented to  Procedures with comments: CYSTOSCOPY, WITH INJECTION OF BLADDER NECK OR BLADDER WALL (N/A) - INJECT 100 UNITS OF INTRAVESICAL BOTOX  as a surgical intervention.  The patient's history has been reviewed, patient examined, no change in status, stable for surgery.  I have reviewed the patient's chart and labs.  Questions were answered to the patient's satisfaction.     Sherwood JONETTA Edison, III

## 2024-08-11 NOTE — Transfer of Care (Signed)
 Immediate Anesthesia Transfer of Care Note  Patient: Connie Robertson  Procedure(s) Performed: CYSTOSCOPY, WITH INJECTION OF BLADDER NECK OR BLADDER WALL (Bladder)  Patient Location: PACU  Anesthesia Type:General  Level of Consciousness: awake, alert , and oriented  Airway & Oxygen Therapy: Patient Spontanous Breathing and Patient connected to face mask oxygen  Post-op Assessment: Report given to RN, Post -op Vital signs reviewed and stable, and Patient moving all extremities  Post vital signs: Reviewed and stable  Last Vitals:  Vitals Value Taken Time  BP 146/80 08/11/24 12:00  Temp    Pulse 73 08/11/24 12:04  Resp 16 08/11/24 12:04  SpO2 100 % 08/11/24 12:04  Vitals shown include unfiled device data.  Last Pain:  Vitals:   08/11/24 0924  TempSrc: Oral  PainSc: 0-No pain         Complications: There were no known notable events for this encounter.

## 2024-08-11 NOTE — Op Note (Signed)
 Operative Note   Preoperative diagnosis:  1.  Urge incontinence with detrusor overactivity   Postoperative diagnosis: 1.  Urge incontinence with detrusor overactivity   Procedure(s): 1.  Cystoscopy with 100 units of intra detrusor Botox    Surgeon: Sherwood Edison, MD   Assistants: None   Anesthesia: General   Complications: None immediate   EBL: Minimal   Specimens: 1.  None   Drains/Catheters: 1.  None   Intraoperative findings: Normal urethra and bladder   Indication: 53 year old female with MS and detrusor overactivity with urge urinary incontinence presents for Botox .   Description of procedure:   The patient was identified and consent was obtained.  The patient was taken to the operating room and placed in the supine position.  The patient was placed under general anesthesia.  Perioperative antibiotics were administered.  The patient was placed in dorsal lithotomy.  Patient was prepped and draped in a standard sterile fashion and a timeout was performed.   A rigid cystoscope was advanced into the urethra and into the bladder.  Complete cystoscopy was performed with no abnormal findings.  100 units of Botox  was then systematically instilled into the detrusor through the needle throughout the bladder.  There was no significant active bleeding from the injection sites.  I drained the bladder withdrew the scope.  Patient tolerated the procedure well was stable postoperatively.   Plan:  repeat the Botox  in 6 months.

## 2024-08-11 NOTE — Anesthesia Procedure Notes (Addendum)
 Procedure Name: LMA Insertion Date/Time: 08/11/2024 11:32 AM  Performed by: Franchot Delon RAMAN, CRNAPre-anesthesia Checklist: Patient identified, Emergency Drugs available, Suction available, Patient being monitored and Timeout performed Patient Re-evaluated:Patient Re-evaluated prior to induction Oxygen Delivery Method: Circle system utilized Preoxygenation: Pre-oxygenation with 100% oxygen Induction Type: IV induction Ventilation: Mask ventilation without difficulty LMA: LMA inserted LMA Size: 4.0 Number of attempts: 1 Airway Equipment and Method: Bite block Placement Confirmation: positive ETCO2 Tube secured with: Tape Dental Injury: Teeth and Oropharynx as per pre-operative assessment

## 2024-08-11 NOTE — Anesthesia Postprocedure Evaluation (Signed)
"   Anesthesia Post Note  Patient: Connie Robertson  Procedure(s) Performed: CYSTOSCOPY, WITH INJECTION OF BLADDER NECK OR BLADDER WALL (Bladder)     Patient location during evaluation: PACU Anesthesia Type: General Level of consciousness: awake and alert Pain management: pain level controlled Vital Signs Assessment: post-procedure vital signs reviewed and stable Respiratory status: spontaneous breathing, nonlabored ventilation, respiratory function stable and patient connected to nasal cannula oxygen Cardiovascular status: blood pressure returned to baseline and stable Postop Assessment: no apparent nausea or vomiting Anesthetic complications: no   There were no known notable events for this encounter.  Last Vitals:  Vitals:   08/11/24 1200 08/11/24 1232  BP:  (!) 155/90  Pulse:    Resp:  16  Temp: 36.5 C 36.4 C  SpO2:  97%    Last Pain:  Vitals:   08/11/24 1232  TempSrc: Oral  PainSc: 0-No pain                 Lynwood MARLA Cornea      "

## 2024-08-12 ENCOUNTER — Encounter (HOSPITAL_COMMUNITY): Payer: Self-pay | Admitting: Urology

## 2024-08-12 ENCOUNTER — Ambulatory Visit: Admitting: Neurology

## 2024-08-12 VITALS — BP 129/67 | HR 78 | Ht 64.0 in | Wt 212.0 lb

## 2024-08-12 DIAGNOSIS — R269 Unspecified abnormalities of gait and mobility: Secondary | ICD-10-CM | POA: Diagnosis not present

## 2024-08-12 DIAGNOSIS — F09 Unspecified mental disorder due to known physiological condition: Secondary | ICD-10-CM

## 2024-08-12 DIAGNOSIS — G35D Multiple sclerosis, unspecified: Secondary | ICD-10-CM

## 2024-08-12 DIAGNOSIS — Z79899 Other long term (current) drug therapy: Secondary | ICD-10-CM

## 2024-08-12 DIAGNOSIS — N319 Neuromuscular dysfunction of bladder, unspecified: Secondary | ICD-10-CM | POA: Diagnosis not present

## 2024-08-12 DIAGNOSIS — R41840 Attention and concentration deficit: Secondary | ICD-10-CM

## 2024-08-12 DIAGNOSIS — G35A Relapsing-remitting multiple sclerosis: Secondary | ICD-10-CM

## 2024-08-12 MED ORDER — MODAFINIL 200 MG PO TABS: 200.0000 mg | ORAL_TABLET | Freq: Every day | ORAL | 5 refills | Status: AC

## 2024-08-12 NOTE — Progress Notes (Signed)
 "  [facility]  PATIENT: Connie Robertson DOB: Feb 08, 1971  REFERRING DOCTOR OR PCP: Dr. Jenel SOURCE: Patient, notes from Dr. Jenel, imaging and lab reports.  _________________________________   HISTORICAL  CHIEF COMPLAINT:  Chief Complaint  Patient presents with   Multiple Sclerosis    Rm 2 alone Pt is well, reports she has been having some cognitive concerns and issues with her bladder.     HISTORY OF PRESENT ILLNESS:  She is a 54 year old woman with relapsing remitting MS currently on Tysabri  therapy  Update 08/12/2024 She swithced from Tysabri  to [facility] in 2024: Month 1 was March 2024 and month 2 was April 2024.  Second year was around April/Mat 2025.   Labs 12/10/2023 showed normal lymphocyte count of 1.2 but WBC low at 2.7.   Slightly low platelet count of 175.       She feels she is doing well physically but she feels memory has worsened.    She has insomnia..  Ambien helped her fall asleep but when she ran out, she switched to an OTC PM preparation.  She has trouble quieting her mind at night.  She does not snore.  A PSG was done at some point and she does not have OSA.  She has some anxiety and some depression.       Currently, balance is reduced but she can walk without a cane.   She could walk 1/2 mile without a break and climb 3 flights of stairs holds rail).  She has not had any falls.  Leg strength is fairly symmetric.  She sometimes has some numbness in the legs.     She has a neurogenic bladder and has had some UTIs..  She is better with Botox   She reports reduced vision, unchanged compared to the last visit.  She notes reduced cognition  - mostly reduced Focus, attention, executive function and word finding.  She needs to set up reminders as she misses appts.    Methylphenidate  has not helped.   Phentermine  helps weight but not cognition..     She has migraines and the combination of Aimovig  with Depakote  500mg  po BID , topiramate  200 mgis  helping some  She was on topiramate  in past.  She had a left torn meniscus and had surgery - she still has issues and a TKR has been discussed.  Medications tried and failed for migraine: Anti-epileptics (topiramate , gabapent, dilantin , depakote , zonisamide ), muscle relaxants, Botox , NSAIDs, tricyclics (nortriptyline ), steroids, Aimovig , trigger point injections and   MS history: She was diagnosed with MS about 25 years.   She presented right facial weakness and twitching.   Initially she was felt to have Bells Palsy.  Studies were consistent with MS.   She was initially on the interferons and glatiramer.  She saw Dr. Maurice initially and then moved to the [facility].  She switched to Gilenya  and tolerated it well.   However, she began to experience exacerbation (vision, numbness, balance).   She moved to [facility] and switched to Tysabri  in 2017 due to the breakthrough activity she has tolerated Tysabri  well..   She is JCV antibody negative last checked 01/24/2021.   Possible small exacerbation vs fluctuation 03/2021.     Fluctuation vs. Small relapse with legs heavy 03/2022.      Migraine history: She has a long history of chronic migraine.  She currently is seeing Dr. Oneita.  Injections have not helped.  She did not get a benefit from Botox  or Aimovig .  She  is on Depakote  and Topamax  without much benefit.  When migraine occurs she notes visual blurring.   Imaging:  MRI of the brain 05/26/2020 shows multiple T2/FLAIR hyperintense foci in the periventricular, juxtacortical, and deep white matter of the hemispheres and in the pons and cerebellum.  There was no change compared to 05/27/2019.   MRI of the cervical spine 05/26/2020 showed multiple foci within the spinal cord.  There is a large T2 hyperintense focus posteriorly to the left adjacent to C3 and smaller foci laterally to the right adjacent to C3, laterally to the left adjacent to C3, laterally to the right adjacent to C4, laterally to the left  adjacent to C6, laterally to the left adjacent to C7, posteriorly adjacent to C7-T1 and laterally to the right adjacent to T1.  No change compared to the MRI 09/05/2018.  She also had mild degenerative changes at C5-C6 and C6-C7 but no nerve root compression or spinal stenosis.  REVIEW OF SYSTEMS: Constitutional: No fevers, chills, sweats, or change in appetite Eyes: No visual changes, double vision, eye pain Ear, nose and throat: No hearing loss, ear pain, nasal congestion, sore throat Cardiovascular: No chest pain, palpitations Respiratory:  No shortness of breath at rest or with exertion.   No wheezes GastrointestinaI: No nausea, vomiting, diarrhea, abdominal pain, fecal incontinence Genitourinary:  No dysuria, urinary retention or frequency.  No nocturia. Musculoskeletal:  No neck pain, back pain Integumentary: No rash, pruritus, skin lesions Neurological: as above Psychiatric: No depression at this time.  No anxiety Endocrine: No palpitations, diaphoresis, change in appetite, change in weigh or increased thirst Hematologic/Lymphatic:  No anemia, purpura, petechiae. Allergic/Immunologic: No itchy/runny eyes, nasal congestion, recent allergic reactions, rashes  ALLERGIES: No Known Allergies  HOME MEDICATIONS:  Current Outpatient Medications:    AIMOVIG  140 MG/ML SOAJ, ONE PEN SUBCUTANEOUS EVERY 4 WEEKS, Disp: 1 mL, Rfl: 12   divalproex  (DEPAKOTE ) 500 MG DR tablet, TAKE 1 TABLET(500 MG) BY MOUTH TWICE DAILY, Disp: 60 tablet, Rfl: 5   lisinopril -hydrochlorothiazide (ZESTORETIC) 10-12.5 MG tablet, Take 1 tablet by mouth in the morning., Disp: , Rfl:    meloxicam  (MOBIC ) 15 MG tablet, Take 15 mg by mouth daily as needed for pain., Disp: , Rfl:    mirabegron  ER (MYRBETRIQ ) 50 MG TB24 tablet, Take 1 tablet (50 mg total) by mouth daily., Disp: 90 tablet, Rfl: 0   phentermine  (ADIPEX-P ) 37.5 MG tablet, Take 37.5 mg by mouth daily before breakfast., Disp: , Rfl:    progesterone (PROMETRIUM)  100 MG capsule, Take 100 mg by mouth at bedtime., Disp: , Rfl:    topiramate  (TOPAMAX ) 200 MG tablet, Take 0.5 tablets (100 mg total) by mouth daily., Disp: 60 tablet, Rfl: 1   traZODone  (DESYREL ) 100 MG tablet, Take 100 mg by mouth at bedtime., Disp: , Rfl:    valACYclovir  (VALTREX ) 1000 MG tablet, Take 1,000 mg by mouth at bedtime., Disp: , Rfl:    zolpidem (AMBIEN) 10 MG tablet, Take 10 mg by mouth at bedtime as needed for sleep., Disp: , Rfl:    busPIRone  (BUSPAR ) 15 MG tablet, Take 1 tablet (15 mg total) by mouth 2 (two) times daily. (Patient not taking: Reported on 08/01/2024), Disp: 60 tablet, Rfl: 5   Cladribine, 8 Tabs, (MAVENCLAD, 8 TABS,) 10 MG TBPK, Take by mouth. (Patient not taking: Reported on 08/01/2024), Disp: , Rfl:    methylphenidate  (RITALIN ) 10 MG tablet, One po qAM and one po about 4 hours later (Patient not taking: Reported on 01/09/2024), Disp:  60 tablet, Rfl: 0  PAST MEDICAL HISTORY: Past Medical History:  Diagnosis Date   Abnormality of gait 11/25/2014   Arthritis    Chronic daily headache 2003   Love   Depression    Hypertension    Memory disorder 02/29/2016   MS (multiple sclerosis) 1992   Dr. Maurice   Neurogenic bladder 11/21/2013   Neuromuscular disorder (HCC)     PAST SURGICAL HISTORY: Past Surgical History:  Procedure Laterality Date   ABLATION COLPOCLESIS  08/07/2002   BOTOX  INJECTION N/A 07/13/2023   Procedure: CYSTOSCOPY BOTOX  INJECTION;  Surgeon: Carolee Sherwood JONETTA DOUGLAS, MD;  Location: [facility];  Service: Urology;  Laterality: N/A;  30 MINS FOR CASE   CESAREAN SECTION     x 2   CYSTOSCOPY WITH INJECTION N/A 02/01/2024   Procedure: CYSTOSCOPY, WITH INJECTION OF BLADDER NECK OR BLADDER WALL;  Surgeon: Carolee Sherwood JONETTA DOUGLAS, MD;  Location: [facility];  Service: Urology;  Laterality: N/A;  INJECT 100 UNITS OF BOTOX    CYSTOSCOPY WITH INJECTION N/A 08/11/2024   Procedure: CYSTOSCOPY, WITH INJECTION OF BLADDER NECK OR BLADDER WALL;  Surgeon: Carolee Sherwood JONETTA DOUGLAS, MD;   Location: [facility];  Service: Urology;  Laterality: N/A;  INJECT 100 UNITS OF INTRAVESICAL BOTOX    TUBAL LIGATION      FAMILY HISTORY: Family History  Problem Relation Age of Onset   Heart failure Father    Multiple sclerosis Sister    Cancer Neg Hx    Diabetes Neg Hx    Hyperlipidemia Neg Hx    Hypertension Neg Hx    Kidney disease Neg Hx    Stroke Neg Hx    Breast cancer Neg Hx     SOCIAL HISTORY:  Social History   Socioeconomic History   Marital status: Divorced    Spouse name: Not on file   Number of children: 2   Years of education: college   Highest education level: Not on file  Occupational History   Occupation: disability  Tobacco Use   Smoking status: Never   Smokeless tobacco: Never  Vaping Use   Vaping status: Never Used  Substance and Sexual Activity   Alcohol use: Yes    Comment: social   Drug use: No   Sexual activity: Yes    Birth control/protection: Surgical  Other Topics Concern   Not on file  Social History Narrative   Patient is right handed.   Patient drinks 2 cups of coffee daily.   Social Drivers of Health   Tobacco Use: Low Risk (08/12/2024)   Patient History    Smoking Tobacco Use: Never    Smokeless Tobacco Use: Never    Passive Exposure: Not on file  Financial Resource Strain: Not on file  Food Insecurity: Not on file  Transportation Needs: Not on file  Physical Activity: Not on file  Stress: Not on file  Social Connections: Not on file  Intimate Partner Violence: Not on file  Depression (EYV7-0): Not on file  Alcohol Screen: Not on file  Housing: Not on file  Utilities: Not on file  Health Literacy: Not on file     PHYSICAL EXAM  There were no vitals filed for this visit.   There is no height or weight on file to calculate BMI.   General: The patient is well-developed and well-nourished and in no acute distress  HEENT:  Head is [place]/AT.  Sclera are anicteric.      Skin: Extremities are without rash or   edema.  Neurologic Exam  Mental status: The patient is alert and oriented x 3 at the time of the examination. The patient has apparent normal recent and remote memory.  She was distractible with reduced focus/attention.SABRA   Speech is normal.  Cranial nerves: Extraocular movements are full.  Color vision is reduced on the right  Visual acuity was more symmetric.  Facial symmetry is present. There is good facial sensation to soft touch bilaterally.Facial strength is normal.  Trapezius and sternocleidomastoid strength is normal. No dysarthria is noted.   No obvious hearing deficits are noted.  Motor:  Muscle bulk is normal.   Tone is normal. Strength is  5 / 5 in all 4 extremities.   Sensory: Sensory testing is intact to pinprick, soft touch and vibration sensation in the arms.  She had reduced sensation to touch and vibration in the left leg.  Coordination: Cerebellar testing reveals good finger-nose-finger and heel-to-shin bilaterally.  Gait and station: Station is normal.   She has a wide gait.  Does not need cane.  She has a reduced tandem gait but better than last time.   Her Romberg is borderline  Reflexes: Deep tendon reflexes are symmetric and normal bilaterally.  No ankle clonus     DIAGNOSTIC DATA (LABS, IMAGING, TESTING) - I reviewed patient records, labs, notes, testing and imaging myself where available.  Lab Results  Component Value Date   WBC 3.3 (L) 08/01/2024   HGB 12.3 08/01/2024   HCT 36.8 08/01/2024   MCV 94.6 08/01/2024   PLT 147 (L) 08/01/2024      Component Value Date/Time   NA 145 08/01/2024 1330   NA 143 10/23/2023 0951   NA 142 04/05/2015 1435   K 4.1 08/01/2024 1330   K 3.9 04/05/2015 1435   CL 107 08/01/2024 1330   CO2 30 08/01/2024 1330   CO2 24 04/05/2015 1435   GLUCOSE 88 08/01/2024 1330   GLUCOSE 89 04/05/2015 1435   BUN 24 (H) 08/01/2024 1330   BUN 17 10/23/2023 0951   BUN 18.2 04/05/2015 1435   CREATININE 0.98 08/01/2024 1330   CREATININE  0.96 12/11/2022 0000   CREATININE 1.1 04/05/2015 1435   CALCIUM 9.5 08/01/2024 1330   CALCIUM 9.3 04/05/2015 1435   PROT 6.7 10/23/2023 0951   PROT 6.9 04/05/2015 1435   ALBUMIN 4.5 10/23/2023 0951   ALBUMIN 4.2 04/05/2015 1435   AST 19 10/23/2023 0951   AST 27 04/05/2015 1435   ALT 14 10/23/2023 0951   ALT 46 04/05/2015 1435   ALKPHOS 59 10/23/2023 0951   ALKPHOS 53 04/05/2015 1435   BILITOT <0.2 10/23/2023 0951   BILITOT 0.22 04/05/2015 1435   GFRNONAA >60 08/01/2024 1330   GFRAA 88 06/01/2020 1112   Lab Results  Component Value Date   CHOL 214 (H) 12/11/2022   HDL 69 12/11/2022   LDLCALC 129 (H) 12/11/2022   TRIG 67 12/11/2022   CHOLHDL 3.1 12/11/2022   No results found for: HGBA1C Lab Results  Component Value Date   VITAMINB12 438 12/11/2022   Lab Results  Component Value Date   TSH 0.48 12/11/2022       ASSESSMENT AND PLAN  Relapsing remitting multiple sclerosis - Plan: CBC with Differential/Platelet  High risk medication use - Plan: CBC with Differential/Platelet  Neurogenic bladder  Abnormality of gait  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  We will recheck CBC with differential  . Cognition  is affected from the MS.  This has led to disability.   Consider neurocognitive testing if continues to worsen.   Trial of modafinil  for her MS fatigue and sleepiness Stay active and exercise as tolerated. Continue anti-CGRP treatment along with Depakote  for migraines. 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   RTC in 6  months but she should call sooner if new or worsening symptoms.  This visit is part of a comprehensive longitudinal care medical relationship regarding the patients primary diagnosis of MS and related concerns.   Fadel Clason A. Vear, MD, [facility] 08/12/2024, 11:44 AM Certified in Neurology, Clinical Neurophysiology, Sleep Medicine and  Neuroimaging  [facility] [address] [place], [place] [address] [phone] "

## 2024-08-13 ENCOUNTER — Other Ambulatory Visit (HOSPITAL_COMMUNITY): Payer: Self-pay

## 2024-08-13 ENCOUNTER — Telehealth: Payer: Self-pay

## 2024-08-13 ENCOUNTER — Ambulatory Visit: Payer: Self-pay | Admitting: Neurology

## 2024-08-13 LAB — CBC WITH DIFFERENTIAL/PLATELET
Basophils Absolute: 0 x10E3/uL (ref 0.0–0.2)
Basos: 0 %
EOS (ABSOLUTE): 0 x10E3/uL (ref 0.0–0.4)
Eos: 0 %
Hematocrit: 36.3 % (ref 34.0–46.6)
Hemoglobin: 12.1 g/dL (ref 11.1–15.9)
Immature Grans (Abs): 0 x10E3/uL (ref 0.0–0.1)
Immature Granulocytes: 0 %
Lymphocytes Absolute: 1 x10E3/uL (ref 0.7–3.1)
Lymphs: 15 %
MCH: 32.2 pg (ref 26.6–33.0)
MCHC: 33.3 g/dL (ref 31.5–35.7)
MCV: 97 fL (ref 79–97)
Monocytes Absolute: 0.5 x10E3/uL (ref 0.1–0.9)
Monocytes: 7 %
Neutrophils Absolute: 5.2 x10E3/uL (ref 1.4–7.0)
Neutrophils: 78 %
Platelets: 159 x10E3/uL (ref 150–450)
RBC: 3.76 x10E6/uL — ABNORMAL LOW (ref 3.77–5.28)
RDW: 12.9 % (ref 11.7–15.4)
WBC: 6.7 x10E3/uL (ref 3.4–10.8)

## 2024-08-13 NOTE — Telephone Encounter (Signed)
 Pharmacy Patient Advocate Encounter   Received notification from Fax that prior authorization for Modafinil  is required/requested.   Insurance verification completed.   The patient is insured through [facility].   Per test claim: PA required; PA submitted to above mentioned insurance via Latent Key/confirmation #/EOC [facility] Status is pending

## 2024-08-13 NOTE — Telephone Encounter (Signed)
 Pharmacy Patient Advocate Encounter  Received notification from [employer] that Prior Authorization for Modafinil  has been APPROVED from 08/13/2024 to 02/10/2025. Ran test claim, Copay is $1.60. This test claim was processed through [facility]- copay amounts may vary at other pharmacies due to pharmacy/plan contracts, or as the patient moves through the different stages of their insurance plan.   PA #/Case ID/Reference #: [id number]

## 2024-09-07 ENCOUNTER — Other Ambulatory Visit: Payer: Self-pay | Admitting: Neurology

## 2024-09-07 DIAGNOSIS — N319 Neuromuscular dysfunction of bladder, unspecified: Secondary | ICD-10-CM

## 2024-09-07 DIAGNOSIS — G35A Relapsing-remitting multiple sclerosis: Secondary | ICD-10-CM

## 2024-09-10 ENCOUNTER — Other Ambulatory Visit: Payer: Self-pay | Admitting: Neurology

## 2024-09-10 ENCOUNTER — Telehealth: Payer: Self-pay | Admitting: Neurology

## 2024-09-10 MED ORDER — SERTRALINE HCL 50 MG PO TABS: 50.0000 mg | ORAL_TABLET | Freq: Every day | ORAL | 3 refills | Status: AC

## 2024-09-10 NOTE — Telephone Encounter (Signed)
 Pt called to request  to speak to Nurse about getting  some medication to calm Pt down .SABRA Pt stated  her neves are really bad and need  something to calm her down

## 2024-09-10 NOTE — Telephone Encounter (Signed)
 Called and made pt aware. Pt voiced gratitude and understanding

## 2024-09-10 NOTE — Telephone Encounter (Signed)
 Returned call to pt who stated that he anxiety is really high and wanted to know if Dr. Vear could give her something to help calm her down and not be this was. Pt stated that she had an episode of incontinence last night and its because her mother was diagnosed w/lung cancer and is causing extra stress. Pt doesn't want to have an ms exacerbation and thinks a pill for anxiety may calm her down.

## 2025-04-15 ENCOUNTER — Ambulatory Visit: Admitting: Neurology
